# Patient Record
Sex: Female | Born: 1965 | Race: White | Hispanic: No | Marital: Single | Smoking: Current every day smoker
Health system: Southern US, Community
[De-identification: ages and names within clinical notes are randomized; demographics above are authoritative.]

## PROBLEM LIST (undated history)

## (undated) DIAGNOSIS — Q249 Congenital malformation of heart, unspecified: Secondary | ICD-10-CM

## (undated) DIAGNOSIS — I219 Acute myocardial infarction, unspecified: Secondary | ICD-10-CM

## (undated) DIAGNOSIS — N200 Calculus of kidney: Secondary | ICD-10-CM

## (undated) DIAGNOSIS — R011 Cardiac murmur, unspecified: Secondary | ICD-10-CM

## (undated) DIAGNOSIS — Q211 Atrial septal defect: Secondary | ICD-10-CM

## (undated) DIAGNOSIS — Q2119 Other specified atrial septal defect: Secondary | ICD-10-CM

## (undated) HISTORY — DX: Congenital malformation of heart, unspecified: Q24.9

## (undated) HISTORY — DX: Calculus of kidney: N20.0

## (undated) HISTORY — DX: Cardiac murmur, unspecified: R01.1

## (undated) HISTORY — PX: CORONARY ANGIOPLASTY: SHX604

## (undated) HISTORY — DX: Other specified atrial septal defect: Q21.19

## (undated) HISTORY — DX: Acute myocardial infarction, unspecified: I21.9

## (undated) HISTORY — PX: WISDOM TOOTH EXTRACTION: SHX21

## (undated) HISTORY — DX: Atrial septal defect: Q21.1

---

## 1997-12-28 HISTORY — PX: OTHER SURGICAL HISTORY: SHX169

## 2004-12-22 ENCOUNTER — Emergency Department: Payer: Self-pay | Admitting: Emergency Medicine

## 2004-12-22 ENCOUNTER — Other Ambulatory Visit: Payer: Self-pay

## 2005-12-28 LAB — CONVERTED CEMR LAB: Pap Smear: NORMAL

## 2006-03-04 ENCOUNTER — Ambulatory Visit: Payer: Self-pay

## 2006-03-18 ENCOUNTER — Emergency Department: Payer: Self-pay | Admitting: Internal Medicine

## 2006-03-18 ENCOUNTER — Other Ambulatory Visit: Payer: Self-pay

## 2009-05-20 ENCOUNTER — Emergency Department: Payer: Self-pay | Admitting: Emergency Medicine

## 2009-07-09 ENCOUNTER — Other Ambulatory Visit: Admission: RE | Admit: 2009-07-09 | Discharge: 2009-07-09 | Payer: Self-pay | Admitting: Family Medicine

## 2009-07-09 ENCOUNTER — Encounter: Payer: Self-pay | Admitting: Family Medicine

## 2009-07-09 ENCOUNTER — Ambulatory Visit: Payer: Self-pay | Admitting: Family Medicine

## 2009-07-09 DIAGNOSIS — Z87442 Personal history of urinary calculi: Secondary | ICD-10-CM

## 2009-07-09 DIAGNOSIS — N949 Unspecified condition associated with female genital organs and menstrual cycle: Secondary | ICD-10-CM | POA: Insufficient documentation

## 2009-07-09 DIAGNOSIS — R5381 Other malaise: Secondary | ICD-10-CM

## 2009-07-09 DIAGNOSIS — R5383 Other fatigue: Secondary | ICD-10-CM

## 2009-07-09 DIAGNOSIS — K219 Gastro-esophageal reflux disease without esophagitis: Secondary | ICD-10-CM

## 2009-07-09 DIAGNOSIS — F418 Other specified anxiety disorders: Secondary | ICD-10-CM | POA: Insufficient documentation

## 2009-07-10 LAB — CONVERTED CEMR LAB
Basophils Relative: 0.3 % (ref 0.0–3.0)
Chloride: 106 meq/L (ref 96–112)
Creatinine, Ser: 0.7 mg/dL (ref 0.4–1.2)
Direct LDL: 130.4 mg/dL
Eosinophils Relative: 0.7 % (ref 0.0–5.0)
Hemoglobin: 15.1 g/dL — ABNORMAL HIGH (ref 12.0–15.0)
Lymphocytes Relative: 18.4 % (ref 12.0–46.0)
MCV: 95 fL (ref 78.0–100.0)
Neutro Abs: 8.4 10*3/uL — ABNORMAL HIGH (ref 1.4–7.7)
Neutrophils Relative %: 74 % (ref 43.0–77.0)
RBC: 4.54 M/uL (ref 3.87–5.11)
Sodium: 139 meq/L (ref 135–145)
WBC: 11.4 10*3/uL — ABNORMAL HIGH (ref 4.5–10.5)

## 2009-07-11 LAB — CONVERTED CEMR LAB: Herpes Simplex Vrs I&II-IgM Ab (EIA): 0.44

## 2009-07-15 ENCOUNTER — Encounter (INDEPENDENT_AMBULATORY_CARE_PROVIDER_SITE_OTHER): Payer: Self-pay | Admitting: *Deleted

## 2009-10-16 ENCOUNTER — Telehealth: Payer: Self-pay | Admitting: Family Medicine

## 2010-05-22 ENCOUNTER — Telehealth: Payer: Self-pay | Admitting: Family Medicine

## 2010-08-11 ENCOUNTER — Telehealth: Payer: Self-pay | Admitting: Family Medicine

## 2011-01-27 NOTE — Progress Notes (Signed)
Summary: ? IUD removal  Phone Note Call from Patient Call back at Home Phone [phone]   Caller: Patient Call For: Angela Beat MD Summary of Call: Patient has an appt on 06/24/2010 for a CPX with Dr. Patsy Lager and wants to know if he can remove her IUD.  She says it is a 10 year IUD and she cannot feel the string at all.  Please advise.  She does not have a GYN. Initial call taken by: Linde Gillis CMA Duncan Dull),  May 22, 2010 4:32 PM  Follow-up for Phone Call        If she cannot feel the string at all, then I would rather have one of the GYN's remove it.   It is not hard to do, but without an easily seen string becomes harder.   Multiple GYN's that are nice. If [place], [place] [facility] is good. [facility] is nice. I think she lives in [place] -- [place] has GYN clinics, too. Follow-up by: Angela Beat MD,  May 22, 2010 7:27 PM  Additional Follow-up for Phone Call Additional follow up Details #1::        Spoke with patient and she says that she will let you do the pap first and see if you can see the string because everytime she had had it cheked they have been able to see it fine and if you cant do it then she would like the referral to gyn Additional Follow-up by: Benny Lennert CMA [facility] Dull),  May 23, 2010 7:43 AM    Additional Follow-up for Phone Call Additional follow up Details #2::    OK, I am fine with that. Please schedule. Follow-up by: Angela Beat MD,  May 26, 2010 12:20 PM  Additional Follow-up for Phone Call Additional follow up Details #3:: Details for Additional Follow-up Action Taken: already has appt Additional Follow-up by: Benny Lennert CMA [facility] Dull),  May 27, 2010 7:48 AM

## 2011-01-27 NOTE — Progress Notes (Signed)
Summary: wants IUD removed  Phone Note Call from Patient Call back at Home Phone [phone]   Caller: Patient Call For: Angela Beat MD Summary of Call: Pt wants referral to gyn for IUD removal, she wants this out.  Prefers to go to [place] Initial call taken by: Lowella Petties CMA,  August 11, 2010 3:57 PM  Follow-up for Phone Call        ok, referring to [facility] Follow-up by: Angela Beat MD,  August 11, 2010 4:01 PM  New Problems: INTRAUTERINE CONTRACEPTIVE DEVICE REMOVAL (ICD-V25.42)   New Problems: INTRAUTERINE CONTRACEPTIVE DEVICE REMOVAL (ICD-V25.42)

## 2015-10-03 ENCOUNTER — Ambulatory Visit (INDEPENDENT_AMBULATORY_CARE_PROVIDER_SITE_OTHER): Payer: BC Managed Care – PPO | Admitting: Family Medicine

## 2015-10-03 ENCOUNTER — Encounter: Payer: Self-pay | Admitting: Family Medicine

## 2015-10-03 VITALS — BP 113/75 | HR 82 | Temp 97.8°F | Resp 16 | Ht 62.0 in | Wt 180.2 lb

## 2015-10-03 DIAGNOSIS — Z720 Tobacco use: Secondary | ICD-10-CM

## 2015-10-03 DIAGNOSIS — J189 Pneumonia, unspecified organism: Secondary | ICD-10-CM

## 2015-10-03 DIAGNOSIS — J181 Lobar pneumonia, unspecified organism: Principal | ICD-10-CM

## 2015-10-03 DIAGNOSIS — F17200 Nicotine dependence, unspecified, uncomplicated: Secondary | ICD-10-CM | POA: Insufficient documentation

## 2015-10-03 MED ORDER — DOXYCYCLINE HYCLATE 100 MG PO CAPS: 100.0000 mg | ORAL_CAPSULE | Freq: Two times a day (BID) | ORAL | Status: DC

## 2015-10-03 MED ORDER — GUAIFENESIN-CODEINE 100-10 MG/5ML PO SOLN: 5.0000 mL | Freq: Four times a day (QID) | ORAL | Status: DC | PRN

## 2015-10-03 NOTE — Progress Notes (Signed)
Date:  10/03/2015   Name:  Angela Mccullough   DOB:  [birth year]   MRN:  [id number]  PCP:  Leata Mouse, NP    Chief Complaint: Cough   History of Present Illness:  This is a 49 y.o. female with 2 weeks of NP cough and wheezing, Ventolin MDI helps but using frequently, cough keeping up at night. Allergic Biaxin.  Review of Systems:  Review of Systems  Constitutional: Negative for fever.  HENT: Negative for rhinorrhea and sore throat.     Patient Active Problem List   Diagnosis Date Noted  . ANXIETY 07/09/2009  . DEPRESSION 07/09/2009  . GERD 07/09/2009  . VAGINITIS 07/09/2009  . DYSFUNCTIONAL UTERINE BLEEDING 07/09/2009  . FATIGUE 07/09/2009  . NEPHROLITHIASIS, HX OF 07/09/2009    Prior to Admission medications   Medication Sig Start Date End Date Taking? Authorizing Provider  albuterol (PROVENTIL) (2.5 MG/3ML) 0.083% nebulizer solution Take 2.5 mg by nebulization every 6 (six) hours as needed for wheezing or shortness of breath.   Yes Historical Provider, MD  cetirizine (ZYRTEC) 10 MG tablet Take 10 mg by mouth daily.   Yes Historical Provider, MD  diphenhydrAMINE (SOMINEX) 25 MG tablet Take 25 mg by mouth at bedtime as needed for sleep.   Yes Historical Provider, MD  doxycycline (VIBRAMYCIN) 100 MG capsule Take 1 capsule (100 mg total) by mouth 2 (two) times daily. 10/03/15   Adline Potter, MD  guaiFENesin-codeine 100-10 MG/5ML syrup Take 5 mLs by mouth every 6 (six) hours as needed for cough. 10/03/15   Adline Potter, MD    Allergies  Allergen Reactions  . Vioxx [Rofecoxib] Nausea Only    No past surgical history on file.  Social History  Substance Use Topics  . Smoking status: Current Every Day Smoker    Types: Cigarettes  . Smokeless tobacco: Not on file  . Alcohol Use: No    No family history on file.  Medication list has been reviewed and updated.  Physical Examination: BP 113/75 mmHg  Pulse 82  Temp(Src) 97.8 F (36.6 C) (Oral)  Resp 16  Ht 5\' 2"   (1.575 m)  Wt 180 lb 3.2 oz (81.738 kg)  BMI 32.95 kg/m2  SpO2 95%  Physical Exam  HENT:  Mouth/Throat: Oropharynx is clear and moist.  Pulmonary/Chest: Effort normal.  Rales RLL with R sided expiratory rhonchi    Assessment and Plan:  1. Right lower lobe pneumonia With bronchospasm - doxycycline (VIBRAMYCIN) 100 MG capsule; Take 1 capsule (100 mg total) by mouth 2 (two) times daily.  Dispense: 14 capsule; Refill: 0 - guaiFENesin-codeine 100-10 MG/5ML syrup; Take 5 mLs by mouth every 6 (six) hours as needed for cough.  Dispense: 120 mL; Refill: 0  2. Smoker Recommend cessation  Return if symptoms worsen or fail to improve.  Satira Anis. Chriss Mannan, [facility]  10/03/2015

## 2015-10-10 ENCOUNTER — Telehealth: Payer: Self-pay | Admitting: Family Medicine

## 2015-10-10 NOTE — Telephone Encounter (Signed)
Pt advised.

## 2015-10-10 NOTE — Telephone Encounter (Signed)
Pt was here last week with cough and congestion.  She will finish all medications today and still has a productive cough.  Please call [phone] and leave a message.  She doesn't get off work until 3:00.

## 2015-10-10 NOTE — Telephone Encounter (Signed)
If feeling better needs to give more time as cough is last symptom to resolve. If not feeling better needs office visit and CXR.

## 2015-10-10 NOTE — Telephone Encounter (Signed)
Please advise 

## 2015-10-14 ENCOUNTER — Encounter: Payer: Self-pay | Admitting: Family Medicine

## 2015-10-14 ENCOUNTER — Ambulatory Visit
Admission: RE | Admit: 2015-10-14 | Discharge: 2015-10-14 | Disposition: A | Discharge status: Home / Self Care | Payer: BC Managed Care – PPO | Source: Ambulatory Visit | Attending: Family Medicine | Admitting: Family Medicine

## 2015-10-14 ENCOUNTER — Ambulatory Visit (INDEPENDENT_AMBULATORY_CARE_PROVIDER_SITE_OTHER): Payer: BC Managed Care – PPO | Admitting: Family Medicine

## 2015-10-14 VITALS — BP 107/72 | HR 78 | Temp 98.4°F | Resp 16 | Ht 62.0 in | Wt 182.0 lb

## 2015-10-14 DIAGNOSIS — J189 Pneumonia, unspecified organism: Secondary | ICD-10-CM | POA: Insufficient documentation

## 2015-10-14 DIAGNOSIS — Z720 Tobacco use: Secondary | ICD-10-CM | POA: Diagnosis not present

## 2015-10-14 DIAGNOSIS — J181 Lobar pneumonia, unspecified organism: Principal | ICD-10-CM

## 2015-10-14 DIAGNOSIS — F17200 Nicotine dependence, unspecified, uncomplicated: Secondary | ICD-10-CM

## 2015-10-14 MED ORDER — PREDNISONE 20 MG PO TABS: 20.0000 mg | ORAL_TABLET | Freq: Every day | ORAL | Status: DC

## 2015-10-14 NOTE — Progress Notes (Signed)
Date:  10/14/2015   Name:  Angela Mccullough   DOB:  11-22-1966   MRN:  [id number]  PCP:  Leata Mouse, NP    Chief Complaint: Pneumonia   History of Present Illness:  This is a 49 y.o. female with clinical dx of RLL pneumonia 11 days ago rx'd with doxy x 7d. Overall better but still has NP cough, wheezing (using albuterol MDI regularly), and fatigue. Has cut back on smoking but not quit.  Review of Systems:  Review of Systems  Constitutional: Negative for fever and chills.  HENT: Negative for rhinorrhea and sore throat.   Cardiovascular: Negative for chest pain and leg swelling.    Patient Active Problem List   Diagnosis Date Noted  . Smoker 10/03/2015  . ANXIETY 07/09/2009  . DEPRESSION 07/09/2009  . GERD 07/09/2009  . VAGINITIS 07/09/2009  . DYSFUNCTIONAL UTERINE BLEEDING 07/09/2009  . FATIGUE 07/09/2009  . NEPHROLITHIASIS, HX OF 07/09/2009    Prior to Admission medications   Medication Sig Start Date End Date Taking? Authorizing Provider  albuterol (PROVENTIL) (2.5 MG/3ML) 0.083% nebulizer solution Take 2.5 mg by nebulization every 6 (six) hours as needed for wheezing or shortness of breath.   Yes Historical Provider, MD  cetirizine (ZYRTEC) 10 MG tablet Take 10 mg by mouth daily.   Yes Historical Provider, MD  doxycycline (VIBRAMYCIN) 100 MG capsule Take 1 capsule (100 mg total) by mouth 2 (two) times daily. 10/03/15  Yes Adline Potter, MD  guaiFENesin-codeine 100-10 MG/5ML syrup Take 5 mLs by mouth every 6 (six) hours as needed for cough. 10/03/15  Yes Adline Potter, MD  predniSONE (DELTASONE) 20 MG tablet Take 1 tablet (20 mg total) by mouth daily with breakfast. 10/14/15   Adline Potter, MD    Allergies  Allergen Reactions  . Biaxin [Clarithromycin] Nausea And Vomiting    No past surgical history on file.  Social History  Substance Use Topics  . Smoking status: Current Every Day Smoker    Types: Cigarettes  . Smokeless tobacco: Not on file  . Alcohol Use: No     No family history on file.  Medication list has been reviewed and updated.  Physical Examination: BP 107/72 mmHg  Pulse 78  Temp(Src) 98.4 F (36.9 C) (Oral)  Resp 16  Ht 5\' 2"  (1.575 m)  Wt 182 lb (82.555 kg)  BMI 33.28 kg/m2  SpO2 97%  Physical Exam  Constitutional: She appears well-developed and well-nourished.  HENT:  Mouth/Throat: Oropharynx is clear and moist.  Cardiovascular: Normal rate, regular rhythm and normal heart sounds.   Pulmonary/Chest: Effort normal and breath sounds normal.  Musculoskeletal: She exhibits no edema.  Lymphadenopathy:    She has no cervical adenopathy.  Neurological: She is alert.  Skin: Skin is warm and dry.  Psychiatric: She has a normal mood and affect. Her behavior is normal.  Nursing note and vitals reviewed.   Assessment and Plan:  1. Pneumonia of right lower lobe due to infectious organism CXR shows no active disease, persistent sxs likely due to residual inflammation, trial short course prednisone (tolerated in past) - DG Chest 2 View  2. Smoker Strongly advised cessation  Return if symptoms worsen or fail to improve.  Satira Anis. [facility]  10/14/2015

## 2015-11-08 ENCOUNTER — Encounter: Payer: BC Managed Care – PPO | Admitting: Family Medicine

## 2015-11-08 ENCOUNTER — Ambulatory Visit: Payer: BC Managed Care – PPO | Admitting: Family Medicine

## 2015-11-20 ENCOUNTER — Ambulatory Visit (INDEPENDENT_AMBULATORY_CARE_PROVIDER_SITE_OTHER): Payer: BC Managed Care – PPO | Admitting: Family Medicine

## 2015-11-20 ENCOUNTER — Encounter: Payer: Self-pay | Admitting: Family Medicine

## 2015-11-20 VITALS — BP 121/81 | HR 91 | Temp 98.1°F | Resp 16 | Ht 62.0 in | Wt 177.0 lb

## 2015-11-20 DIAGNOSIS — R05 Cough: Secondary | ICD-10-CM

## 2015-11-20 DIAGNOSIS — Z0000 Encounter for general adult medical examination without abnormal findings: Secondary | ICD-10-CM | POA: Diagnosis not present

## 2015-11-20 DIAGNOSIS — F4310 Post-traumatic stress disorder, unspecified: Secondary | ICD-10-CM | POA: Diagnosis not present

## 2015-11-20 DIAGNOSIS — Z23 Encounter for immunization: Secondary | ICD-10-CM | POA: Diagnosis not present

## 2015-11-20 DIAGNOSIS — R053 Chronic cough: Secondary | ICD-10-CM | POA: Insufficient documentation

## 2015-11-20 MED ORDER — FLUTICASONE PROPIONATE HFA 110 MCG/ACT IN AERO: 1.0000 | INHALATION_SPRAY | Freq: Two times a day (BID) | RESPIRATORY_TRACT | Status: DC

## 2015-11-20 MED ORDER — ALBUTEROL SULFATE HFA 108 (90 BASE) MCG/ACT IN AERS: 2.0000 | INHALATION_SPRAY | Freq: Four times a day (QID) | RESPIRATORY_TRACT | Status: DC | PRN

## 2015-11-20 NOTE — Progress Notes (Signed)
Name: Angela Mccullough   MRN: [id number]    DOB: July 13, 1966   Date:11/20/2015       Progress Note  Subjective  Chief Complaint  Chief Complaint  Patient presents with  . Annual Exam    HPI Here for annual physical exam.  Recently recovering from pneumonia.  Continues to smoke 3/4 ppd.Sees Dr. Randel Books at [facility] for PTSD.  Continues to follow.  She has seen GYN at [facility].  Haesn't been in  Several yrs.   No mammogram in  Several years.  No problem-specific assessment & plan notes found for this encounter.   History reviewed. No pertinent past medical history.  History reviewed. No pertinent past surgical history.  Family History  Problem Relation Age of Onset  . Diabetes Mother   . COPD Mother     Social History   Social History  . Marital Status: Single    Spouse Name: N/A  . Number of Children: N/A  . Years of Education: N/A   Occupational History  . Not on file.   Social History Main Topics  . Smoking status: Current Every Day Smoker    Types: Cigarettes  . Smokeless tobacco: Never Used  . Alcohol Use: No  . Drug Use: No  . Sexual Activity: Not on file   Other Topics Concern  . Not on file   Social History Narrative     Current outpatient prescriptions:  .  cetirizine (ZYRTEC) 10 MG tablet, Take 10 mg by mouth daily., Disp: , Rfl:  .  citalopram (CELEXA) 20 MG tablet, Take 20 mg by mouth daily., Disp: , Rfl:  .  diphenhydrAMINE (BENADRYL) 25 MG tablet, Take 25 mg by mouth every 4 (four) hours as needed., Disp: , Rfl:  .  albuterol (PROVENTIL HFA;VENTOLIN HFA) 108 (90 BASE) MCG/ACT inhaler, Inhale 2 puffs into the lungs every 6 (six) hours as needed for wheezing or shortness of breath., Disp: 1 Inhaler, Rfl: 12 .  fluticasone (FLOVENT HFA) 110 MCG/ACT inhaler, Inhale 1 puff into the lungs 2 (two) times daily., Disp: 1 Inhaler, Rfl: 12  Allergies  Allergen Reactions  . Biaxin [Clarithromycin] Nausea And Vomiting     Review of Systems   Constitutional: Positive for malaise/fatigue (tired "all the time"). Negative for fever, chills and weight loss.  HENT: Negative for hearing loss.   Eyes: Negative for blurred vision and double vision.  Respiratory: Positive for cough, shortness of breath and wheezing. Sputum production: +/-, clear.   Cardiovascular: Negative for chest pain, palpitations and leg swelling.  Gastrointestinal: Negative for heartburn, abdominal pain and blood in stool.  Genitourinary: Negative for dysuria, urgency and frequency.  Skin: Negative for rash.  Neurological: Negative for dizziness, tremors, weakness and headaches.  Psychiatric/Behavioral: Positive for depression (PTSD).      Objective  Filed Vitals:   11/20/15 1459  BP: 121/81  Pulse: 91  Temp: 98.1 F (36.7 C)  TempSrc: Oral  Resp: 16  Height: 5\' 2"  (1.575 m)  Weight: 177 lb (80.287 kg)  SpO2: 97%    Physical Exam  Constitutional: She is oriented to person, place, and time and well-developed, well-nourished, and in no distress. No distress.  HENT:  Head: Normocephalic and atraumatic.  Right Ear: External ear normal.  Left Ear: External ear normal.  Nose: Nose normal.  Eyes: Conjunctivae and EOM are normal. Pupils are equal, round, and reactive to light. No scleral icterus.  Neck: Normal range of motion. Neck supple. Carotid bruit is not present. No  thyromegaly present.  Cardiovascular: Normal rate, regular rhythm, normal heart sounds and intact distal pulses.  Exam reveals no gallop and no friction rub.   No murmur heard. Pulmonary/Chest: Effort normal and breath sounds normal. No respiratory distress. She has no wheezes. She has no rales. Right breast exhibits no inverted nipple, no mass, no nipple discharge, no skin change and no tenderness. Left breast exhibits no inverted nipple, no mass, no nipple discharge, no skin change and no tenderness. Breasts are symmetrical.  Abdominal: Soft. She exhibits no distension, no abdominal  bruit and no mass. There is no tenderness.  Musculoskeletal: Normal range of motion. She exhibits no edema.  Lymphadenopathy:    She has no cervical adenopathy.  Neurological: She is alert and oriented to person, place, and time.  Psychiatric: Mood, memory, affect and judgment normal.  Vitals reviewed.      No results found for this or any previous visit (from the past 2160 hour(s)).   Assessment & Plan  Problem List Items Addressed This Visit      Other   PTSD (post-traumatic stress disorder)   Relevant Orders   Comprehensive Metabolic Panel (CMET)   Routine health maintenance - Primary   Relevant Orders   MM Digital Screening   Lipid Profile   CBC with Differential   TSH   Comprehensive Metabolic Panel (CMET)   Chronic cough   Relevant Medications   fluticasone (FLOVENT HFA) 110 MCG/ACT inhaler   albuterol (PROVENTIL HFA;VENTOLIN HFA) 108 (90 BASE) MCG/ACT inhaler    Other Visit Diagnoses    Need for influenza vaccination        Relevant Orders    Flu Vaccine QUAD 36+ mos PF IM (Fluarix & Fluzone Quad PF)       Meds ordered this encounter  Medications  . citalopram (CELEXA) 20 MG tablet    Sig: Take 20 mg by mouth daily.  . diphenhydrAMINE (BENADRYL) 25 MG tablet    Sig: Take 25 mg by mouth every 4 (four) hours as needed.  Marland Kitchen DISCONTD: albuterol (PROVENTIL) (2.5 MG/3ML) 0.083% nebulizer solution    Sig: Take 2.5 mg by nebulization every 6 (six) hours as needed for wheezing or shortness of breath.  . fluticasone (FLOVENT HFA) 110 MCG/ACT inhaler    Sig: Inhale 1 puff into the lungs 2 (two) times daily.    Dispense:  1 Inhaler    Refill:  12  . albuterol (PROVENTIL HFA;VENTOLIN HFA) 108 (90 BASE) MCG/ACT inhaler    Sig: Inhale 2 puffs into the lungs every 6 (six) hours as needed for wheezing or shortness of breath.    Dispense:  1 Inhaler    Refill:  12   \1. Routine health maintenance  - MM Digital Screening; Future - Lipid Profile - CBC with  Differential - TSH - Comprehensive Metabolic Panel (CMET)  2. Chronic cough  - fluticasone (FLOVENT HFA) 110 MCG/ACT inhaler; Inhale 1 puff into the lungs 2 (two) times daily.  Dispense: 1 Inhaler; Refill: 12 - albuterol (PROVENTIL HFA;VENTOLIN HFA) 108 (90 BASE) MCG/ACT inhaler; Inhale 2 puffs into the lungs every 6 (six) hours as needed for wheezing or shortness of breath.  Dispense: 1 Inhaler; Refill: 12  3. PTSD (post-traumatic stress disorder)  - Comprehensive Metabolic Panel (CMET)

## 2015-11-20 NOTE — Patient Instructions (Signed)
Discussed stopping smoking in some detail for >5 min.

## 2015-11-26 LAB — CBC WITH DIFFERENTIAL/PLATELET
BASOS ABS: 0 10*3/uL (ref 0.0–0.2)
BASOS: 1 %
EOS (ABSOLUTE): 0.3 10*3/uL (ref 0.0–0.4)
Eos: 4 %
Hematocrit: 38.8 % (ref 34.0–46.6)
Hemoglobin: 13.4 g/dL (ref 11.1–15.9)
IMMATURE GRANS (ABS): 0 10*3/uL (ref 0.0–0.1)
IMMATURE GRANULOCYTES: 0 %
LYMPHS: 35 %
Lymphocytes Absolute: 3 10*3/uL (ref 0.7–3.1)
MCH: 31.8 pg (ref 26.6–33.0)
MCHC: 34.5 g/dL (ref 31.5–35.7)
MCV: 92 fL (ref 79–97)
Monocytes Absolute: 0.7 10*3/uL (ref 0.1–0.9)
Monocytes: 9 %
NEUTROS PCT: 51 %
Neutrophils Absolute: 4.3 10*3/uL (ref 1.4–7.0)
PLATELETS: 487 10*3/uL — AB (ref 150–379)
RBC: 4.21 x10E6/uL (ref 3.77–5.28)
RDW: 12.7 % (ref 12.3–15.4)
WBC: 8.3 10*3/uL (ref 3.4–10.8)

## 2015-11-27 LAB — COMPREHENSIVE METABOLIC PANEL
ALBUMIN: 4.3 g/dL (ref 3.5–5.5)
ALK PHOS: 96 IU/L (ref 39–117)
ALT: 17 IU/L (ref 0–32)
AST: 15 IU/L (ref 0–40)
Albumin/Globulin Ratio: 1.9 (ref 1.1–2.5)
BUN / CREAT RATIO: 20 (ref 9–23)
BUN: 13 mg/dL (ref 6–24)
Bilirubin Total: 0.3 mg/dL (ref 0.0–1.2)
CALCIUM: 10 mg/dL (ref 8.7–10.2)
CO2: 25 mmol/L (ref 18–29)
CREATININE: 0.65 mg/dL (ref 0.57–1.00)
Chloride: 103 mmol/L (ref 97–106)
GFR, EST AFRICAN AMERICAN: 121 mL/min/{1.73_m2} (ref >59)
GFR, EST NON AFRICAN AMERICAN: 105 mL/min/{1.73_m2} (ref >59)
GLOBULIN, TOTAL: 2.3 g/dL (ref 1.5–4.5)
GLUCOSE: 87 mg/dL (ref 65–99)
Potassium: 4.8 mmol/L (ref 3.5–5.2)
SODIUM: 143 mmol/L (ref 136–144)
TOTAL PROTEIN: 6.6 g/dL (ref 6.0–8.5)

## 2015-11-27 LAB — TSH: TSH: 3.11 u[IU]/mL (ref 0.450–4.500)

## 2015-11-27 LAB — LIPID PANEL
CHOLESTEROL TOTAL: 214 mg/dL — AB (ref 100–199)
Chol/HDL Ratio: 3.4 ratio units (ref 0.0–4.4)
HDL: 63 mg/dL (ref >39)
LDL Calculated: 128 mg/dL — ABNORMAL HIGH (ref 0–99)
TRIGLYCERIDES: 115 mg/dL (ref 0–149)
VLDL Cholesterol Cal: 23 mg/dL (ref 5–40)

## 2015-11-28 ENCOUNTER — Telehealth: Payer: Self-pay | Admitting: Family Medicine

## 2015-11-28 NOTE — Telephone Encounter (Signed)
Pt return call pt call back # is  [phone]

## 2015-11-29 NOTE — Telephone Encounter (Signed)
Patient aware of results.[place] 

## 2015-12-12 ENCOUNTER — Ambulatory Visit (INDEPENDENT_AMBULATORY_CARE_PROVIDER_SITE_OTHER): Payer: BC Managed Care – PPO | Admitting: Family Medicine

## 2015-12-12 VITALS — BP 109/73 | HR 82 | Temp 97.7°F | Resp 16 | Ht 67.0 in | Wt 179.0 lb

## 2015-12-12 DIAGNOSIS — H109 Unspecified conjunctivitis: Secondary | ICD-10-CM

## 2015-12-12 DIAGNOSIS — J069 Acute upper respiratory infection, unspecified: Secondary | ICD-10-CM | POA: Diagnosis not present

## 2015-12-12 MED ORDER — OFLOXACIN 0.3 % OP SOLN: 2.0000 [drp] | Freq: Four times a day (QID) | OPHTHALMIC | Status: DC

## 2015-12-12 NOTE — Progress Notes (Signed)
Subjective:    Patient ID: Angela Mccullough, female    DOB: [birth year], 49 y.o.   MRN: [id number]  HPI: Angela Mccullough is a 49 y.o. female presenting on 12/12/2015 for Conjunctivitis   Conjunctivitis  The current episode started yesterday. The problem occurs continuously. The problem has been gradually worsening. Associated symptoms include eye itching, congestion, headaches, rhinorrhea, URI, eye discharge and eye redness. Pertinent negatives include no double vision, no photophobia, no diarrhea, no vomiting, no ear discharge, no ear pain, no sore throat, no swollen glands, no cough and no eye pain.    Pt presents for possible Pink Eye. 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 Symptoms began yesterday with L eye itching and crusting. She has taken out her contacts. No visual changes or blurred vision.   Pt also has URI symptoms for about 1 week. Symptoms appear to be improving. Cough and sinus drainage at night. No chest tightness or shortness of breath. No facial pain, some sinus pressure. No fevers. She is treating congestion with dayquil and nyquil with moderate relief.    No past medical history on file.  Current Outpatient Prescriptions on File Prior to Visit  Medication Sig  . albuterol (PROVENTIL HFA;VENTOLIN HFA) 108 (90 BASE) MCG/ACT inhaler Inhale 2 puffs into the lungs every 6 (six) hours as needed for wheezing or shortness of breath.  . cetirizine (ZYRTEC) 10 MG tablet Take 10 mg by mouth daily.  . citalopram (CELEXA) 20 MG tablet Take 20 mg by mouth daily.  . diphenhydrAMINE (BENADRYL) 25 MG tablet Take 25 mg by mouth every 4 (four) hours as needed.  . fluticasone (FLOVENT HFA) 110 MCG/ACT inhaler Inhale 1 puff into the lungs 2 (two) times daily.   No current facility-administered medications on file prior to visit.    Review of Systems  HENT: Positive for congestion, rhinorrhea and sinus pressure. Negative for ear discharge, ear pain,  sore throat, trouble swallowing and voice change.   Eyes: Positive for discharge, redness and itching. Negative for double vision, photophobia, pain and visual disturbance.  Respiratory: Negative for cough, chest tightness and shortness of breath.   Cardiovascular: Negative for chest pain, palpitations and leg swelling.  Gastrointestinal: Negative for vomiting and diarrhea.  Neurological: Positive for headaches.   Per HPI unless specifically indicated above     Objective:    BP 109/73 mmHg  Pulse 82  Temp(Src) 97.7 F (36.5 C) (Oral)  Resp 16  Ht 5\' 7"  (1.702 m)  Wt 179 lb (81.194 kg)  BMI 28.03 kg/m2  Wt Readings from Last 3 Encounters:  12/12/15 179 lb (81.194 kg)  11/20/15 177 lb (80.287 kg)  10/14/15 182 lb (82.555 kg)    Physical Exam  HENT:  Head: Normocephalic and atraumatic.  Right Ear: Hearing normal. Tympanic membrane is not erythematous and not bulging.  Left Ear: Hearing and tympanic membrane normal. Tympanic membrane is not erythematous and not bulging.  Nose: Mucosal edema and rhinorrhea present. Right sinus exhibits no maxillary sinus tenderness and no frontal sinus tenderness. Left sinus exhibits no maxillary sinus tenderness and no frontal sinus tenderness.  Mouth/Throat: Uvula is midline, oropharynx is clear and moist and mucous membranes are normal.  Eyes: EOM are normal. Pupils are equal, round, and reactive to light. Right eye exhibits no exudate. Left eye exhibits exudate. Left conjunctiva is injected. Left conjunctiva has no hemorrhage. Right eye exhibits normal extraocular motion and no nystagmus. Left eye exhibits normal  extraocular motion and no nystagmus.     Results for orders placed or performed in visit on 11/20/15  Lipid Profile  Result Value Ref Range   Cholesterol, Total 214 (H) 100 - 199 mg/dL   Triglycerides 115 0 - 149 mg/dL   HDL 63 >39 mg/dL   VLDL Cholesterol Cal 23 5 - 40 mg/dL   LDL Calculated 128 (H) 0 - 99 mg/dL   Chol/HDL Ratio  3.4 0.0 - 4.4 ratio units  CBC with Differential  Result Value Ref Range   WBC 8.3 3.4 - 10.8 x10E3/uL   RBC 4.21 3.77 - 5.28 x10E6/uL   Hemoglobin 13.4 11.1 - 15.9 g/dL   Hematocrit 38.8 34.0 - 46.6 %   MCV 92 79 - 97 fL   MCH 31.8 26.6 - 33.0 pg   MCHC 34.5 31.5 - 35.7 g/dL   RDW 12.7 12.3 - 15.4 %   Platelets 487 (H) 150 - 379 x10E3/uL   Neutrophils 51 %   Lymphs 35 %   Monocytes 9 %   Eos 4 %   Basos 1 %   Neutrophils Absolute 4.3 1.4 - 7.0 x10E3/uL   Lymphocytes Absolute 3.0 0.7 - 3.1 x10E3/uL   Monocytes Absolute 0.7 0.1 - 0.9 x10E3/uL   EOS (ABSOLUTE) 0.3 0.0 - 0.4 x10E3/uL   Basophils Absolute 0.0 0.0 - 0.2 x10E3/uL   Immature Granulocytes 0 %   Immature Grans (Abs) 0.0 0.0 - 0.1 x10E3/uL   Hematology Comments: Note:   TSH  Result Value Ref Range   TSH 3.110 0.450 - 4.500 uIU/mL  Comprehensive Metabolic Panel (CMET)  Result Value Ref Range   Glucose 87 65 - 99 mg/dL   BUN 13 6 - 24 mg/dL   Creatinine, Ser 0.65 0.57 - 1.00 mg/dL   GFR calc non Af Amer 105 >59 mL/min/1.73   GFR calc Af Amer 121 >59 mL/min/1.73   BUN/Creatinine Ratio 20 9 - 23   Sodium 143 136 - 144 mmol/L   Potassium 4.8 3.5 - 5.2 mmol/L   Chloride 103 97 - 106 mmol/L   CO2 25 18 - 29 mmol/L   Calcium 10.0 8.7 - 10.2 mg/dL   Total Protein 6.6 6.0 - 8.5 g/dL   Albumin 4.3 3.5 - 5.5 g/dL   Globulin, Total 2.3 1.5 - 4.5 g/dL   Albumin/Globulin Ratio 1.9 1.1 - 2.5   Bilirubin Total 0.3 0.0 - 1.2 mg/dL   Alkaline Phosphatase 96 39 - 117 IU/L   AST 15 0 - 40 IU/L   ALT 17 0 - 32 IU/L      Assessment & Plan:   Problem List Items Addressed This Visit    None    Visit Diagnoses    Conjunctivitis, left eye    -  Primary    Treat with antibiotics. Alarm symptoms reviewed. No contacts x 5 days. Alarm symptoms reviewed.     Relevant Medications    ofloxacin (OCUFLOX) 0.3 % ophthalmic solution    Upper respiratory infection        Symptoms viral in etiology- improving. Supportive care However  with 2 weeks of persistent symptoms or second worsening we can treat for sinus infection.        Meds ordered this encounter  Medications  . ofloxacin (OCUFLOX) 0.3 % ophthalmic solution    Sig: Place 2 drops into the left eye 4 (four) times daily.    Dispense:  5 mL    Refill:  0    Order  Specific Question:  Supervising Provider    Answer:  Arlis Porta [id number]      Follow up plan: Return if symptoms worsen or fail to improve.

## 2015-12-12 NOTE — Patient Instructions (Signed)

## 2016-01-20 ENCOUNTER — Ambulatory Visit (INDEPENDENT_AMBULATORY_CARE_PROVIDER_SITE_OTHER): Payer: BC Managed Care – PPO | Admitting: Family Medicine

## 2016-01-20 ENCOUNTER — Encounter: Payer: Self-pay | Admitting: Family Medicine

## 2016-01-20 VITALS — BP 106/71 | HR 73 | Temp 98.1°F | Resp 16 | Ht 62.0 in | Wt 182.6 lb

## 2016-01-20 DIAGNOSIS — R5382 Chronic fatigue, unspecified: Secondary | ICD-10-CM

## 2016-01-20 DIAGNOSIS — M2560 Stiffness of unspecified joint, not elsewhere classified: Secondary | ICD-10-CM

## 2016-01-20 DIAGNOSIS — K5900 Constipation, unspecified: Secondary | ICD-10-CM | POA: Insufficient documentation

## 2016-01-20 DIAGNOSIS — K5901 Slow transit constipation: Secondary | ICD-10-CM | POA: Diagnosis not present

## 2016-01-20 DIAGNOSIS — E063 Autoimmune thyroiditis: Secondary | ICD-10-CM

## 2016-01-20 DIAGNOSIS — R6889 Other general symptoms and signs: Secondary | ICD-10-CM

## 2016-01-20 MED ORDER — PSYLLIUM 28.3 % PO POWD: 500.0000 mg | Freq: Every day | ORAL | Status: DC

## 2016-01-20 MED ORDER — CHOLECALCIFEROL 50 MCG (2000 UT) PO CAPS: 1.0000 | ORAL_CAPSULE | Freq: Every day | ORAL | Status: DC

## 2016-01-20 NOTE — Progress Notes (Signed)
Subjective:    Patient ID: Angela Mccullough, female    DOB: 06-07-1966, 50 y.o.   MRN: [id number]  HPI: Angela Mccullough is a 50 y.o. female presenting on 01/20/2016 for Thyroid Problem   HPI  Pt is concerned about thyroid problems. Strong family history of Hashimotos. Fatigue, weight gain, cold intolerance. Joints hurt- bilateral both sides. Morning stiffness- hands and elbows. Feet swelling.   Constipation- 1 Bm per week. Tried increasing fluids and fiber. Mild improvement. Does not take stool softener.    No past medical history on file.  Current Outpatient Prescriptions on File Prior to Visit  Medication Sig  . albuterol (PROVENTIL HFA;VENTOLIN HFA) 108 (90 BASE) MCG/ACT inhaler Inhale 2 puffs into the lungs every 6 (six) hours as needed for wheezing or shortness of breath.  . cetirizine (ZYRTEC) 10 MG tablet Take 10 mg by mouth daily.  . citalopram (CELEXA) 20 MG tablet Take 20 mg by mouth daily.  . diphenhydrAMINE (BENADRYL) 25 MG tablet Take 25 mg by mouth every 4 (four) hours as needed.  . fluticasone (FLOVENT HFA) 110 MCG/ACT inhaler Inhale 1 puff into the lungs 2 (two) times daily.   No current facility-administered medications on file prior to visit.    Review of Systems  Constitutional: Positive for fatigue and unexpected weight change. Negative for fever and chills.  HENT: Negative.   Respiratory: Negative for cough, chest tightness and wheezing.   Cardiovascular: Negative for chest pain and leg swelling.  Gastrointestinal: Negative for nausea, vomiting, abdominal pain, diarrhea and constipation.  Endocrine: Positive for cold intolerance. Negative for heat intolerance, polydipsia, polyphagia and polyuria.  Genitourinary: Negative for dysuria and difficulty urinating.  Musculoskeletal: Positive for joint swelling and arthralgias.  Neurological: Negative for dizziness, light-headedness and numbness.  Psychiatric/Behavioral: Negative.    Per HPI unless  specifically indicated above     Objective:    BP 106/71 mmHg  Pulse 73  Temp(Src) 98.1 F (36.7 C) (Oral)  Resp 16  Ht '5\' 2"'  (1.575 m)  Wt 182 lb 9.6 oz (82.827 kg)  BMI 33.39 kg/m2  Wt Readings from Last 3 Encounters:  01/20/16 182 lb 9.6 oz (82.827 kg)  12/12/15 179 lb (81.194 kg)  11/20/15 177 lb (80.287 kg)    Physical Exam  Constitutional: She is oriented to person, place, and time. She appears well-developed and well-nourished.  HENT:  Head: Normocephalic and atraumatic.  Neck: Neck supple.  Cardiovascular: Normal rate, regular rhythm and normal heart sounds.  Exam reveals no gallop and no friction rub.   No murmur heard. Pulmonary/Chest: Effort normal and breath sounds normal. She has no wheezes. She exhibits no tenderness.  Abdominal: Soft. Normal appearance and bowel sounds are normal. She exhibits no distension and no mass. There is no tenderness. There is no rebound and no guarding.  Musculoskeletal: Normal range of motion. She exhibits no edema or tenderness.  Lymphadenopathy:    She has no cervical adenopathy.  Neurological: She is alert and oriented to person, place, and time.  Skin: Skin is warm and dry.  Psychiatric: She has a normal mood and affect. Her behavior is normal. Judgment and thought content normal.   Results for orders placed or performed in visit on 11/20/15  Lipid Profile  Result Value Ref Range   Cholesterol, Total 214 (H) 100 - 199 mg/dL   Triglycerides 115 0 - 149 mg/dL   HDL 63 >39 mg/dL   VLDL Cholesterol Cal 23 5 - 40 mg/dL   LDL  Calculated 128 (H) 0 - 99 mg/dL   Chol/HDL Ratio 3.4 0.0 - 4.4 ratio units  CBC with Differential  Result Value Ref Range   WBC 8.3 3.4 - 10.8 x10E3/uL   RBC 4.21 3.77 - 5.28 x10E6/uL   Hemoglobin 13.4 11.1 - 15.9 g/dL   Hematocrit 38.8 34.0 - 46.6 %   MCV 92 79 - 97 fL   MCH 31.8 26.6 - 33.0 pg   MCHC 34.5 31.5 - 35.7 g/dL   RDW 12.7 12.3 - 15.4 %   Platelets 487 (H) 150 - 379 x10E3/uL   Neutrophils  51 %   Lymphs 35 %   Monocytes 9 %   Eos 4 %   Basos 1 %   Neutrophils Absolute 4.3 1.4 - 7.0 x10E3/uL   Lymphocytes Absolute 3.0 0.7 - 3.1 x10E3/uL   Monocytes Absolute 0.7 0.1 - 0.9 x10E3/uL   EOS (ABSOLUTE) 0.3 0.0 - 0.4 x10E3/uL   Basophils Absolute 0.0 0.0 - 0.2 x10E3/uL   Immature Granulocytes 0 %   Immature Grans (Abs) 0.0 0.0 - 0.1 x10E3/uL   Hematology Comments: Note:   TSH  Result Value Ref Range   TSH 3.110 0.450 - 4.500 uIU/mL  Comprehensive Metabolic Panel (CMET)  Result Value Ref Range   Glucose 87 65 - 99 mg/dL   BUN 13 6 - 24 mg/dL   Creatinine, Ser 0.65 0.57 - 1.00 mg/dL   GFR calc non Af Amer 105 >59 mL/min/1.73   GFR calc Af Amer 121 >59 mL/min/1.73   BUN/Creatinine Ratio 20 9 - 23   Sodium 143 136 - 144 mmol/L   Potassium 4.8 3.5 - 5.2 mmol/L   Chloride 103 97 - 106 mmol/L   CO2 25 18 - 29 mmol/L   Calcium 10.0 8.7 - 10.2 mg/dL   Total Protein 6.6 6.0 - 8.5 g/dL   Albumin 4.3 3.5 - 5.5 g/dL   Globulin, Total 2.3 1.5 - 4.5 g/dL   Albumin/Globulin Ratio 1.9 1.1 - 2.5   Bilirubin Total 0.3 0.0 - 1.2 mg/dL   Alkaline Phosphatase 96 39 - 117 IU/L   AST 15 0 - 40 IU/L   ALT 17 0 - 32 IU/L      Assessment & Plan:   Problem List Items Addressed This Visit      Other   Chronic fatigue    Check TSH and Vitamin D. Recommend taking vitamin D daily to help increase energy.  If labs are normal: consider B12      Relevant Medications   Cholecalciferol 2000 units CAPS   Other Relevant Orders   VITAMIN D 25 Hydroxy (Vit-D Deficiency, Fractures)    Other Visit Diagnoses    Cold intolerance    -  Primary    Relevant Orders    CBC with Differential    Joint stiffness of multiple sites        Given family history of autoimmune- rule out immune mediated arthritis.  ANA, RA, ESR, CBC.     Relevant Orders    Sed Rate (ESR)    Rheumatoid Factor    Antinuclear Antib (ANA)    Hashimoto's thyroiditis        Recheck thyroid function. Check thyroid antibodies  due to family history.     Relevant Orders    Thyroid antibodies    TSH + free T4    T3, free       Meds ordered this encounter  Medications  . Cholecalciferol 2000 units CAPS  Sig: Take 1 capsule (2,000 Units total) by mouth daily.    Dispense:  30 each    Order Specific Question:  Supervising Provider    Answer:  Arlis Porta [phone]  . Psyllium 28.3 % POWD    Sig: Take 500 mg by mouth daily.    Dispense:  283 g    Order Specific Question:  Supervising Provider    Answer:  Arlis Porta [[id number]]      Follow up plan: Return in about 4 weeks (around 02/17/2016) for labwork.Marland Kitchen

## 2016-01-20 NOTE — Assessment & Plan Note (Signed)
Check TSH and Vitamin D. Recommend taking vitamin D daily to help increase energy.  If labs are normal: consider B12

## 2016-01-20 NOTE — Assessment & Plan Note (Signed)
Benefiber nightly to help with constipation. Consider Amitiza if not improving. Increase diet fluids and fiber.

## 2016-01-20 NOTE — Patient Instructions (Addendum)
We will check some labs to see if we can find a route cause of your symptoms.  I will get in touch with you regarding the lab work and we will go from there.   Constipation, Adult Constipation is when a person has fewer than three bowel movements a week, has difficulty having a bowel movement, or has stools that are dry, hard, or larger than normal. As people grow older, constipation is more common. A low-fiber diet, not taking in enough fluids, and taking certain medicines may make constipation worse.  CAUSES   Certain medicines, such as antidepressants, pain medicine, iron supplements, antacids, and water pills.   Certain diseases, such as diabetes, irritable bowel syndrome (IBS), thyroid disease, or depression.   Not drinking enough water.   Not eating enough fiber-rich foods.   Stress or travel.   Lack of physical activity or exercise.   Ignoring the urge to have a bowel movement.   Using laxatives too much.  SIGNS AND SYMPTOMS   Having fewer than three bowel movements a week.   Straining to have a bowel movement.   Having stools that are hard, dry, or larger than normal.   Feeling full or bloated.   Pain in the lower abdomen.   Not feeling relief after having a bowel movement.  DIAGNOSIS  Your health care provider will take a medical history and perform a physical exam. Further testing may be done for severe constipation. Some tests may include:  A barium enema X-ray to examine your rectum, colon, and, sometimes, your small intestine.   A sigmoidoscopy to examine your lower colon.   A colonoscopy to examine your entire colon. TREATMENT  Treatment will depend on the severity of your constipation and what is causing it. Some dietary treatments include drinking more fluids and eating more fiber-rich foods. Lifestyle treatments may include regular exercise. If these diet and lifestyle recommendations do not help, your health care provider may recommend  taking over-the-counter laxative medicines to help you have bowel movements. Prescription medicines may be prescribed if over-the-counter medicines do not work.  HOME CARE INSTRUCTIONS   Eat foods that have a lot of fiber, such as fruits, vegetables, whole grains, and beans.  Limit foods high in fat and processed sugars, such as french fries, hamburgers, cookies, candies, and soda.   A fiber supplement may be added to your diet if you cannot get enough fiber from foods.   Drink enough fluids to keep your urine clear or pale yellow.   Exercise regularly or as directed by your health care provider.   Go to the restroom when you have the urge to go. Do not hold it.   Only take over-the-counter or prescription medicines as directed by your health care provider. Do not take other medicines for constipation without talking to your health care provider first.  [place] IF:   You have bright red blood in your stool.   Your constipation lasts for more than 4 days or gets worse.   You have abdominal or rectal pain.   You have thin, pencil-like stools.   You have unexplained weight loss. MAKE SURE YOU:   Understand these instructions.  Will watch your condition.  Will get help right away if you are not doing well or get worse.   This information is not intended to replace advice given to you by your health care provider. Make sure you discuss any questions you have with your health care provider.  Document Released: 09/11/2004 Document Revised: 01/04/2015 Document Reviewed: 09/25/2013 Elsevier Interactive Patient Education [employer].

## 2016-01-22 ENCOUNTER — Other Ambulatory Visit: Payer: Self-pay | Admitting: Family Medicine

## 2016-01-22 DIAGNOSIS — E559 Vitamin D deficiency, unspecified: Secondary | ICD-10-CM

## 2016-01-22 LAB — ANA: Anti Nuclear Antibody(ANA): NEGATIVE

## 2016-01-22 LAB — CBC WITH DIFFERENTIAL/PLATELET
BASOS ABS: 0 10*3/uL (ref 0.0–0.2)
Basos: 0 %
EOS (ABSOLUTE): 0.2 10*3/uL (ref 0.0–0.4)
Eos: 3 %
HEMOGLOBIN: 12.2 g/dL (ref 11.1–15.9)
Hematocrit: 36.8 % (ref 34.0–46.6)
Immature Grans (Abs): 0 10*3/uL (ref 0.0–0.1)
Immature Granulocytes: 0 %
LYMPHS ABS: 3.6 10*3/uL — AB (ref 0.7–3.1)
Lymphs: 42 %
MCH: 30.7 pg (ref 26.6–33.0)
MCHC: 33.2 g/dL (ref 31.5–35.7)
MCV: 93 fL (ref 79–97)
MONOS ABS: 0.6 10*3/uL (ref 0.1–0.9)
Monocytes: 7 %
NEUTROS ABS: 4.2 10*3/uL (ref 1.4–7.0)
Neutrophils: 48 %
PLATELETS: 520 10*3/uL — AB (ref 150–379)
RBC: 3.98 x10E6/uL (ref 3.77–5.28)
RDW: 13 % (ref 12.3–15.4)
WBC: 8.7 10*3/uL (ref 3.4–10.8)

## 2016-01-22 LAB — VITAMIN D 25 HYDROXY (VIT D DEFICIENCY, FRACTURES): VIT D 25 HYDROXY: 11.3 ng/mL — AB (ref 30.0–100.0)

## 2016-01-22 LAB — THYROID ANTIBODIES: THYROID PEROXIDASE ANTIBODY: 8 [IU]/mL (ref 0–34)

## 2016-01-22 LAB — TSH+FREE T4
FREE T4: 1.25 ng/dL (ref 0.82–1.77)
TSH: 3.48 u[IU]/mL (ref 0.450–4.500)

## 2016-01-22 LAB — RHEUMATOID FACTOR: Rhuematoid fact SerPl-aCnc: <10 IU/mL (ref 0.0–13.9)

## 2016-01-22 LAB — SEDIMENTATION RATE: SED RATE: 19 mm/h (ref 0–32)

## 2016-01-22 LAB — T3, FREE: T3, Free: 2.8 pg/mL (ref 2.0–4.4)

## 2016-01-22 MED ORDER — VITAMIN D (ERGOCALCIFEROL) 1.25 MG (50000 UNIT) PO CAPS: 50000.0000 [IU] | ORAL_CAPSULE | ORAL | Status: DC

## 2016-01-23 LAB — IRON AND TIBC
IRON SATURATION: 27 % (ref 15–55)
IRON: 76 ug/dL (ref 27–159)
Total Iron Binding Capacity: 278 ug/dL (ref 250–450)
UIBC: 202 ug/dL (ref 131–425)

## 2016-01-23 LAB — FERRITIN: FERRITIN: 94 ng/mL (ref 15–150)

## 2016-01-23 LAB — SPECIMEN STATUS REPORT

## 2016-03-04 ENCOUNTER — Ambulatory Visit (INDEPENDENT_AMBULATORY_CARE_PROVIDER_SITE_OTHER): Payer: BC Managed Care – PPO | Admitting: Family Medicine

## 2016-03-04 ENCOUNTER — Encounter: Payer: Self-pay | Admitting: Family Medicine

## 2016-03-04 VITALS — BP 107/62 | HR 92 | Temp 98.0°F | Resp 16 | Ht 62.0 in | Wt 178.0 lb

## 2016-03-04 DIAGNOSIS — N390 Urinary tract infection, site not specified: Secondary | ICD-10-CM

## 2016-03-04 DIAGNOSIS — R319 Hematuria, unspecified: Secondary | ICD-10-CM

## 2016-03-04 LAB — POCT URINALYSIS DIPSTICK
Bilirubin, UA: NEGATIVE
Glucose, UA: NEGATIVE
KETONES UA: NEGATIVE
SPEC GRAV UA: 1.015
Urobilinogen, UA: NEGATIVE
pH, UA: 5

## 2016-03-04 MED ORDER — PHENAZOPYRIDINE HCL 100 MG PO TABS: 100.0000 mg | ORAL_TABLET | Freq: Three times a day (TID) | ORAL | Status: DC | PRN

## 2016-03-04 MED ORDER — CIPROFLOXACIN HCL 500 MG PO TABS: 500.0000 mg | ORAL_TABLET | Freq: Two times a day (BID) | ORAL | Status: AC

## 2016-03-04 NOTE — Patient Instructions (Signed)
You are being treated for a urinary tract infection today. Please take your antibiotic as directed. If you develop severe flank pain, blood in the urine, fever, nausea or vomiting, please seek immediate medical attention in the ER.   Drink plenty of fluids and get plenty of rest.  We will get a CT scan tomorrow to determine if there is a kidney stone.

## 2016-03-04 NOTE — Progress Notes (Signed)
Subjective:    Patient ID: Angela Mccullough, female    DOB: May 15, 1966, 51 y.o.   MRN: [id number]  HPI: Angela Mccullough is a 50 y.o. female presenting on 03/04/2016 for Urinary Tract Infection   HPI  Pt presents for UTI and possible kidney stone. Pt is reporting dysuria starting yesterday. Some low back pain and flank pain. Low pelvic pressure. Nauseated all day. No fever. Chills this AM. Pt is also concerned she has passed a kidney stone- 430am. Very bloody urine 430am. Saw specks of debris in her urine this morning. No gross hematuria know. Has history of kidney stones. Last stone was 10 years ago.   No past medical history on file.  Current Outpatient Prescriptions on File Prior to Visit  Medication Sig  . albuterol (PROVENTIL HFA;VENTOLIN HFA) 108 (90 BASE) MCG/ACT inhaler Inhale 2 puffs into the lungs every 6 (six) hours as needed for wheezing or shortness of breath.  . cetirizine (ZYRTEC) 10 MG tablet Take 10 mg by mouth daily.  . Cholecalciferol 2000 units CAPS Take 1 capsule (2,000 Units total) by mouth daily.  . citalopram (CELEXA) 20 MG tablet Take 20 mg by mouth daily.  . diphenhydrAMINE (BENADRYL) 25 MG tablet Take 25 mg by mouth every 4 (four) hours as needed.  . fluticasone (FLOVENT HFA) 110 MCG/ACT inhaler Inhale 1 puff into the lungs 2 (two) times daily.  . Psyllium 28.3 % POWD Take 500 mg by mouth daily.  . Vitamin D, Ergocalciferol, (DRISDOL) 50000 units CAPS capsule Take 1 capsule (50,000 Units total) by mouth every 7 (seven) days.   No current facility-administered medications on file prior to visit.    Review of Systems  Constitutional: Positive for chills. Negative for fever.  HENT: Negative.   Respiratory: Negative for cough, chest tightness and wheezing.   Cardiovascular: Negative for chest pain and leg swelling.  Gastrointestinal: Positive for nausea. Negative for vomiting, abdominal pain, diarrhea and constipation.  Endocrine: Negative.  Negative for  cold intolerance, heat intolerance, polydipsia, polyphagia and polyuria.  Genitourinary: Positive for dysuria, hematuria and flank pain. Negative for difficulty urinating.  Musculoskeletal: Negative.   Neurological: Negative for dizziness, light-headedness and numbness.  Psychiatric/Behavioral: Negative.    Per HPI unless specifically indicated above     Objective:    BP 107/62 mmHg  Pulse 92  Temp(Src) 98 F (36.7 C) (Oral)  Resp 16  Ht 5\' 2"  (1.575 m)  Wt 178 lb (80.74 kg)  BMI 32.55 kg/m2  Wt Readings from Last 3 Encounters:  03/04/16 178 lb (80.74 kg)  01/20/16 182 lb 9.6 oz (82.827 kg)  12/12/15 179 lb (81.194 kg)    Physical Exam  Constitutional: She is oriented to person, place, and time. She appears well-developed and well-nourished. No distress.  HENT:  Head: Normocephalic and atraumatic.  Cardiovascular: Normal rate and regular rhythm.  Exam reveals no gallop and no friction rub.   No murmur heard. Pulmonary/Chest: Effort normal and breath sounds normal. No respiratory distress.  Abdominal: Soft. Normal appearance and bowel sounds are normal. There is tenderness in the suprapubic area. There is no rigidity, no rebound, no guarding, no CVA tenderness, no tenderness at McBurney's point and negative Murphy's sign.  Neurological: She is alert and oriented to person, place, and time. No cranial nerve deficit. Coordination normal.  Skin: She is not diaphoretic.  Psychiatric: Her behavior is normal.   Results for orders placed or performed in visit on 03/04/16  POCT urinalysis dipstick  Result Value  Ref Range   Color, UA yellow    Clarity, UA cloudy    Glucose, UA negative    Bilirubin, UA negative    Ketones, UA negative    Spec Grav, UA 1.015    Blood, UA large    pH, UA 5.0    Protein, UA trace    Urobilinogen, UA negative    Nitrite, UA trace    Leukocytes, UA large (3+) (A) Negative      Assessment & Plan:   Problem List Items Addressed This Visit     None    Visit Diagnoses    Complicated UTI (urinary tract infection)    -  Primary    Treat for complicated UTI given nausea, back pain, and gross hematuria. Rest, fluids, Alarm symptoms reviewed.     Relevant Medications    ciprofloxacin (CIPRO) 500 MG tablet    phenazopyridine (PYRIDIUM) 100 MG tablet    Other Relevant Orders    POCT urinalysis dipstick (Completed)    Urine Culture    Hematuria        R/o kidney stone with stone study. 2/2 renal stone or UTI. Recheck hematuria in 3 weeks. Refer to urology if still present.     Relevant Orders    CT RENAL STONE STUDY       Meds ordered this encounter  Medications  . ciprofloxacin (CIPRO) 500 MG tablet    Sig: Take 1 tablet (500 mg total) by mouth 2 (two) times daily.    Dispense:  14 tablet    Refill:  0    Order Specific Question:  Supervising Provider    Answer:  Arlis Porta [phone]  . phenazopyridine (PYRIDIUM) 100 MG tablet    Sig: Take 1 tablet (100 mg total) by mouth 3 (three) times daily as needed for pain.    Dispense:  10 tablet    Refill:  0    Order Specific Question:  Supervising Provider    Answer:  Arlis Porta [id number]      Follow up plan: Return in about 3 weeks (around 03/25/2016) for UTI.

## 2016-03-06 LAB — URINE CULTURE

## 2016-03-10 ENCOUNTER — Ambulatory Visit
Admission: RE | Admit: 2016-03-10 | Discharge: 2016-03-10 | Disposition: A | Discharge status: Home / Self Care | Payer: BC Managed Care – PPO | Source: Ambulatory Visit | Attending: Family Medicine | Admitting: Family Medicine

## 2016-03-10 DIAGNOSIS — N200 Calculus of kidney: Secondary | ICD-10-CM | POA: Diagnosis not present

## 2016-03-10 DIAGNOSIS — R319 Hematuria, unspecified: Secondary | ICD-10-CM | POA: Diagnosis not present

## 2016-03-11 ENCOUNTER — Other Ambulatory Visit: Payer: Self-pay | Admitting: Family Medicine

## 2016-03-11 DIAGNOSIS — N200 Calculus of kidney: Secondary | ICD-10-CM

## 2016-03-17 ENCOUNTER — Encounter: Payer: Self-pay | Admitting: Urology

## 2016-03-17 ENCOUNTER — Ambulatory Visit (INDEPENDENT_AMBULATORY_CARE_PROVIDER_SITE_OTHER): Payer: BC Managed Care – PPO | Admitting: Urology

## 2016-03-17 VITALS — BP 115/76 | HR 109 | Ht 62.0 in | Wt 181.3 lb

## 2016-03-17 DIAGNOSIS — N200 Calculus of kidney: Secondary | ICD-10-CM

## 2016-03-17 DIAGNOSIS — N1330 Unspecified hydronephrosis: Secondary | ICD-10-CM | POA: Diagnosis not present

## 2016-03-17 DIAGNOSIS — R310 Gross hematuria: Secondary | ICD-10-CM

## 2016-03-17 LAB — URINALYSIS, COMPLETE
BILIRUBIN UA: NEGATIVE
GLUCOSE, UA: NEGATIVE
KETONES UA: NEGATIVE
LEUKOCYTES UA: NEGATIVE
Nitrite, UA: NEGATIVE
PH UA: 7 (ref 5.0–7.5)
PROTEIN UA: NEGATIVE
RBC UA: NEGATIVE
SPEC GRAV UA: 1.015 (ref 1.005–1.030)
UUROB: 0.2 mg/dL (ref 0.2–1.0)

## 2016-03-17 LAB — MICROSCOPIC EXAMINATION
BACTERIA UA: NONE SEEN
EPITHELIAL CELLS (NON RENAL): NONE SEEN /HPF (ref 0–10)
RBC, UA: NONE SEEN /hpf (ref 0–?)
WBC UA: NONE SEEN /HPF (ref 0–?)

## 2016-03-17 NOTE — Progress Notes (Signed)
03/17/2016 4:11 PM   Angela Mccullough 1966-04-04 [id number]  Referring provider: Luciana Axe, NP Angela Mccullough, Angela Mccullough [id number]  Chief Complaint  Patient presents with  . Nephrolithiasis    referred by Angela Mccullough    HPI: Patient is a 50 year old Caucasian female who is referred to [place] by her primary care provider, Angela Overton Mam, NP, for a possible spontaneous passage of ureteral stone.  Patient states that 2 weeks ago she had the sudden onset of right-sided back pain associated with gross hematuria.  She was also found to have a positive urine culture at that time for Escherichia coli and was treated with the appropriate antibiotic.  A CT stone study completed on 03/10/2016 noted a 4 mm right lower pole renal stone. A mild fullness of the right renal pelvis and collecting system without ureteral dilation or ureteral stone.  I reviewed the films personally and with the patient.    Today, she states she is having some intermittent dysuria, but she denies gross hematuria, suprapubic pain or flank pain.  Her UA today is unremarkable.  She also does not endorse any recent fevers, chills, nausea or vomiting.  She does have a prior history of nephrolithiasis and was seen by Angela Mccullough approximately 15 years ago.  Her stone composition is unknown at this time.   PMH: Past Medical History  Diagnosis Date  . Anxiety and depression   . Heartburn   . Kidney stone   . ASD (atrial septal defect), common atrium (single atrium)   . Heart murmur     Surgical History: Past Surgical History  Procedure Laterality Date  . Atrium septal defect  1999    Home Medications:    Medication List       This list is accurate as of: 03/17/16  4:11 PM.  Always use your most recent med list.               albuterol 108 (90 Base) MCG/ACT inhaler  Commonly known as:  PROVENTIL HFA;VENTOLIN HFA  Inhale 2 puffs into the lungs every 6 (six) hours as needed for wheezing or  shortness of breath.     cetirizine 10 MG tablet  Commonly known as:  ZYRTEC  Take 10 mg by mouth daily.     Cholecalciferol 2000 units Caps  Take 1 capsule (2,000 Units total) by mouth daily.     citalopram 20 MG tablet  Commonly known as:  CELEXA  Take 20 mg by mouth daily.     diphenhydrAMINE 25 MG tablet  Commonly known as:  BENADRYL  Take 25 mg by mouth every 4 (four) hours as needed.     fluticasone 110 MCG/ACT inhaler  Commonly known as:  FLOVENT HFA  Inhale 1 puff into the lungs 2 (two) times daily.     phenazopyridine 100 MG tablet  Commonly known as:  PYRIDIUM  Take 1 tablet (100 mg total) by mouth 3 (three) times daily as needed for pain.     Psyllium 28.3 % Powd  Take 500 mg by mouth daily.     Vitamin D (Ergocalciferol) 50000 units Caps capsule  Commonly known as:  DRISDOL  Take 1 capsule (50,000 Units total) by mouth every 7 (seven) days.        Allergies:  Allergies  Allergen Reactions  . Biaxin [Clarithromycin] Nausea And Vomiting    Family History: Family History  Problem Relation Age of Onset  . Diabetes Mother   .  COPD Mother   . Kidney disease Neg Hx   . Kidney Stones Father   . Kidney Stones Mother   . Bladder Cancer Neg Hx     Social History:  reports that she has been smoking Cigarettes.  She has never used smokeless tobacco. She reports that she does not drink alcohol or use illicit drugs.  ROS: UROLOGY Frequent Urination?: No Hard to postpone urination?: No Burning/pain with urination?: Yes Get up at night to urinate?: No Leakage of urine?: No Urine stream starts and stops?: No Trouble starting stream?: No Do you have to strain to urinate?: No Blood in urine?: Yes Urinary tract infection?: Yes Sexually transmitted disease?: No Injury to kidneys or bladder?: No Painful intercourse?: No Weak stream?: No Currently pregnant?: No Vaginal bleeding?: No Last menstrual period?: n  Gastrointestinal Nausea?: No Vomiting?:  No Indigestion/heartburn?: Yes Diarrhea?: No Constipation?: Yes  Constitutional Fever: No Night sweats?: Yes Weight loss?: No Fatigue?: Yes  Skin Skin rash/lesions?: No Itching?: No  Eyes Blurred vision?: No Double vision?: No  Ears/Nose/Throat Sore throat?: No Sinus problems?: Yes  Hematologic/Lymphatic Swollen glands?: No Easy bruising?: No  Cardiovascular Leg swelling?: No Chest pain?: No  Respiratory Cough?: No Shortness of breath?: No  Endocrine Excessive thirst?: No  Musculoskeletal Back pain?: Yes Joint pain?: Yes  Neurological Headaches?: No Dizziness?: No  Psychologic Depression?: Yes Anxiety?: Yes  Physical Exam: BP 115/76 mmHg  Pulse 109  Ht 5\' 2"  (1.575 m)  Wt 181 lb 4.8 oz (82.237 kg)  BMI 33.15 kg/m2  Constitutional: Well nourished. Alert and oriented, No acute distress. HEENT: [place] AT, moist mucus membranes. Trachea midline, no masses. Cardiovascular: No clubbing, cyanosis, or edema. Respiratory: Normal respiratory effort, no increased work of breathing. GI: Abdomen is soft, non tender, non distended, no abdominal masses. Liver and spleen not palpable.  No hernias appreciated.  Stool sample for occult testing is not indicated.   GU: No CVA tenderness.  No bladder fullness or masses.   Skin: No rashes, bruises or suspicious lesions. Lymph: No cervical or inguinal adenopathy. Neurologic: Grossly intact, no focal deficits, moving all 4 extremities. Psychiatric: Normal mood and affect.  Laboratory Data: Lab Results  Component Value Date   WBC 8.7 01/21/2016   HGB 15.1* 07/09/2009   HCT 36.8 01/21/2016   MCV 93 01/21/2016   PLT 520* 01/21/2016    Lab Results  Component Value Date   CREATININE 0.65 11/26/2015    Lab Results  Component Value Date   TSH 3.480 01/21/2016       Component Value Date/Time   CHOL 214* 11/26/2015 0806   HDL 63 11/26/2015 0806   CHOLHDL 3.4 11/26/2015 0806   LDLCALC 128* 11/26/2015 0806     Lab Results  Component Value Date   AST 15 11/26/2015   Lab Results  Component Value Date   ALT 17 11/26/2015     Urinalysis Microscopic Examination  Result Value Ref Range   WBC, UA None seen 0 -  5 /hpf   RBC, UA None seen 0 -  2 /hpf   Epithelial Cells (non renal) None seen 0 - 10 /hpf   Bacteria, UA None seen None seen/Few  Urinalysis, Complete  Result Value Ref Range   Specific Gravity, UA 1.015 1.005 - 1.030   pH, UA 7.0 5.0 - 7.5   Color, UA Yellow Yellow   Appearance Ur Clear Clear   Leukocytes, UA Negative Negative   Protein, UA Negative Negative/Trace   Glucose, UA Negative  Negative   Ketones, UA Negative Negative   RBC, UA Negative Negative   Bilirubin, UA Negative Negative   Urobilinogen, Ur 0.2 0.2 - 1.0 mg/dL   Nitrite, UA Negative Negative   Microscopic Examination See below:    Pertinent Imaging: CLINICAL DATA: Gross hematuria beginning 1 week ago. Right flank pain.  EXAM: CT ABDOMEN AND PELVIS WITHOUT CONTRAST  TECHNIQUE: Multidetector CT imaging of the abdomen and pelvis was performed following the standard protocol without IV contrast.  COMPARISON: None.  FINDINGS: Lower chest: Lung bases are clear. No effusions. Heart is normal size.  Hepatobiliary: Unremarkable unenhanced appearance  Pancreas: Unremarkable unenhanced appearance  Spleen: Unremarkable unenhanced appearance  Adrenals/Urinary Tract: There is mild fullness of the right renal collecting system. The right ureter is decompressed. 4 mm stone in the lower pole of the right kidney. No stones on the left. No ureteral stones. Urinary bladder is unremarkable.  Stomach/Bowel: Appendix is normal. Stomach, large and small bowel grossly unremarkable.  Vascular/Lymphatic: No evidence of aneurysm or adenopathy.  Reproductive: Uterus and adnexa unremarkable. No mass.  Other: No free fluid or free air  Musculoskeletal: No acute bony  abnormality.  IMPRESSION: 4 mm right lower pole renal stone. There is mild fullness of the right renal pelvis and collecting system without ureteral dilatation or ureteral stone.   Electronically Signed  By: Rolm Baptise M.D.  On: 03/10/2016 16:20  Assessment & Plan:    1.  Possible passage of a right ureteral stone:   Patient believes she may have passed a stone prior to CT scan. The CT scan did note mild fullness of the right renal pelvis.  We will obtain a renal ultrasound in 3 weeks to ensure this resolves.   She is desiring definitive treatment for the remaining right lower renal stone. If the mild fullness persists, a retrograde pyelogram can  performed at the time of an ureteroscopic extraction of the stone.   2. Kidney stones:   Patient is desiring definitive treatment for her right lower pole renal stone.  She does not want to undergo ESWL.  We did discuss ureteroscopic extraction of the stone after she completes the renal ultrasound in 3 weeks.  If the fullness persists in the right renal pelvis, we will pursue a retrograde at the time of the procedure.  3. Gross hematuria:   Patient had an episode of gross hematuria associated with a possible passage of a ureteral stone and a urinary tract infection.  She does not endorse any recent gross hematuria and her UA today is negative for hematuria.  We will continue to monitor once she is stone free and without infection.    - Urinalysis, Complete - CULTURE, URINE COMPREHENSIVE   Return for RUS in 3 weeks; I will call with results.  These notes generated with voice recognition software. I apologize for typographical errors.  Zara Council, [facility] [address], [place] [employer], [place] [address] [phone]

## 2016-03-19 LAB — CULTURE, URINE COMPREHENSIVE

## 2016-03-22 DIAGNOSIS — R310 Gross hematuria: Secondary | ICD-10-CM | POA: Insufficient documentation

## 2016-03-22 DIAGNOSIS — N1330 Unspecified hydronephrosis: Secondary | ICD-10-CM | POA: Insufficient documentation

## 2016-03-23 ENCOUNTER — Ambulatory Visit (INDEPENDENT_AMBULATORY_CARE_PROVIDER_SITE_OTHER): Payer: BC Managed Care – PPO | Admitting: Family Medicine

## 2016-03-23 VITALS — BP 105/71 | HR 87 | Temp 97.9°F | Resp 16 | Ht 62.0 in | Wt 182.0 lb

## 2016-03-23 DIAGNOSIS — N390 Urinary tract infection, site not specified: Secondary | ICD-10-CM | POA: Diagnosis not present

## 2016-03-23 DIAGNOSIS — J301 Allergic rhinitis due to pollen: Secondary | ICD-10-CM

## 2016-03-23 DIAGNOSIS — J309 Allergic rhinitis, unspecified: Secondary | ICD-10-CM | POA: Insufficient documentation

## 2016-03-23 DIAGNOSIS — L309 Dermatitis, unspecified: Secondary | ICD-10-CM

## 2016-03-23 LAB — POCT URINALYSIS DIPSTICK
BILIRUBIN UA: NEGATIVE
GLUCOSE UA: NEGATIVE
KETONES UA: NEGATIVE
LEUKOCYTES UA: NEGATIVE
Nitrite, UA: NEGATIVE
Protein, UA: NEGATIVE
RBC UA: NEGATIVE
SPEC GRAV UA: 1.015
UROBILINOGEN UA: NEGATIVE
pH, UA: 5

## 2016-03-23 MED ORDER — MOMETASONE FUROATE 50 MCG/ACT NA SUSP: 2.0000 | Freq: Every day | NASAL | Status: DC

## 2016-03-23 MED ORDER — PSEUDOEPHEDRINE HCL 60 MG PO TABS: 60.0000 mg | ORAL_TABLET | Freq: Three times a day (TID) | ORAL | Status: DC | PRN

## 2016-03-23 MED ORDER — TRIAMCINOLONE ACETONIDE 0.1 % EX CREA: 1.0000 "application " | TOPICAL_CREAM | Freq: Two times a day (BID) | CUTANEOUS | Status: DC

## 2016-03-23 NOTE — Patient Instructions (Signed)
Allergies- Try nasonex to help with your allergy symptoms. Spray in the nose once daily. Try sudafed for your sinus HA. Alternate advil and tylenol to help with pain. Continue zyrtec.  Continue your inhalers as needed. Please seek immediate medical attention if you develop shortness of breath not relieve by inhaler, chest pain/tightness, fever > 103 F or other concerning symptoms.    Hand peeling- try OTC hydrocortisone cream to help with hand rash. If it gets better, please let me know and we will try potent steroid.  If it gets worse, it's likely a fungal rash.

## 2016-03-23 NOTE — Progress Notes (Signed)
Subjective:    Patient ID: Angela Mccullough, female    DOB: 11-12-1966, 50 y.o.   MRN: [id number]  HPI: Angela Mccullough is a 50 y.o. female presenting on 03/23/2016 for Urinary Tract Infection   HPI  Pt presents for recheck of UTI. Renal stone found- she is following with urology. It will need to be removed. No blood in urine. Some flank pain on flank pain. No nausea. All symptoms resolved.  Allergies/Asthma- no wheezing, no chest tightness. Is having sinus HA. Using daily flovent and albuterol PRN because pollen usually bothers her. It taking zyrtec OTC. Some runny noise. Sinus pressure behind the eyes. No fevers.  Rash on hand- dermatitis on hands present x 2 years. Started on foot and spread to hand. Irritated by lots of hand washing. Tired OTC antifungal thinking it was athletes foot- no improvement.     Past Medical History  Diagnosis Date  . Anxiety and depression   . Heartburn   . Kidney stone   . ASD (atrial septal defect), common atrium (single atrium)   . Heart murmur     Current Outpatient Prescriptions on File Prior to Visit  Medication Sig  . albuterol (PROVENTIL HFA;VENTOLIN HFA) 108 (90 BASE) MCG/ACT inhaler Inhale 2 puffs into the lungs every 6 (six) hours as needed for wheezing or shortness of breath.  . cetirizine (ZYRTEC) 10 MG tablet Take 10 mg by mouth daily.  . Cholecalciferol 2000 units CAPS Take 1 capsule (2,000 Units total) by mouth daily.  . citalopram (CELEXA) 20 MG tablet Take 20 mg by mouth daily.  . diphenhydrAMINE (BENADRYL) 25 MG tablet Take 25 mg by mouth every 4 (four) hours as needed.  . fluticasone (FLOVENT HFA) 110 MCG/ACT inhaler Inhale 1 puff into the lungs 2 (two) times daily.  . phenazopyridine (PYRIDIUM) 100 MG tablet Take 1 tablet (100 mg total) by mouth 3 (three) times daily as needed for pain.  Marland Kitchen Psyllium 28.3 % POWD Take 500 mg by mouth daily.  . Vitamin D, Ergocalciferol, (DRISDOL) 50000 units CAPS capsule Take 1 capsule (50,000  Units total) by mouth every 7 (seven) days.   No current facility-administered medications on file prior to visit.    Review of Systems  Constitutional: Negative for fever and chills.  HENT: Positive for postnasal drip, rhinorrhea and sinus pressure.   Respiratory: Negative for cough, chest tightness and wheezing.   Cardiovascular: Negative for chest pain and leg swelling.  Gastrointestinal: Negative for nausea, vomiting, abdominal pain, diarrhea and constipation.  Endocrine: Negative.  Negative for cold intolerance, heat intolerance, polydipsia, polyphagia and polyuria.  Genitourinary: Positive for flank pain (occasional. ). Negative for dysuria and difficulty urinating.  Musculoskeletal: Negative.   Allergic/Immunologic: Positive for environmental allergies.  Neurological: Negative for dizziness, light-headedness and numbness.  Psychiatric/Behavioral: Negative.    Per HPI unless specifically indicated above     Objective:    BP 105/71 mmHg  Pulse 87  Temp(Src) 97.9 F (36.6 C) (Oral)  Resp 16  Ht 5\' 2"  (1.575 m)  Wt 182 lb (82.555 kg)  BMI 33.28 kg/m2  Wt Readings from Last 3 Encounters:  03/23/16 182 lb (82.555 kg)  03/17/16 181 lb 4.8 oz (82.237 kg)  03/04/16 178 lb (80.74 kg)    Physical Exam  Constitutional: She is oriented to person, place, and time. She appears well-developed and well-nourished.  HENT:  Head: Normocephalic and atraumatic.  Right Ear: Hearing and tympanic membrane normal.  Left Ear: Hearing and tympanic membrane normal.  Nose: Mucosal edema and rhinorrhea present. Right sinus exhibits no maxillary sinus tenderness and no frontal sinus tenderness. Left sinus exhibits no maxillary sinus tenderness and no frontal sinus tenderness.  Mouth/Throat: Mucous membranes are normal. No posterior oropharyngeal erythema.  Neck: Neck supple.  Cardiovascular: Normal rate, regular rhythm and normal heart sounds.  Exam reveals no gallop and no friction rub.   No  murmur heard. Pulmonary/Chest: Effort normal and breath sounds normal. She has no wheezes. She exhibits no tenderness.  Abdominal: Soft. Normal appearance and bowel sounds are normal. She exhibits no distension and no mass. There is no tenderness. There is no rebound and no guarding.  Musculoskeletal: Normal range of motion. She exhibits no edema or tenderness.  Lymphadenopathy:    She has no cervical adenopathy.  Neurological: She is alert and oriented to person, place, and time.  Skin: Skin is warm and dry.      Results for orders placed or performed in visit on 03/23/16  POCT Urinalysis Dipstick  Result Value Ref Range   Color, UA yellow    Clarity, UA cloudy    Glucose, UA negative    Bilirubin, UA negative    Ketones, UA negative    Spec Grav, UA 1.015    Blood, UA negative    pH, UA 5.0    Protein, UA negative    Urobilinogen, UA negative    Nitrite, UA negative    Leukocytes, UA Negative Negative      Assessment & Plan:   Problem List Items Addressed This Visit      Respiratory   Allergic rhinitis    Trial of nasonex to help with symptoms. Sudafed to help with congestion PRN. Continue OTC zyrtec.  Continue inhalers PRN. Reviewed s/s of breathing trouble and encouraged pt to be seen as needed.        Relevant Medications   mometasone (NASONEX) 50 MCG/ACT nasal spray   pseudoephedrine (SUDAFED) 60 MG tablet    Other Visit Diagnoses    UTI (lower urinary tract infection)    -  Primary    Resolved. Renal stone followed by urology.     Relevant Orders    POCT Urinalysis Dipstick (Completed)    Eczema of left hand        Dermatits vs fungal. Trial of home hydrocortisone. If worsens treat with antifungal.        Meds ordered this encounter  Medications  . mometasone (NASONEX) 50 MCG/ACT nasal spray    Sig: Place 2 sprays into the nose daily.    Dispense:  17 g    Refill:  12    Order Specific Question:  Supervising Provider    Answer:  Arlis Porta  [phone]  . pseudoephedrine (SUDAFED) 60 MG tablet    Sig: Take 1 tablet (60 mg total) by mouth every 8 (eight) hours as needed for congestion.    Dispense:  30 tablet    Refill:  0    Order Specific Question:  Supervising Provider    Answer:  Arlis Porta [phone]  . DISCONTD: triamcinolone cream (KENALOG) 0.1 %    Sig: Apply 1 application topically 2 (two) times daily.    Dispense:  30 g    Refill:  0    Order Specific Question:  Supervising Provider    Answer:  Arlis Porta [id number]      Follow up plan: Return if symptoms worsen or fail to improve.

## 2016-03-23 NOTE — Assessment & Plan Note (Addendum)
Trial of nasonex to help with symptoms. Sudafed to help with congestion PRN. Continue OTC zyrtec.  Continue inhalers PRN. Reviewed s/s of breathing trouble and encouraged pt to be seen as needed.

## 2016-04-08 ENCOUNTER — Ambulatory Visit: Payer: BC Managed Care – PPO

## 2016-11-02 ENCOUNTER — Ambulatory Visit
Admission: RE | Admit: 2016-11-02 | Discharge: 2016-11-02 | Disposition: A | Discharge status: Home / Self Care | Payer: BC Managed Care – PPO | Source: Ambulatory Visit | Attending: Family Medicine | Admitting: Family Medicine

## 2016-11-02 ENCOUNTER — Encounter: Payer: Self-pay | Admitting: Family Medicine

## 2016-11-02 ENCOUNTER — Ambulatory Visit (INDEPENDENT_AMBULATORY_CARE_PROVIDER_SITE_OTHER): Payer: BC Managed Care – PPO | Admitting: Family Medicine

## 2016-11-02 DIAGNOSIS — M5441 Lumbago with sciatica, right side: Secondary | ICD-10-CM | POA: Diagnosis not present

## 2016-11-02 DIAGNOSIS — G8929 Other chronic pain: Secondary | ICD-10-CM

## 2016-11-02 DIAGNOSIS — I700 Atherosclerosis of aorta: Secondary | ICD-10-CM | POA: Diagnosis not present

## 2016-11-02 DIAGNOSIS — M5116 Intervertebral disc disorders with radiculopathy, lumbar region: Secondary | ICD-10-CM | POA: Insufficient documentation

## 2016-11-02 MED ORDER — BACLOFEN 10 MG PO TABS: 5.0000 mg | ORAL_TABLET | Freq: Three times a day (TID) | ORAL | 1 refills | Status: DC | PRN

## 2016-11-02 MED ORDER — PREDNISONE 20 MG PO TABS: ORAL_TABLET | ORAL | 0 refills | Status: DC

## 2016-11-02 NOTE — Progress Notes (Signed)
Subjective:    Patient ID: Angela Mccullough, female    DOB: 1966/08/19, 50 y.o.   MRN: [id number]  Angela Mccullough is a 50 y.o. female presenting on 11/02/2016 for Back Pain (onset yesterday lower back Right side rediate to hip to lower leg )  Patient presents for a same day appointment.   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 - Today mild improvement but persistent back pain, also with associated numbness in tips of Right toes without paresthesias radiating down R leg. Describes pain as aching, sharp pains, moderate to severe at worst with intermittent worsening from bending forward and difficulty returning to standing position, prolonged sitting or standing - Yesterday she started OTC Naproxen 250mg  x 2 tabs (500mg ) twice a day, and using heating pad with relief - Not taking Tylenol - No known history of lumbar OA/DJD, no known back imaging, no prior back surgery. She does endorse a chronic history of midline low back pain gradual worsening flares over past 2-3 years without acute injury or trauma, previously no sciatica symptoms and no distal numbness. Last flare-up 06/2016 - Admits some difficulty sleeping due to pain - Admits R toe numbness - Denies any fevers/chills, numbness, tingling, weakness, loss of control bladder/bowel incontinence or retention, unintentional wt loss, night sweats  Social History  Substance Use Topics  . Smoking status: Current Every Day Smoker    Types: Cigarettes  . Smokeless tobacco: Current User  . Alcohol use No    Review of Systems Per HPI unless specifically indicated above     Objective:    BP 127/67   Pulse 78   Temp 98 F (36.7 C) (Oral)   Resp 16   Ht 5\' 2"  (1.575 m)   Wt 191 lb (86.6 kg)    BMI 34.93 kg/m   Wt Readings from Last 3 Encounters:  11/02/16 191 lb (86.6 kg)  03/23/16 182 lb (82.6 kg)  03/17/16 181 lb 4.8 oz (82.2 kg)    Physical Exam  Constitutional: She appears well-developed and well-nourished. No distress.  Well-appearing, mild discomfort with low back, cooperative  HENT:  Head: Normocephalic and atraumatic.  Mouth/Throat: Oropharynx is clear and moist.  Cardiovascular: Normal rate, regular rhythm, normal heart sounds and intact distal pulses.   No murmur heard. Pulmonary/Chest: Effort normal and breath sounds normal. No respiratory distress. She has no wheezes. She has no rales.  Musculoskeletal: She exhibits no edema.  Low Back Inspection: Normal appearance, no spinal deformity, symmetrical. Palpation: No tenderness over spinous processes. Bilateral lumbar paraspinal muscles non-tender but with mild to moderate hypertonicity/spasm R>L, primarily lower lumbar and SI region. ROM: Limited full forward flex ROM due to back muscle tightness and pain, intact back extension and rotation. Special Testing: Seated SLR negative for radicular pain bilaterally, with R > L low back into buttock muscle tightness without pain. Standing facet load test positive R back pain and negative on L Strength: Bilateral hip flex/ext 5/5, knee flex/ext 5/5, ankle dorsiflex/plantarflex 5/5  Neurological: She is alert.  Distal sensation to light touch lower extremities very mild reduced only on tips of toes Right foot, not reduced dorsal or plantar aspect of R foot. Rest of lower extremities intact.  Lower Ext DTR bilateral patellar +2,  left +1 achilles, right trace to +1  Gait with questionable very mild Right foot drop, difficult assessment due to some acute low back pain today.  Skin: Skin is warm and dry. No rash noted. She is not diaphoretic.  Nursing note and vitals reviewed.     Assessment & Plan:   Problem List Items Addressed This Visit    Chronic low back pain with  right-sided sciatica    Acute on chronic midline / R LBP with associated R toe numbness and possible sciatica without clear radicular pain symptoms. Suspect likely due to muscle spasm/strain with chronic overuse without known acute injury or trauma. In setting of known chronic LBP 2-3 years without confirmed dx DJD - Concern with R toe numbness and possible foot drop, otherwise no other red flag symptoms. Negative SLR for radiculopathy - Appropriate conservative therapy, only limited duration  Plan: 1. Start prednisone burst with 7 day taper 60 x 2 days > 40 x 2 days > 20 x 3 days 2. Hold naproxen while on prednisone, then resume after 1 week, 500mg  BID x 2 weeks then PRN 3. Start muscle relaxant with Baclofen 10mg  tabs - take 5-10mg  up to TID PRN, titrate up as tolerated 4. May use Tylenol PRN for breakthrough 5. Encouraged use of heating pad 1-2x daily for now then PRN 6. Check Lumbar X-rays today in office given chronicity >2-3 years intermittent flares 7. Note for work out 1 week, activity modification 8. Follow-up 4 weeks, consider PT if gradual improvement. Return criteria given, if concern with R foot drop / numbness, discuss possible Lumbar MRI      Relevant Medications   baclofen (LIORESAL) 10 MG tablet   predniSONE (DELTASONE) 20 MG tablet   Other Relevant Orders   DG Lumbar Spine Complete      Meds ordered this encounter  Medications  . baclofen (LIORESAL) 10 MG tablet    Sig: Take 0.5-1 tablets (5-10 mg total) by mouth 3 (three) times daily as needed for muscle spasms.    Dispense:  30 each    Refill:  1  . predniSONE (DELTASONE) 20 MG tablet    Sig: Take 60mg  x 2 days, 40mg  x 2 days, 20mg  x 3 days    Dispense:  13 tablet    Refill:  0      Follow up plan: Return in about 4 weeks (around 11/30/2016) for low back pain.  Nobie Putnam, DO [facility] 11/02/2016, 9:21 PM

## 2016-11-02 NOTE — Patient Instructions (Signed)
Thank you for coming in to clinic today.  1. For your Back Pain - I think that this is due to Muscle Spasms or strain. Your Sciatic Nerve can be affected causing some of your radiation and numbness down your legs. - Start Prednisone taper as prescribed, do not take naproxen while on prednisone, finish after 7 days 2. After finish prednisone, Start with anti-inflammatory Naproxen 500mg  twice daily (12 hrs apart, with food, breakfast and dinner) every day for next 2 to 4 weeks if helping, then can use only as needed 3. Start Baclofen (Lioresal) 10mg  tablets - cut in half for 5mg  at night for muscle relaxant - may make you sedated or sleepy (be careful driving or working on this) if tolerated you can take every 8 hours, half or whole tab 4. May use Tylenol Extra Str 500mg  tabs - may take 1-2 tablets every 6 hours as needed 5. Recommend to start using heating pad on your lower back 1-2x daily for few weeks  This pain may take weeks to months to fully resolve, but hopefully it will respond to the medicine initially. All back injuries (small or serious) are slow to heal since we use our back muscles every day. Be careful with turning, twisting, lifting, sitting / standing for prolonged periods, and avoid re-injury.  If your symptoms significantly worsen with more pain, or new symptoms with weakness in one or both legs, new or different shooting leg pains, numbness in legs or groin, loss of control or retention of urine or bowel movements, please call back for advice and you may need to go directly to the Emergency Department.  Results of X-rays on mychart will call if needed  Please schedule a follow-up appointment with Dr. Parks Ranger in 1 week if worsening Low Back Pain otherwise 1 month for follow-up Back Pain if improved  If you have any other questions or concerns, please feel free to call the clinic or send a message through [place]. You may also schedule an earlier appointment if  necessary.  Nobie Putnam, DO [place]

## 2016-11-02 NOTE — Assessment & Plan Note (Addendum)
Acute on chronic midline / R LBP with associated R toe numbness and possible sciatica without clear radicular pain symptoms. Suspect likely due to muscle spasm/strain with chronic overuse without known acute injury or trauma. In setting of known chronic LBP 2-3 years without confirmed dx DJD - Concern with R toe numbness and possible foot drop, otherwise no other red flag symptoms. Negative SLR for radiculopathy - Appropriate conservative therapy, only limited duration  Plan: 1. Start prednisone burst with 7 day taper 60 x 2 days > 40 x 2 days > 20 x 3 days 2. Hold naproxen while on prednisone, then resume after 1 week, 500mg  BID x 2 weeks then PRN 3. Start muscle relaxant with Baclofen 10mg  tabs - take 5-10mg  up to TID PRN, titrate up as tolerated 4. May use Tylenol PRN for breakthrough 5. Encouraged use of heating pad 1-2x daily for now then PRN 6. Check Lumbar X-rays today in office given chronicity >2-3 years intermittent flares 7. Note for work out 1 week, activity modification 8. Follow-up 4 weeks, consider PT if gradual improvement. Return criteria given, if concern with R foot drop / numbness, discuss possible Lumbar MRI

## 2017-09-21 IMAGING — CR DG CHEST 2V
1 series · 2 of 2 positions shown · non-contrast
Comparison: 03/18/2006

CLINICAL DATA: RIGHT lower lobe pneumonia diagnosed 10/03/2015,
still coughing with nonproductive cough, rattling in chest, slightly
improved shortness of breath, fatigue, mid chest heaviness, smoker,
prior ASD repair

EXAM:
CHEST  2 VIEW

[Series 1: pa · 0.17mm/px · 2 of 2 slices shown]
[im 1/2]
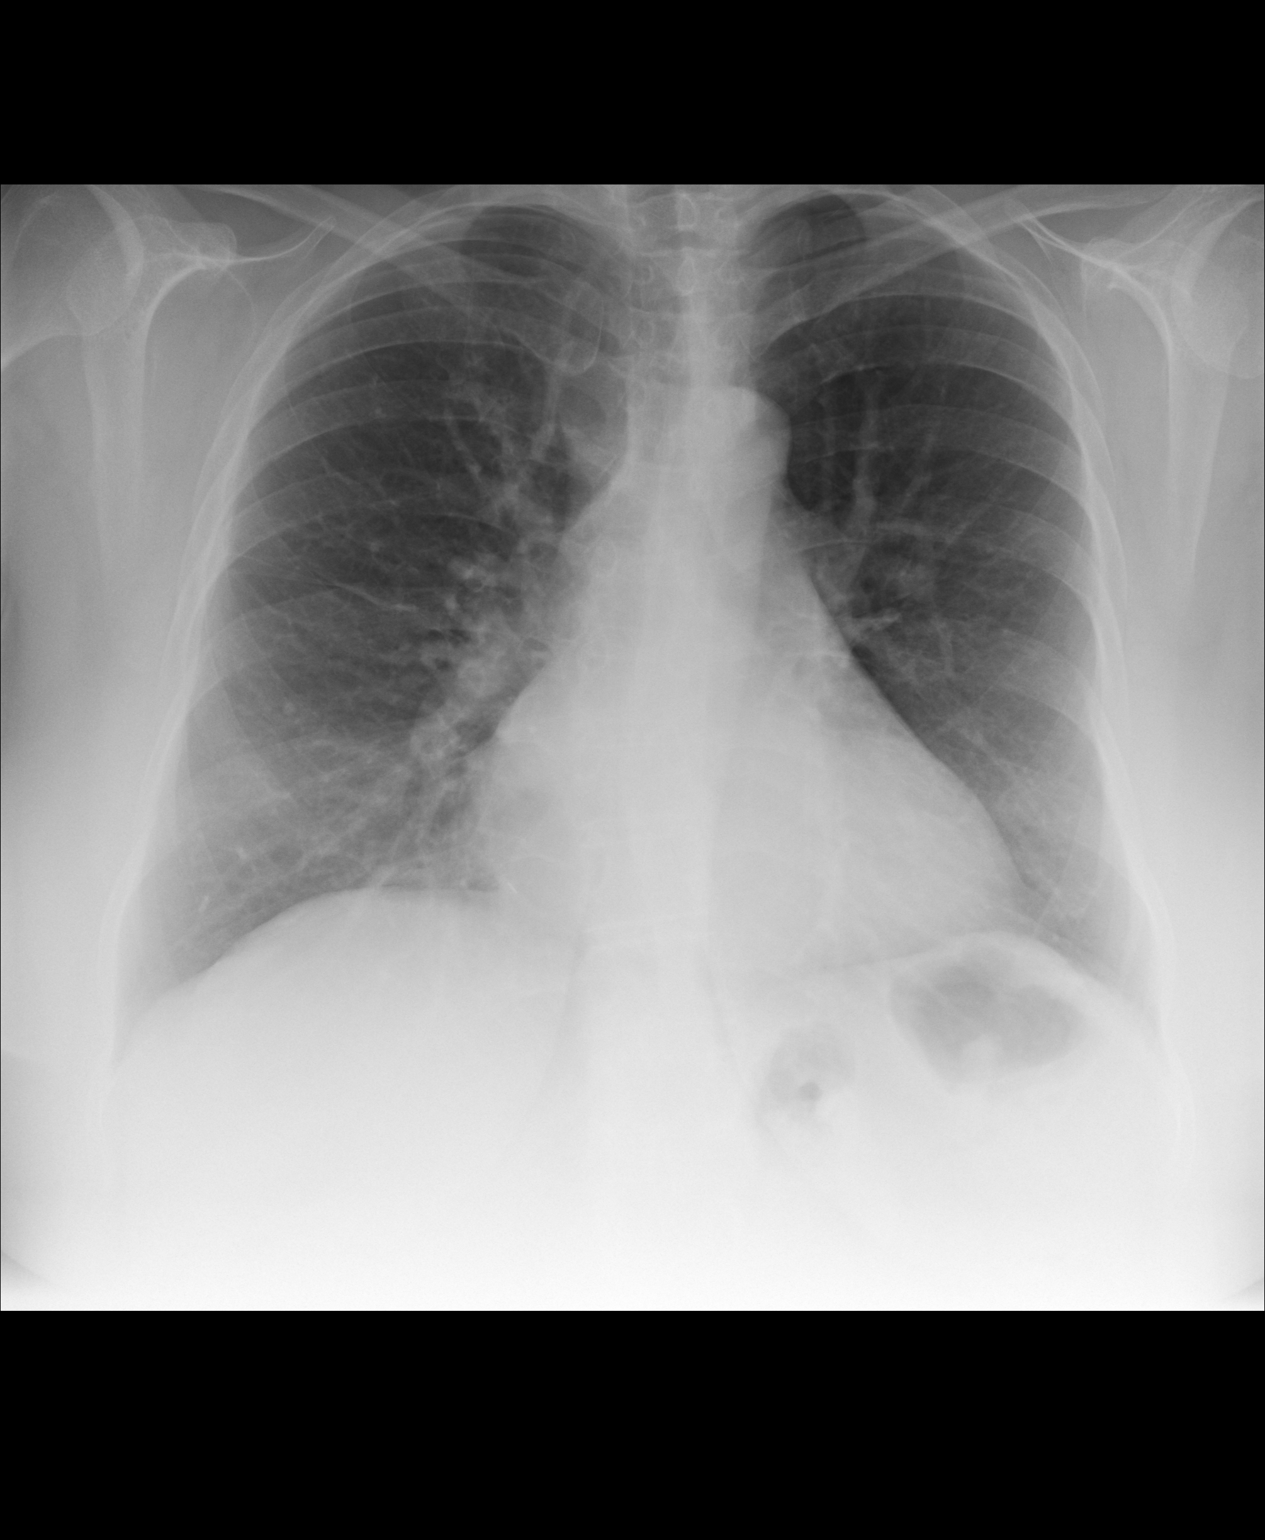
[im 2/2]
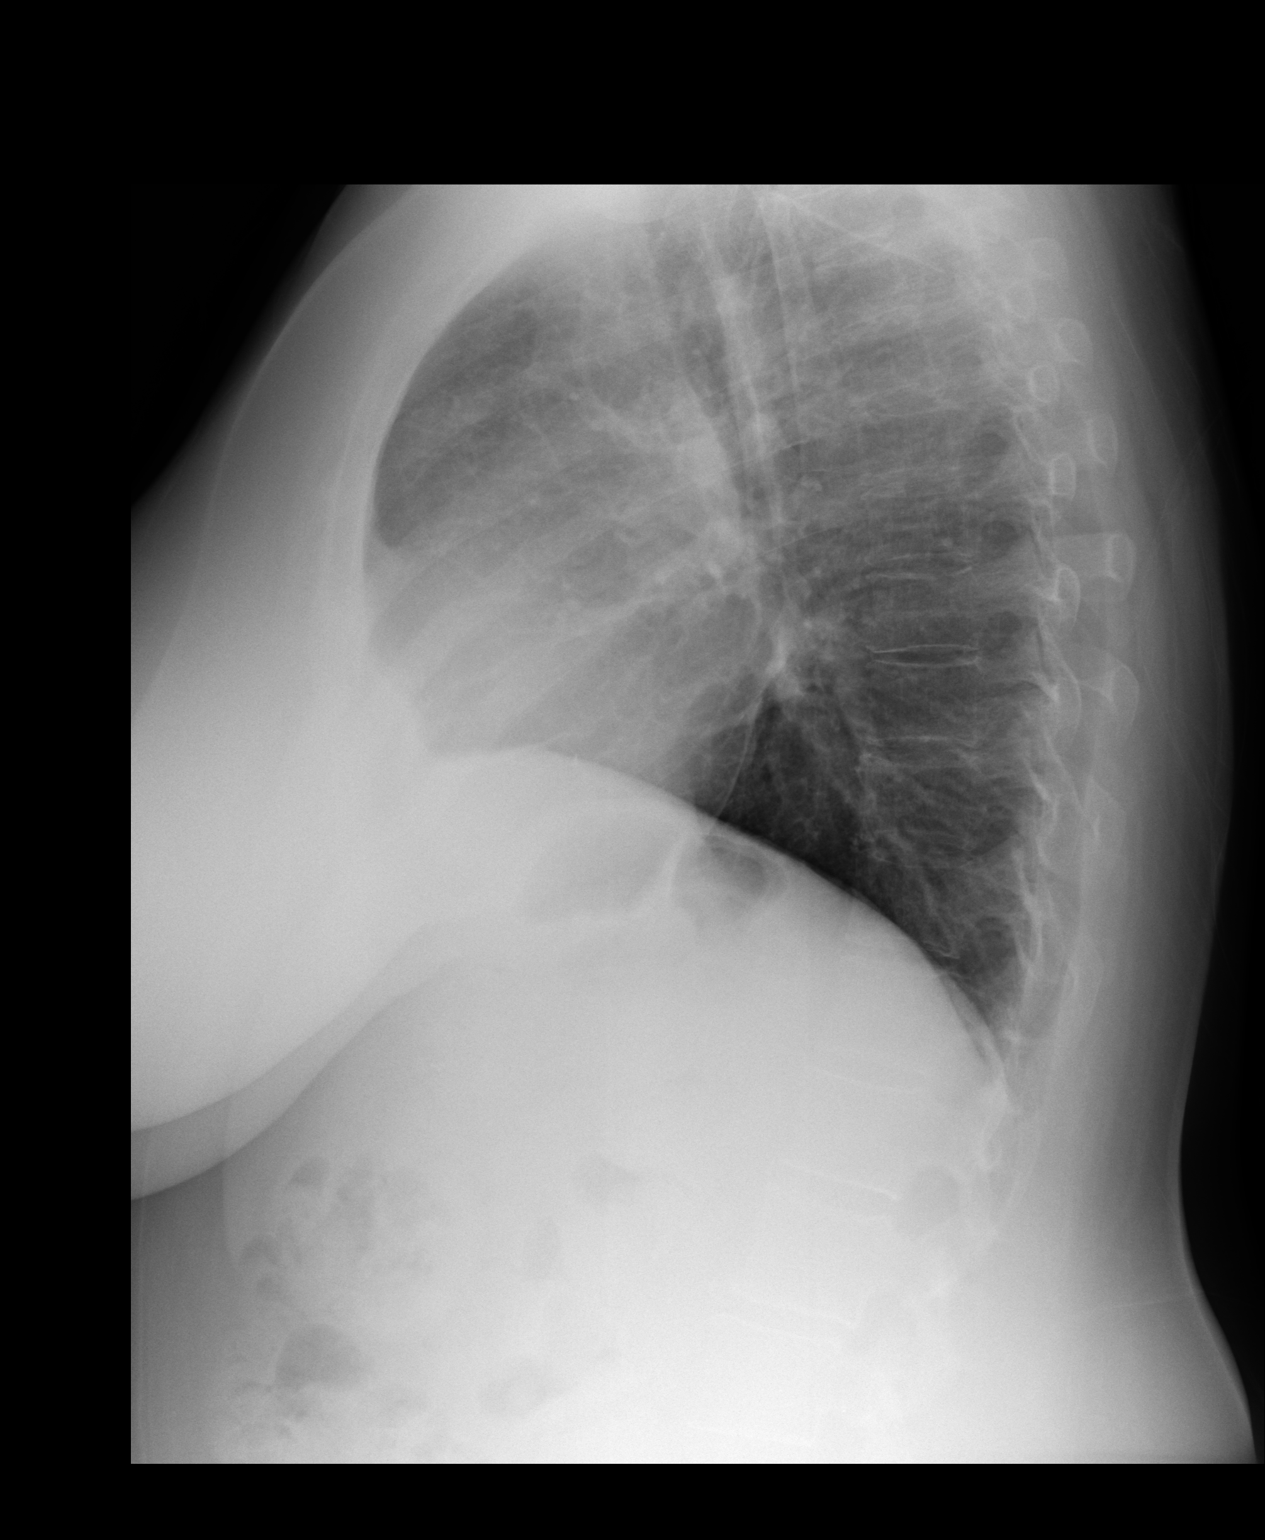

[2 of 2 positions shown; findings below may reference images not displayed]

FINDINGS: Mild enlargement of cardiac silhouette.

Mediastinal contours and pulmonary vascularity normal.

Lungs clear.

No pleural effusion or pneumothorax.

Tiny calcified granuloma lower RIGHT chest.

Bones unremarkable.
IMPRESSION: Mild enlargement of cardiac silhouette.

No acute abnormalities.

## 2017-10-26 ENCOUNTER — Encounter: Payer: Self-pay | Admitting: Emergency Medicine

## 2017-10-26 ENCOUNTER — Encounter
Admission: EM | Disposition: A | Discharge status: Home / Self Care | Payer: Self-pay | Source: Home / Self Care | Attending: Internal Medicine

## 2017-10-26 ENCOUNTER — Inpatient Hospital Stay
Admission: EM | Admit: 2017-10-26 | Discharge: 2017-10-28 | DRG: 251 | Disposition: A | Discharge status: Home / Self Care | Payer: BC Managed Care – PPO | Attending: Internal Medicine | Admitting: Internal Medicine

## 2017-10-26 DIAGNOSIS — I255 Ischemic cardiomyopathy: Secondary | ICD-10-CM | POA: Diagnosis present

## 2017-10-26 DIAGNOSIS — I2119 ST elevation (STEMI) myocardial infarction involving other coronary artery of inferior wall: Secondary | ICD-10-CM | POA: Diagnosis present

## 2017-10-26 DIAGNOSIS — Z87442 Personal history of urinary calculi: Secondary | ICD-10-CM | POA: Diagnosis not present

## 2017-10-26 DIAGNOSIS — F4310 Post-traumatic stress disorder, unspecified: Secondary | ICD-10-CM | POA: Diagnosis present

## 2017-10-26 DIAGNOSIS — R079 Chest pain, unspecified: Secondary | ICD-10-CM

## 2017-10-26 DIAGNOSIS — F329 Major depressive disorder, single episode, unspecified: Secondary | ICD-10-CM

## 2017-10-26 DIAGNOSIS — F418 Other specified anxiety disorders: Secondary | ICD-10-CM | POA: Diagnosis present

## 2017-10-26 DIAGNOSIS — F419 Anxiety disorder, unspecified: Secondary | ICD-10-CM

## 2017-10-26 DIAGNOSIS — F17210 Nicotine dependence, cigarettes, uncomplicated: Secondary | ICD-10-CM | POA: Diagnosis present

## 2017-10-26 DIAGNOSIS — I213 ST elevation (STEMI) myocardial infarction of unspecified site: Secondary | ICD-10-CM

## 2017-10-26 DIAGNOSIS — K219 Gastro-esophageal reflux disease without esophagitis: Secondary | ICD-10-CM | POA: Diagnosis present

## 2017-10-26 DIAGNOSIS — Z881 Allergy status to other antibiotic agents status: Secondary | ICD-10-CM

## 2017-10-26 DIAGNOSIS — I252 Old myocardial infarction: Secondary | ICD-10-CM | POA: Diagnosis present

## 2017-10-26 DIAGNOSIS — Z8774 Personal history of (corrected) congenital malformations of heart and circulatory system: Secondary | ICD-10-CM

## 2017-10-26 DIAGNOSIS — Z833 Family history of diabetes mellitus: Secondary | ICD-10-CM

## 2017-10-26 DIAGNOSIS — E785 Hyperlipidemia, unspecified: Secondary | ICD-10-CM

## 2017-10-26 DIAGNOSIS — I340 Nonrheumatic mitral (valve) insufficiency: Secondary | ICD-10-CM | POA: Diagnosis not present

## 2017-10-26 DIAGNOSIS — I2121 ST elevation (STEMI) myocardial infarction involving left circumflex coronary artery: Secondary | ICD-10-CM | POA: Diagnosis not present

## 2017-10-26 DIAGNOSIS — I219 Acute myocardial infarction, unspecified: Secondary | ICD-10-CM

## 2017-10-26 DIAGNOSIS — I10 Essential (primary) hypertension: Secondary | ICD-10-CM | POA: Diagnosis present

## 2017-10-26 DIAGNOSIS — Z825 Family history of asthma and other chronic lower respiratory diseases: Secondary | ICD-10-CM

## 2017-10-26 DIAGNOSIS — Z8249 Family history of ischemic heart disease and other diseases of the circulatory system: Secondary | ICD-10-CM | POA: Diagnosis not present

## 2017-10-26 HISTORY — PX: LEFT HEART CATH AND CORONARY ANGIOGRAPHY: CATH118249

## 2017-10-26 HISTORY — PX: CORONARY/GRAFT ACUTE MI REVASCULARIZATION: CATH118305

## 2017-10-26 HISTORY — DX: Acute myocardial infarction, unspecified: I21.9

## 2017-10-26 LAB — CBC WITH DIFFERENTIAL/PLATELET
BASOS PCT: 0 %
Basophils Absolute: 0.1 10*3/uL (ref 0–0.1)
Eosinophils Absolute: 0 10*3/uL (ref 0–0.7)
Eosinophils Relative: 0 %
HEMATOCRIT: 38.9 % (ref 35.0–47.0)
HEMOGLOBIN: 13.3 g/dL (ref 12.0–16.0)
LYMPHS ABS: 2.3 10*3/uL (ref 1.0–3.6)
LYMPHS PCT: 14 %
MCH: 31.4 pg (ref 26.0–34.0)
MCHC: 34.2 g/dL (ref 32.0–36.0)
MCV: 91.5 fL (ref 80.0–100.0)
MONO ABS: 0.9 10*3/uL (ref 0.2–0.9)
MONOS PCT: 6 %
NEUTROS ABS: 13 10*3/uL — AB (ref 1.4–6.5)
NEUTROS PCT: 80 %
Platelets: 532 10*3/uL — ABNORMAL HIGH (ref 150–440)
RBC: 4.25 MIL/uL (ref 3.80–5.20)
RDW: 12.8 % (ref 11.5–14.5)
WBC: 16.2 10*3/uL — ABNORMAL HIGH (ref 3.6–11.0)

## 2017-10-26 LAB — LIPID PANEL
CHOL/HDL RATIO: 4.3 ratio
Cholesterol: 213 mg/dL — ABNORMAL HIGH (ref 0–200)
HDL: 49 mg/dL (ref >40)
LDL CALC: 130 mg/dL — AB (ref 0–99)
Triglycerides: 172 mg/dL — ABNORMAL HIGH (ref <150)
VLDL: 34 mg/dL (ref 0–40)

## 2017-10-26 LAB — COMPREHENSIVE METABOLIC PANEL
ALBUMIN: 4 g/dL (ref 3.5–5.0)
ALT: 20 U/L (ref 14–54)
AST: 24 U/L (ref 15–41)
Alkaline Phosphatase: 85 U/L (ref 38–126)
Anion gap: 9 (ref 5–15)
BILIRUBIN TOTAL: 0.4 mg/dL (ref 0.3–1.2)
BUN: 14 mg/dL (ref 6–20)
CHLORIDE: 102 mmol/L (ref 101–111)
CO2: 25 mmol/L (ref 22–32)
CREATININE: 0.61 mg/dL (ref 0.44–1.00)
Calcium: 9.7 mg/dL (ref 8.9–10.3)
GFR calc Af Amer: >60 mL/min (ref >60)
GFR calc non Af Amer: >60 mL/min (ref >60)
GLUCOSE: 139 mg/dL — AB (ref 65–99)
POTASSIUM: 3.6 mmol/L (ref 3.5–5.1)
Sodium: 136 mmol/L (ref 135–145)
Total Protein: 7.6 g/dL (ref 6.5–8.1)

## 2017-10-26 LAB — TROPONIN I
Troponin I: <0.03 ng/mL (ref <0.03)
Troponin I: 1.84 ng/mL (ref <0.03)

## 2017-10-26 LAB — POCT ACTIVATED CLOTTING TIME
ACTIVATED CLOTTING TIME: 235 s
ACTIVATED CLOTTING TIME: 268 s

## 2017-10-26 LAB — PROTIME-INR
INR: 0.98
Prothrombin Time: 12.9 seconds (ref 11.4–15.2)

## 2017-10-26 LAB — APTT: APTT: 26 s (ref 24–36)

## 2017-10-26 SURGERY — CORONARY/GRAFT ACUTE MI REVASCULARIZATION

## 2017-10-26 MED ORDER — NOREPINEPHRINE BITARTRATE 1 MG/ML IV SOLN
INTRAVENOUS | Status: AC
  Filled 2017-10-26: qty 4

## 2017-10-26 MED ORDER — HEPARIN (PORCINE) IN NACL 100-0.45 UNIT/ML-% IJ SOLN
800.0000 [IU]/h | INTRAMUSCULAR | Status: DC
  Filled 2017-10-26: qty 250

## 2017-10-26 MED ORDER — TIROFIBAN HCL IN NACL 5-0.9 MG/100ML-% IV SOLN
INTRAVENOUS | Status: AC | PRN
  Administered 2017-10-26: 0.075 ug/kg/min via INTRAVENOUS

## 2017-10-26 MED ORDER — TIROFIBAN HCL IN NACL 5-0.9 MG/100ML-% IV SOLN
INTRAVENOUS | Status: AC
  Filled 2017-10-26: qty 100

## 2017-10-26 MED ORDER — CARVEDILOL 3.125 MG PO TABS
3.1250 mg | ORAL_TABLET | Freq: Two times a day (BID) | ORAL | Status: DC
  Administered 2017-10-26 – 2017-10-28 (×4): 3.125 mg via ORAL
  Filled 2017-10-26 (×4): qty 1

## 2017-10-26 MED ORDER — TIROFIBAN (AGGRASTAT) BOLUS VIA INFUSION
INTRAVENOUS | Status: DC | PRN
Start: 2017-10-26 — End: 2017-10-26
  Administered 2017-10-26: 2110 ug via INTRAVENOUS

## 2017-10-26 MED ORDER — SODIUM CHLORIDE 0.9% FLUSH
3.0000 mL | Freq: Two times a day (BID) | INTRAVENOUS | Status: DC
  Administered 2017-10-26 – 2017-10-28 (×4): 3 mL via INTRAVENOUS

## 2017-10-26 MED ORDER — BIVALIRUDIN TRIFLUOROACETATE 250 MG IV SOLR
INTRAVENOUS | Status: AC
  Filled 2017-10-26: qty 250

## 2017-10-26 MED ORDER — HYDRALAZINE HCL 20 MG/ML IJ SOLN: 5.0000 mg | INTRAMUSCULAR | Status: DC | PRN

## 2017-10-26 MED ORDER — ALBUTEROL SULFATE (2.5 MG/3ML) 0.083% IN NEBU: 3.0000 mL | INHALATION_SOLUTION | Freq: Four times a day (QID) | RESPIRATORY_TRACT | Status: DC | PRN

## 2017-10-26 MED ORDER — ONDANSETRON HCL 4 MG/2ML IJ SOLN: 4.0000 mg | Freq: Four times a day (QID) | INTRAMUSCULAR | Status: DC | PRN

## 2017-10-26 MED ORDER — NITROGLYCERIN 1 MG/10 ML FOR IR/CATH LAB
INTRA_ARTERIAL | Status: DC | PRN
  Administered 2017-10-26 (×2): 200 ug via INTRACORONARY

## 2017-10-26 MED ORDER — IOPAMIDOL (ISOVUE-300) INJECTION 61%
INTRAVENOUS | Status: DC | PRN
  Administered 2017-10-26: 165 mL via INTRA_ARTERIAL

## 2017-10-26 MED ORDER — VERAPAMIL HCL 2.5 MG/ML IV SOLN
INTRAVENOUS | Status: AC
  Filled 2017-10-26: qty 2

## 2017-10-26 MED ORDER — BUDESONIDE 0.25 MG/2ML IN SUSP
0.2500 mg | Freq: Two times a day (BID) | RESPIRATORY_TRACT | Status: DC
  Filled 2017-10-26: qty 2

## 2017-10-26 MED ORDER — NITROGLYCERIN IN D5W 200-5 MCG/ML-% IV SOLN
INTRAVENOUS | Status: AC
  Filled 2017-10-26: qty 250

## 2017-10-26 MED ORDER — SODIUM CHLORIDE 0.9 % IV SOLN: INTRAVENOUS | Status: DC

## 2017-10-26 MED ORDER — SODIUM CHLORIDE 0.9% FLUSH: 3.0000 mL | INTRAVENOUS | Status: DC | PRN

## 2017-10-26 MED ORDER — ENOXAPARIN SODIUM 40 MG/0.4ML ~~LOC~~ SOLN
40.0000 mg | SUBCUTANEOUS | Status: DC
  Administered 2017-10-27 – 2017-10-28 (×2): 40 mg via SUBCUTANEOUS
  Filled 2017-10-26 (×2): qty 0.4

## 2017-10-26 MED ORDER — POTASSIUM CHLORIDE 20 MEQ PO PACK
40.0000 meq | PACK | Freq: Once | ORAL | Status: AC
  Administered 2017-10-26: 40 meq via ORAL
  Filled 2017-10-26: qty 2

## 2017-10-26 MED ORDER — LORATADINE 10 MG PO TABS
10.0000 mg | ORAL_TABLET | Freq: Every day | ORAL | Status: DC
  Administered 2017-10-27 – 2017-10-28 (×2): 10 mg via ORAL
  Filled 2017-10-26 (×2): qty 1

## 2017-10-26 MED ORDER — POTASSIUM CHLORIDE CRYS ER 20 MEQ PO TBCR
40.0000 meq | EXTENDED_RELEASE_TABLET | Freq: Once | ORAL | Status: DC
  Filled 2017-10-26: qty 2

## 2017-10-26 MED ORDER — NITROGLYCERIN 5 MG/ML IV SOLN
INTRAVENOUS | Status: AC
  Filled 2017-10-26: qty 10

## 2017-10-26 MED ORDER — FUROSEMIDE 10 MG/ML IJ SOLN
20.0000 mg | Freq: Once | INTRAMUSCULAR | Status: AC
  Administered 2017-10-26: 20 mg via INTRAVENOUS
  Filled 2017-10-26: qty 2

## 2017-10-26 MED ORDER — ACETAMINOPHEN 325 MG PO TABS
650.0000 mg | ORAL_TABLET | ORAL | Status: DC | PRN
  Administered 2017-10-26 – 2017-10-27 (×3): 650 mg via ORAL
  Filled 2017-10-26 (×3): qty 2

## 2017-10-26 MED ORDER — TIROFIBAN HCL IV 12.5 MG/250 ML
0.0750 ug/kg/min | INTRAVENOUS | Status: AC
  Administered 2017-10-26: 0.075 ug/kg/min via INTRAVENOUS
  Filled 2017-10-26: qty 250

## 2017-10-26 MED ORDER — MIDAZOLAM HCL 2 MG/2ML IJ SOLN
INTRAMUSCULAR | Status: AC
  Filled 2017-10-26: qty 2

## 2017-10-26 MED ORDER — CLOPIDOGREL BISULFATE 75 MG PO TABS
75.0000 mg | ORAL_TABLET | Freq: Every day | ORAL | Status: DC
  Administered 2017-10-27 – 2017-10-28 (×2): 75 mg via ORAL
  Filled 2017-10-26 (×2): qty 1

## 2017-10-26 MED ORDER — ATORVASTATIN CALCIUM 20 MG PO TABS
80.0000 mg | ORAL_TABLET | Freq: Every day | ORAL | Status: DC
  Administered 2017-10-27: 80 mg via ORAL
  Filled 2017-10-26: qty 4

## 2017-10-26 MED ORDER — SODIUM CHLORIDE 0.9 % IV SOLN
250.0000 mL | INTRAVENOUS | Status: DC | PRN
  Administered 2017-10-26: 250 mL via INTRAVENOUS

## 2017-10-26 MED ORDER — FENTANYL CITRATE (PF) 100 MCG/2ML IJ SOLN
INTRAMUSCULAR | Status: DC | PRN
  Administered 2017-10-26 (×2): 25 ug via INTRAVENOUS
  Administered 2017-10-26: 50 ug via INTRAVENOUS

## 2017-10-26 MED ORDER — MIDAZOLAM HCL 2 MG/2ML IJ SOLN
INTRAMUSCULAR | Status: DC | PRN
  Administered 2017-10-26: 0.5 mg via INTRAVENOUS
  Administered 2017-10-26: 1 mg via INTRAVENOUS
  Administered 2017-10-26: 0.5 mg via INTRAVENOUS

## 2017-10-26 MED ORDER — FENTANYL CITRATE (PF) 100 MCG/2ML IJ SOLN
INTRAMUSCULAR | Status: AC
  Filled 2017-10-26: qty 2

## 2017-10-26 MED ORDER — NITROGLYCERIN IN D5W 200-5 MCG/ML-% IV SOLN
INTRAVENOUS | Status: AC | PRN
  Administered 2017-10-26: 10 ug/min via INTRAVENOUS

## 2017-10-26 MED ORDER — BACLOFEN 5 MG HALF TABLET
5.0000 mg | ORAL_TABLET | Freq: Three times a day (TID) | ORAL | Status: DC | PRN
  Filled 2017-10-26: qty 2

## 2017-10-26 MED ORDER — HEPARIN SODIUM (PORCINE) 1000 UNIT/ML IJ SOLN
INTRAMUSCULAR | Status: AC
  Filled 2017-10-26: qty 1

## 2017-10-26 MED ORDER — ASPIRIN 81 MG PO CHEW
81.0000 mg | CHEWABLE_TABLET | Freq: Every day | ORAL | Status: DC
  Administered 2017-10-27 – 2017-10-28 (×2): 81 mg via ORAL
  Filled 2017-10-26 (×2): qty 1

## 2017-10-26 MED ORDER — NITROGLYCERIN IN D5W 200-5 MCG/ML-% IV SOLN: 0.0000 ug/min | INTRAVENOUS | Status: DC

## 2017-10-26 MED ORDER — CLOPIDOGREL BISULFATE 300 MG PO TABS
600.0000 mg | ORAL_TABLET | Freq: Once | ORAL | Status: AC
  Administered 2017-10-26: 600 mg via ORAL
  Filled 2017-10-26: qty 2

## 2017-10-26 MED ORDER — HEPARIN SODIUM (PORCINE) 1000 UNIT/ML IJ SOLN
INTRAMUSCULAR | Status: DC | PRN
  Administered 2017-10-26: 7000 [IU] via INTRAVENOUS
  Administered 2017-10-26: 3000 [IU] via INTRAVENOUS

## 2017-10-26 MED ORDER — HEPARIN BOLUS VIA INFUSION
4000.0000 [IU] | Freq: Once | INTRAVENOUS | Status: DC
  Filled 2017-10-26: qty 4000

## 2017-10-26 MED ORDER — CITALOPRAM HYDROBROMIDE 20 MG PO TABS
20.0000 mg | ORAL_TABLET | Freq: Every day | ORAL | Status: DC
  Administered 2017-10-26 – 2017-10-28 (×3): 20 mg via ORAL
  Filled 2017-10-26 (×3): qty 1

## 2017-10-26 SURGICAL SUPPLY — 13 items
BALLN MINITREK RX 2.0X12 (BALLOONS) ×3
BALLOON MINITREK RX 2.0X12 (BALLOONS) ×1 IMPLANT
CATH INFINITI 5 FR JL3.5 (CATHETERS) ×3 IMPLANT
CATH INFINITI 5FR ANG PIGTAIL (CATHETERS) ×3 IMPLANT
CATH LAUNCHER 6FR EBU3.5 (CATHETERS) ×3 IMPLANT
CATH VISTA GUIDE 6FR JR4 (CATHETERS) ×3 IMPLANT
DEVICE INFLAT 30 PLUS (MISCELLANEOUS) ×3 IMPLANT
DEVICE RAD TR BAND REGULAR (VASCULAR PRODUCTS) ×3 IMPLANT
GLIDESHEATH SLEND SS 6F .021 (SHEATH) ×3 IMPLANT
KIT MANI 3VAL PERCEP (MISCELLANEOUS) ×3 IMPLANT
PACK CARDIAC CATH (CUSTOM PROCEDURE TRAY) ×3 IMPLANT
WIRE ROSEN-J .035X260CM (WIRE) ×3 IMPLANT
WIRE RUNTHROUGH .014X180CM (WIRE) ×3 IMPLANT

## 2017-10-26 NOTE — Progress Notes (Signed)
eLink Physician-Brief Progress Note Patient Name: Angela Mccullough DOB: 11-17-1966 MRN: [id number]   Date of Service  10/26/2017  HPI/Events of Note  73 F with h/o of depression and s/p ASD repair in past presenting with inferolateral STEMI.  Balloon angioplasty of OM3.  Now in ICU.  Alert.  HD stable with BP of 123/78 (91), HR of 98, RR 14 and O2 sats of 98%.    eICU Interventions  Plan of care per admitting cardiologist  Continue to monitor via [facility]     Intervention Category Evaluation Type: New Patient Evaluation  DETERDING,ELIZABETH 10/26/2017, 10:36 PM

## 2017-10-26 NOTE — ED Provider Notes (Signed)
[facility] Emergency Department Provider Note    None    (approximate)  I have reviewed the triage vital signs and the nursing notes.   HISTORY  Chief Complaint Code STEMI    HPI Angela Mccullough is a 51 y.o. female with a history of anxiety depression family history of heart disease as well as being status post ASD repair presents with symptoms that she thought were a panic attack earlier in the day.  States that she took a Xanax with some improvement in symptoms but came back and at that point she called EMS.  Describes the pain is pressure and has been having symptoms consistent with heartburn throughout the day.  When EMS found her protecting her airway and EKG showed evidence of inferior ST elevation MI.  Patient was given aspirin as well as nitro.  Fortunately blood pressure remained stable.  She arrives to the ER still having some persistent discomfort but it has significantly improved.  Describes it as mild to moderate at this point.  No pain radiating through to her back.  Denies any abdominal pain at this time.  No numbness or tingling.  Past Medical History:  Diagnosis Date  . Anxiety and depression   . ASD (atrial septal defect), common atrium (single atrium)   . Heart murmur   . Heartburn   . Kidney stone    Family History  Problem Relation Age of Onset  . Diabetes Mother   . COPD Mother   . Kidney Stones Mother   . Kidney Stones Father   . Kidney disease Neg Hx   . Bladder Cancer Neg Hx    Past Surgical History:  Procedure Laterality Date  . atrium septal defect  1999   Patient Active Problem List   Diagnosis Date Noted  . 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 Allergic rhinitis 03/23/2016  . Kidney stones 03/22/2016  . Hydronephrosis 03/22/2016  . Gross hematuria 03/22/2016  . Chronic fatigue 01/20/2016  . Constipation 01/20/2016  . PTSD (post-traumatic stress disorder) 11/20/2015  . Routine health  maintenance 11/20/2015  . Chronic cough 11/20/2015  . Smoker 10/03/2015  . ANXIETY 07/09/2009  . DEPRESSION 07/09/2009  . GERD 07/09/2009  . VAGINITIS 07/09/2009  . DYSFUNCTIONAL UTERINE BLEEDING 07/09/2009  . NEPHROLITHIASIS, HX OF 07/09/2009      Prior to Admission medications   Medication Sig Start Date End Date Taking? Authorizing Provider  albuterol (PROVENTIL HFA;VENTOLIN HFA) 108 (90 BASE) MCG/ACT inhaler Inhale 2 puffs into the lungs every 6 (six) hours as needed for wheezing or shortness of breath. 11/20/15   Arlis Porta., MD  baclofen (LIORESAL) 10 MG tablet Take 0.5-1 tablets (5-10 mg total) by mouth 3 (three) times daily as needed for muscle spasms. 11/02/16   Karamalegos, Devonne Doughty, DO  cetirizine (ZYRTEC) 10 MG tablet Take 10 mg by mouth daily.    [provider]  Cholecalciferol 2000 units CAPS Take 1 capsule (2,000 Units total) by mouth daily. 01/20/16   Krebs, Genevie Cheshire, NP  citalopram (CELEXA) 20 MG tablet Take 20 mg by mouth daily.    [provider]  diphenhydrAMINE (BENADRYL) 25 MG tablet Take 25 mg by mouth every 4 (four) hours as needed.    [provider]  fluticasone (FLOVENT HFA) 110 MCG/ACT inhaler Inhale 1 puff into the lungs 2 (two) times daily. 11/20/15   Arlis Porta., MD  mometasone (NASONEX) 50 MCG/ACT nasal spray Place 2  sprays into the nose daily. 03/23/16   Luciana Axe, NP  predniSONE (DELTASONE) 20 MG tablet Take 60mg  x 2 days, 40mg  x 2 days, 20mg  x 3 days 11/02/16   Olin Hauser, DO  Psyllium 28.3 % POWD Take 500 mg by mouth daily. 01/20/16   Luciana Axe, NP    Allergies Biaxin [clarithromycin]    Social History Social History  Substance Use Topics  . Smoking status: Current Every Day Smoker    Types: Cigarettes  . Smokeless tobacco: Current User  . Alcohol use 0.0 oz/week     Comment: today    Review of Systems Patient denies headaches, rhinorrhea, blurry vision, numbness,  shortness of breath, chest pain, edema, cough, abdominal pain, nausea, vomiting, diarrhea, dysuria, fevers, rashes or hallucinations unless otherwise stated above in HPI. ____________________________________________   PHYSICAL EXAM:  VITAL SIGNS: Vitals:   10/26/17 2000  BP: 137/89  Pulse: 100  Resp: 14  Temp: 97.7 F (36.5 C)  SpO2: 98%    Constitutional: Alert and oriented.  Eyes: Conjunctivae are normal.  Head: Atraumatic. Nose: No congestion/rhinnorhea. Mouth/Throat: Mucous membranes are moist.   Neck: No stridor. Painless ROM.  Cardiovascular: Normal rate, regular rhythm. Grossly normal heart sounds.  Good peripheral circulation. Respiratory: Normal respiratory effort.  No retractions. Lungs CTAB. Gastrointestinal: Soft and nontender. No distention. No abdominal bruits. No CVA tenderness.  Musculoskeletal: No lower extremity tenderness nor edema.  No joint effusions. Neurologic:  Normal speech and language. No gross focal neurologic deficits are appreciated. No facial droop Skin:  Skin is warm, dry and intact. No rash noted. Psychiatric: Mood and affect are normal. Speech and behavior are normal.  ____________________________________________   LABS (all labs ordered are listed, but only abnormal results are displayed)  Results for orders placed or performed during the hospital encounter of 10/26/17 (from the past 24 hour(s))  CBC with Differential/Platelet     Status: Abnormal   Collection Time: 10/26/17  7:55 PM  Result Value Ref Range   WBC 16.2 (H) 3.6 - 11.0 K/uL   RBC 4.25 3.80 - 5.20 MIL/uL   Hemoglobin 13.3 12.0 - 16.0 g/dL   HCT 38.9 35.0 - 47.0 %   MCV 91.5 80.0 - 100.0 fL   MCH 31.4 26.0 - 34.0 pg   MCHC 34.2 32.0 - 36.0 g/dL   RDW 12.8 11.5 - 14.5 %   Platelets 532 (H) 150 - 440 K/uL   Neutrophils Relative % 80 %   Neutro Abs 13.0 (H) 1.4 - 6.5 K/uL   Lymphocytes Relative 14 %   Lymphs Abs 2.3 1.0 - 3.6 K/uL   Monocytes Relative 6 %   Monocytes  Absolute 0.9 0.2 - 0.9 K/uL   Eosinophils Relative 0 %   Eosinophils Absolute 0.0 0 - 0.7 K/uL   Basophils Relative 0 %   Basophils Absolute 0.1 0 - 0.1 K/uL   ____________________________________________  EKG My review and personal interpretation at Time: 19:58   Indication: chest pain  Rate: 100  Rhythm: sinus Axis: normal Other: inferolateral STEMI, normal intervals ____________________________________________  RADIOLOGY  I personally reviewed all radiographic images ordered to evaluate for the above acute complaints and reviewed radiology reports and findings.  These findings were personally discussed with the patient.  Please see medical record for radiology report.  ____________________________________________   PROCEDURES  Procedure(s) performed:  Procedures    Critical Care performed: yes CRITICAL CARE Performed by: Merlyn Lot   Total critical care time: 10 minutes  Critical care time was exclusive of separately billable procedures and treating other patients.  Critical care was necessary to treat or prevent imminent or life-threatening deterioration.  Critical care was time spent personally by me on the following activities: development of treatment plan with patient and/or surrogate as well as nursing, discussions with consultants, evaluation of patient's response to treatment, examination of patient, obtaining history from patient or surrogate, ordering and performing treatments and interventions, ordering and review of laboratory studies, ordering and review of radiographic studies, pulse oximetry and re-evaluation of patient's condition.  ____________________________________________   INITIAL IMPRESSION / ASSESSMENT AND PLAN / ED COURSE  Pertinent labs & imaging results that were available during my care of the patient were reviewed by me and considered in my medical decision making (see chart for details).  DDX: ACS, pericarditis, esophagitis,  boerhaaves, pe, dissection, pna, bronchitis, costochondritis   Vanesha E Suk is a 51 y.o. who presents to the ED with acute onset chest pain epigastric pain with evidence of acute inferior ST elevation MI.  Patient currently protecting her airway.  Patient received aspirin.  We will hold on any additional nitroglycerin as it is MI.  This is less consistent with dissection.  Will be given a bolus.  Patient evaluated immediately at bedside by Dr. end of cardiology who agrees to take patient to the Cath Lab for emergent PCI.  Have discussed with the patient and available family all diagnostics and treatments performed thus far and all questions were answered to the best of my ability. The patient demonstrates understanding and agreement with plan.        ____________________________________________   FINAL CLINICAL IMPRESSION(S) / ED DIAGNOSES  Final diagnoses:  ST elevation myocardial infarction (STEMI), unspecified artery (HCC)  Chest pain, unspecified type      NEW MEDICATIONS STARTED DURING THIS VISIT:  New Prescriptions   No medications on file     Note:  This document was prepared using Dragon voice recognition software and may include unintentional dictation errors.    Merlyn Lot, MD 10/26/17 2011

## 2017-10-26 NOTE — Progress Notes (Signed)
ANTICOAGULATION CONSULT NOTE - Initial Consult  Pharmacy Consult for heparin Indication: chest pain/ACS  Allergies  Allergen Reactions  . Biaxin [Clarithromycin] Nausea And Vomiting    Patient Measurements: Height: 5\' 2"  (157.5 cm) Weight: 186 lb (84.4 kg) IBW/kg (Calculated) : 50.1 Heparin Dosing Weight: 69.1 kg  Vital Signs: Temp: 97.7 F (36.5 C) (10/30 2000) Temp Source: Oral (10/30 2000) BP: 137/89 (10/30 2000) Pulse Rate: 100 (10/30 2000)  Labs:  Recent Labs  10/26/17 1955  HGB 13.3  HCT 38.9  PLT 532*    CrCl cannot be calculated (Patient's most recent lab result is older than the maximum 21 days allowed.).   Medical History: Past Medical History:  Diagnosis Date  . Anxiety and depression   . ASD (atrial septal defect), common atrium (single atrium)   . Heart murmur   . Heartburn   . Kidney stone     Medications:  Infusions:  . sodium chloride    . heparin      Assessment: 51 yof cc code STEMI. ED ECG showing ST elevation. Pharmacy consulted to dose heparin for ACS/STEMI. Most recent medication list did not show [facility] however this med rec has not been completed due to urgency of procedure. Will start UFH infusion assuming no OAC PTA and will follow up when able.   Goal of Therapy:  Heparin level 0.3-0.7 units/ml Monitor platelets by anticoagulation protocol: Yes   Plan:  Give 4000 units bolus x 1 Start heparin infusion at 800 units/hr Check anti-Xa level in 6 hours and daily while on heparin Continue to monitor H&H and platelets  Laural Benes, Pharm.D., BCPS Clinical Pharmacist 10/26/2017,8:14 PM

## 2017-10-26 NOTE — Progress Notes (Signed)
Aggrastat consult.  Aggrastat started at ~2100 10/30. Ordered to continue at current rate until 1500 10/31.  Sim Boast, PharmD, BCPS  10/26/17 10:53 PM

## 2017-10-26 NOTE — Progress Notes (Signed)
[facility] responded to code STEMI. Patient was brought in by ambulance to emergency room #2. Patient had no family at the time. [facility] will follow-up once family arrives. Patient being cared for by medical staff. Nurse will notify if services needed.   10/26/17 2000  Clinical Encounter Type  Visited With Patient;Health care provider  Visit Type Initial;Spiritual support;Code;ED  Referral From Nurse

## 2017-10-26 NOTE — ED Triage Notes (Addendum)
Patient coming from home via EMS with hx of aniexty and states she thought she was having a panic attack but took her meds but did not get better. EMS gave 324 ASA and 3 nitro sublingual and pain went from 10/10 to 6/10 pt has hx of atrial septal defect repair in 1999. Patient called Code stemi in field. Patient vitals signs 138/88 and HR 100 and 99% on RA. Patient was having some nausea with CP PTA.

## 2017-10-26 NOTE — Consult Note (Signed)
Name: Angela Mccullough MRN: [id number] DOB: 06-Oct-1966    ADMISSION DATE:  10/26/2017  CONSULTATION DATE:  10/26/17  REFERRING MD : Dr. END  CHIEF COMPLAINT:  Chest Pain  BRIEF PATIENT DESCRIPTION: 51 yo female with STEMI s/p left heart coronary angiography.  SIGNIFICANT EVENTS  10/26/17 Patient admitted to the SDU with  STEMI S/P Left heart Cath and coronary angiography  STUDIES:  10/26/17 Cardiac cath>> Pl. Refer to result review tab   HISTORY OF PRESENT ILLNESS:  Angela Mccullough is a 51 year old female with known history of anxiety, depression , s/p ASD repair.  Patient had some chest discomfort but she thought it was anxiety attack.  She took some xanax with some improvement however the symptoms came back.  She called the EMS and brought [facility].  EKG was obtained which was concerning  For Inferior wall MI.   Patient was emergently taken to the cath lab for left heart cath and coronary angiography.  Post procedure patient was placed in the SDU for observation. PAST MEDICAL HISTORY :   has a past medical history of Anxiety and depression; ASD (atrial septal defect), common atrium (single atrium); Heart murmur; Heartburn; and Kidney stone.  has a past surgical history that includes atrium septal defect (1999). Prior to Admission medications   Medication Sig Start Date End Date Taking? Authorizing Provider  albuterol (PROVENTIL HFA;VENTOLIN HFA) 108 (90 BASE) MCG/ACT inhaler Inhale 2 puffs into the lungs every 6 (six) hours as needed for wheezing or shortness of breath. 11/20/15   Arlis Porta., MD  baclofen (LIORESAL) 10 MG tablet Take 0.5-1 tablets (5-10 mg total) by mouth 3 (three) times daily as needed for muscle spasms. 11/02/16   Karamalegos, Devonne Doughty, DO  cetirizine (ZYRTEC) 10 MG tablet Take 10 mg by mouth daily.    [provider]  Cholecalciferol 2000 units CAPS Take 1 capsule (2,000 Units total) by mouth daily. 01/20/16   Krebs, Genevie Cheshire, NP    citalopram (CELEXA) 20 MG tablet Take 20 mg by mouth daily.    [provider]  diphenhydrAMINE (BENADRYL) 25 MG tablet Take 25 mg by mouth every 4 (four) hours as needed.    [provider]  fluticasone (FLOVENT HFA) 110 MCG/ACT inhaler Inhale 1 puff into the lungs 2 (two) times daily. 11/20/15   Arlis Porta., MD  mometasone (NASONEX) 50 MCG/ACT nasal spray Place 2 sprays into the nose daily. 03/23/16   Luciana Axe, NP  predniSONE (DELTASONE) 20 MG tablet Take 60mg  x 2 days, 40mg  x 2 days, 20mg  x 3 days 11/02/16   Olin Hauser, DO  Psyllium 28.3 % POWD Take 500 mg by mouth daily. 01/20/16   Luciana Axe, NP   Allergies  Allergen Reactions  . Biaxin [Clarithromycin] Nausea And Vomiting    FAMILY HISTORY:  family history includes COPD in her mother; Diabetes in her mother; Kidney Stones in her father and mother. SOCIAL HISTORY:  reports that she has been smoking Cigarettes.  She uses smokeless tobacco. She reports that she drinks alcohol. She reports that she does not use drugs.  REVIEW OF SYSTEMS:   Constitutional: Negative for fever, chills, weight loss, malaise/fatigue and diaphoresis.  HENT: Negative for hearing loss, ear pain, nosebleeds, congestion, sore throat, neck pain, tinnitus and ear discharge.   Eyes: Negative for blurred vision, double vision, photophobia, pain, discharge and redness.  Respiratory: Negative for cough, hemoptysis, sputum production, shortness of breath, wheezing  and stridor.   Cardiovascular: Negative for chest pain, palpitations, orthopnea, claudication, leg swelling and PND.  Gastrointestinal: Negative for heartburn, nausea, vomiting, abdominal pain, diarrhea, constipation, blood in stool and melena.  Genitourinary: Negative for dysuria, urgency, frequency, hematuria and flank pain.  Musculoskeletal: Negative for myalgias, back pain, joint pain and falls.  Skin: Negative for itching and rash.  Neurological:  Negative for dizziness, tingling, tremors, sensory change, speech change, focal weakness, seizures, loss of consciousness, weakness and headaches.  Endo/Heme/Allergies: Negative for environmental allergies and polydipsia. Does not bruise/bleed easily.  SUBJECTIVE: Patient states that "her chest pain is much better than before"  VITAL SIGNS: Temp:  [97.7 F (36.5 C)] 97.7 F (36.5 C) (10/30 2000) Pulse Rate:  [95-100] 100 (10/30 2004) Resp:  [14-20] 14 (10/30 2004) BP: (137)/(89) 137/89 (10/30 2000) SpO2:  [98 %-100 %] 100 % (10/30 2004) Weight:  [186 lb (84.4 kg)] 186 lb (84.4 kg) (10/30 2000)  PHYSICAL EXAMINATION: General: Middle aged, Caucasian female in no acute distress Neuro: Awake,Alert and oriented HEENT:  AT,[place],No jvd Cardiovascular:s1s2,regular, no m/r/g Lungs:diminished bibasilar, no wheezes,crackles, rhonchi Abdomen: soft,NT,ND Musculoskeletal: No edema,cyanosis Skin: Warm,dry and intact   Recent Labs Lab 10/26/17 1955  NA 136  K 3.6  CL 102  CO2 25  BUN 14  CREATININE 0.61  GLUCOSE 139*    Recent Labs Lab 10/26/17 1955  HGB 13.3  HCT 38.9  WBC 16.2*  PLT 532*   No results found.  ASSESSMENT / PLAN:  Inferior wall MI, S/P balloon angioplasty of OM3 Hx of Anxiety Ischemic Cardiomyopathy Hyperlipidemia    Plan Continue Aggrastat per cardiology recommendation Follow cardiology recommendation F/U ECHO F/U EKG Trend Troponins Started Coreg Lasix 40 mg X1 Continue aspirin /plavix Continue Atorvastatin Continue aspirin Continue celexa Smoking/Maijuana Cessation Councelling Check UDS   Bincy Varughese,AG-ACNP Pulmonary and [place]   10/26/2017, 9:47 PM  Agree with above. Transfer to telemetry  Merton Border, MD PCCM service Mobile [phone] Pager [phone] 10/27/2017 12:26 PM

## 2017-10-26 NOTE — Consult Note (Signed)
Cardiology Consultation:   Patient ID: Angela Mccullough; [id number]; 1966-10-10   Admit date: 10/26/2017 Date of Consult: 10/26/2017  Primary Care Provider: Olin Hauser, DO Primary Cardiologist: [name] (last saw > 10 years ago) Primary Electrophysiologist:  None   Patient Profile:   Angela Mccullough is a 51 y.o. female with a hx of ASD s/p patch repair in 1999, anxiety, and depression who is being seen today for the evaluation of chest pain and ST segment elevation  at the request of Dr. Quentin Cornwall.  History of Present Illness:   Angela Mccullough was in the process of moving her husband into inpatient hospice this afternoon. She had been feeling nauseated much of the afternoon. At approximately 6:30 PM, she developed severe substernal chest pain radiating to the neck and jaw. EMS was called and found her to have inferolateral ST segment elevation. She was given ASA 324 mg and SL NTG while being transported to [facility]. Upon arrival, she continued to have 6/10 chest pain. She was taken for emergent catheterization, which revealed thrombotic occlusion occlusion of OM3, as well as possible vasospasm or embolization into the distal LAD and ramus. Balloon angioplasty to OM3 was performed; the more proximal thrombus was successfully treated but he distal lesion could not be opened. Given resolution of chest pain and small vessel size, the decision was made not to attempt further intervention.  At this time, Angela Mccullough is asymptomatic. She is on tirofiban and NTG infusions.  Of note, Angela Mccullough smokes ~1 PPD and also smoked marijuana earlier in the day. She denies use of other drugs.  Past Medical History:  Diagnosis Date  . Anxiety and depression   . ASD (atrial septal defect), common atrium (single atrium)   . Heart murmur   . Heartburn   . Kidney stone     Past Surgical History:  Procedure Laterality Date  . atrium septal defect  1999     Home Medications:  Prior to  Admission medications   Medication Sig Start Date Angela Mccullough Date Taking? Authorizing Provider  albuterol (PROVENTIL HFA;VENTOLIN HFA) 108 (90 BASE) MCG/ACT inhaler Inhale 2 puffs into the lungs every 6 (six) hours as needed for wheezing or shortness of breath. 11/20/15   Arlis Porta., MD  baclofen (LIORESAL) 10 MG tablet Take 0.5-1 tablets (5-10 mg total) by mouth 3 (three) times daily as needed for muscle spasms. 11/02/16   Karamalegos, Devonne Doughty, DO  cetirizine (ZYRTEC) 10 MG tablet Take 10 mg by mouth daily.    [provider]  Cholecalciferol 2000 units CAPS Take 1 capsule (2,000 Units total) by mouth daily. 01/20/16   Krebs, Genevie Cheshire, NP  citalopram (CELEXA) 20 MG tablet Take 20 mg by mouth daily.    [provider]  diphenhydrAMINE (BENADRYL) 25 MG tablet Take 25 mg by mouth every 4 (four) hours as needed.    [provider]  fluticasone (FLOVENT HFA) 110 MCG/ACT inhaler Inhale 1 puff into the lungs 2 (two) times daily. 11/20/15   Arlis Porta., MD  mometasone (NASONEX) 50 MCG/ACT nasal spray Place 2 sprays into the nose daily. 03/23/16   Luciana Axe, NP  predniSONE (DELTASONE) 20 MG tablet Take 60mg  x 2 days, 40mg  x 2 days, 20mg  x 3 days 11/02/16   Angela Hauser, DO  Psyllium 28.3 % POWD Take 500 mg by mouth daily. 01/20/16   Luciana Axe, NP    Inpatient Medications: Scheduled Meds: . [START ON 10/27/2017]  aspirin  81 mg Oral Daily  . [START ON 10/27/2017] atorvastatin  80 mg Oral q1800  . [START ON 10/27/2017] budesonide  0.25 mg Nebulization BID  . carvedilol  3.125 mg Oral BID WC  . citalopram  20 mg Oral Daily  . clopidogrel  600 mg Oral Once  . [START ON 10/27/2017] clopidogrel  75 mg Oral Q breakfast  . [START ON 10/27/2017] enoxaparin (LOVENOX) injection  40 mg Subcutaneous Q24H  . [START ON 10/27/2017] loratadine  10 mg Oral Daily  . potassium chloride  40 mEq Oral Once  . sodium chloride flush  3 mL Intravenous Q12H     Continuous Infusions: . sodium chloride    . sodium chloride    . nitroGLYCERIN     PRN Meds:   Allergies:    Allergies  Allergen Reactions  . Biaxin [Clarithromycin] Nausea And Vomiting    Social History:   Social History   Social History  . Marital status: Single    Spouse name: N/A  . Number of children: N/A  . Years of education: N/A   Occupational History  . Not on file.   Social History Main Topics  . Smoking status: Current Every Day Smoker    Packs/day: 1.00    Types: Cigarettes  . Smokeless tobacco: Current User  . Alcohol use 0.0 oz/week     Comment: today  . Drug use: Yes    Types: Marijuana     Comment: Smoked once early today.  Marland Kitchen Sexual activity: Not on file   Other Topics Concern  . Not on file   Social History Narrative  . No narrative on file    Family History:   Family History  Problem Relation Age of Onset  . Diabetes Mother   . COPD Mother   . Kidney Stones Mother   . Coronary artery disease Mother   . Kidney Stones Father   . Coronary artery disease Father   . Colon cancer Father   . Kidney disease Neg Hx   . Bladder Cancer Neg Hx      ROS:  Review of Systems  Constitution: Negative.  HENT: Negative.   Eyes: Negative.   Cardiovascular: Positive for chest pain.  Respiratory: Negative.   Endocrine: Negative.   Hematologic/Lymphatic: Negative.   Skin: Negative.   Musculoskeletal: Negative.   Gastrointestinal: Positive for heartburn and nausea.  Genitourinary: Negative.   Neurological: Negative.        Shaking/rigors before and during catheterization today.  Psychiatric/Behavioral: The patient is nervous/anxious.   Allergic/Immunologic: Negative.      Physical Exam/Data:   Vitals:   10/26/17 2000 10/26/17 2004 10/26/17 2133 10/26/17 2226  BP:   120/89 123/78  Pulse: 95 100 97 (!) 105  Resp: 20 14 18    Temp:   97.7 F (36.5 C)   TempSrc:   Oral   SpO2: 100% 100%    Weight:   186 lb 15.2 oz (84.8 kg)   Height:    5\' 2"  (1.575 m)     Intake/Output Summary (Last 24 hours) at 10/26/17 2249 Last data filed at 10/26/17 2246  Gross per 24 hour  Intake                3 ml  Output              625 ml  Net             -622 ml   [employer]  10/26/17 2000 10/26/17 2133  Weight: 186 lb (84.4 kg) 186 lb 15.2 oz (84.8 kg)   Body mass index is 34.19 kg/m.  General:  Well nourished, well developed, in no acute distress. She appeared anxious and uncomfortable prior to catheterization HEENT: normal Lymph: no adenopathy Neck: no JVD Endocrine:  No thryomegaly Vascular: No carotid bruits; FA pulses 2+ bilaterally without bruits  Cardiac:  normal S1, S2; RRR; no murmur. Lungs:  clear to auscultation bilaterally, no wheezing, rhonchi or rales  Abd: soft, nontender, no hepatomegaly  Ext: no edema. Right radial TR band in place. [place]all puffiness proximal to TR band without hematoma. Coban in place. Musculoskeletal:  No deformities, BUE and BLE strength normal and equal Skin: warm and dry  Neuro:  CNs 2-12 intact, no focal abnormalities noted Psych:  Normal affect   EKG:  The EKG was personally reviewed and demonstrates:  Normal sinus rhythm with inferolateral ST segment elevation. Telemetry:  Telemetry was personally reviewed and demonstrates:  NSR  Relevant CV Studies: LHC/PCI (10/26/17): 1. Inferolateral STEMI with thrombotic occlusion of OM3. 2. Transient occlusion of distal LAD and ramus intermedius noted during procedure. Question vasospasm or embolization. 3. Mild eccentric calcified plaque involving the ostial LAD. 4. Moderately reduced LVEF with mid inferior hypokinesis. 5. Moderately elevated left ventricular filling pressure. 6. Balloon angioplasty of OM3 with resolution of proximal non-occlusive thrombus but inability to open distal vessel. Given small vessel size and resolution of chest pain, further intervention was not attempted.  Laboratory Data:  Chemistry  Recent Labs Lab  10/26/17 1955  NA 136  K 3.6  CL 102  CO2 25  GLUCOSE 139*  BUN 14  CREATININE 0.61  CALCIUM 9.7  GFRNONAA >60  GFRAA >60  ANIONGAP 9     Recent Labs Lab 10/26/17 1955  PROT 7.6  ALBUMIN 4.0  AST 24  ALT 20  ALKPHOS 85  BILITOT 0.4   Hematology  Recent Labs Lab 10/26/17 1955  WBC 16.2*  RBC 4.25  HGB 13.3  HCT 38.9  MCV 91.5  MCH 31.4  MCHC 34.2  RDW 12.8  PLT 532*   Cardiac Enzymes  Recent Labs Lab 10/26/17 1955  TROPONINI <0.03   No results for input(s): TROPIPOC in the last 168 hours.  BNPNo results for input(s): BNP, PROBNP in the last 168 hours.  DDimer No results for input(s): DDIMER in the last 168 hours.  Radiology/Studies:  No results found.  Assessment and Plan:   Inferolateral STEMI Patient's symptoms and EKG are consistent with acute MI, with thrombotic occlusion noted in OM3. This certainly could represent an acute plaque rupture event (type 1 MI). However, given recent stress with her father's illness, and occlusions of the mid LAD (transient) and distal ramus, the possibility of concurrent vasospasm or embolism is a consideration.  Continue tirofiban for 18 hours, if possible. Right arm will need to be monitored closely for bleeding/hematoma.  Load with clopidogrel 600 mg x 1, followed by 75 mg daily thereafter for up to 12 months (if tolerated). ASA 81 mg daily should be continued indefinitely.  Atorvastatin 80 mg daily.  Check hemoglobin A1c.  Smoking and marijuana cessation counseling.  Check urine drug screen.  NTG infusion overnight. Wean as chest pain and BP tolerate. Patient may benefit from addition of long-acting nitrate once off NTG infusion.  Trend troponin until it has peaked, then stop.  Ischemic cardiomyopathy LVEF moderately reduced with mid inferior hypokinesis. LVEDP also moderately elevated.  Start carvedilol 3.125 mg  BID.  Administer furosemide 20 mg IV x 1.  Obtain transthoracic echocardiogram in the  AM to confirm LVEF and evaluate for other structural abnormalities.  Hyperlipidemia LDL found to be 130; goal < 70.  Atorvastatin 80 mg daily.  Anxiety and depression  Continue citalopram.  Further management per hospitalist service.  Dispo:  Admit to ICU, hospitalist service.  Patient's care will be taken over by [facility], as Ms. Leeth has previously seen Dr. [name] and would like to continue to follow with him.   Signed, Nelva Bush, MD  10/26/2017 10:49 PM

## 2017-10-27 ENCOUNTER — Encounter: Payer: Self-pay | Admitting: Internal Medicine

## 2017-10-27 ENCOUNTER — Inpatient Hospital Stay: Admit: 2017-10-27 | Payer: BC Managed Care – PPO

## 2017-10-27 ENCOUNTER — Inpatient Hospital Stay (HOSPITAL_COMMUNITY)
Admit: 2017-10-27 | Discharge: 2017-10-27 | Disposition: A | Discharge status: Home / Self Care | Payer: BC Managed Care – PPO | Attending: Pulmonary Disease | Admitting: Pulmonary Disease

## 2017-10-27 DIAGNOSIS — I340 Nonrheumatic mitral (valve) insufficiency: Secondary | ICD-10-CM

## 2017-10-27 LAB — BASIC METABOLIC PANEL
ANION GAP: 4 — AB (ref 5–15)
BUN: 14 mg/dL (ref 6–20)
CO2: 27 mmol/L (ref 22–32)
Calcium: 8.9 mg/dL (ref 8.9–10.3)
Chloride: 106 mmol/L (ref 101–111)
Creatinine, Ser: 0.57 mg/dL (ref 0.44–1.00)
GFR calc Af Amer: >60 mL/min (ref >60)
GFR calc non Af Amer: >60 mL/min (ref >60)
GLUCOSE: 120 mg/dL — AB (ref 65–99)
POTASSIUM: 3.8 mmol/L (ref 3.5–5.1)
Sodium: 137 mmol/L (ref 135–145)

## 2017-10-27 LAB — HEMOGLOBIN A1C
Hgb A1c MFr Bld: 5.4 % (ref 4.8–5.6)
Mean Plasma Glucose: 108.28 mg/dL

## 2017-10-27 LAB — CBC
HEMATOCRIT: 36.3 % (ref 35.0–47.0)
HEMOGLOBIN: 12.4 g/dL (ref 12.0–16.0)
MCH: 31.2 pg (ref 26.0–34.0)
MCHC: 34.2 g/dL (ref 32.0–36.0)
MCV: 91.1 fL (ref 80.0–100.0)
Platelets: 512 10*3/uL — ABNORMAL HIGH (ref 150–440)
RBC: 3.99 MIL/uL (ref 3.80–5.20)
RDW: 12.9 % (ref 11.5–14.5)
WBC: 11.2 10*3/uL — ABNORMAL HIGH (ref 3.6–11.0)

## 2017-10-27 LAB — GLUCOSE, CAPILLARY: Glucose-Capillary: 118 mg/dL — ABNORMAL HIGH (ref 65–99)

## 2017-10-27 LAB — URINE DRUG SCREEN, QUALITATIVE (ARMC ONLY)
AMPHETAMINES, UR SCREEN: NOT DETECTED
BENZODIAZEPINE, UR SCRN: POSITIVE — AB
Barbiturates, Ur Screen: NOT DETECTED
COCAINE METABOLITE, UR ~~LOC~~: NOT DETECTED
Cannabinoid 50 Ng, Ur ~~LOC~~: NOT DETECTED
MDMA (ECSTASY) UR SCREEN: NOT DETECTED
Methadone Scn, Ur: NOT DETECTED
OPIATE, UR SCREEN: NOT DETECTED
PHENCYCLIDINE (PCP) UR S: NOT DETECTED
Tricyclic, Ur Screen: NOT DETECTED

## 2017-10-27 LAB — TROPONIN I
Troponin I: 22.76 ng/mL (ref <0.03)
Troponin I: 47.18 ng/mL (ref <0.03)

## 2017-10-27 LAB — MRSA PCR SCREENING: MRSA by PCR: POSITIVE — AB

## 2017-10-27 LAB — PREGNANCY, URINE: Preg Test, Ur: NEGATIVE

## 2017-10-27 LAB — MAGNESIUM: Magnesium: 2 mg/dL (ref 1.7–2.4)

## 2017-10-27 MED ORDER — HEART ATTACK BOUNCING BOOK
Freq: Once | Status: AC
  Administered 2017-10-28: 11:00:00

## 2017-10-27 MED ORDER — MUPIROCIN 2 % EX OINT
1.0000 "application " | TOPICAL_OINTMENT | Freq: Two times a day (BID) | CUTANEOUS | Status: DC
  Administered 2017-10-27 – 2017-10-28 (×3): 1 via NASAL
  Filled 2017-10-27 (×2): qty 22

## 2017-10-27 MED ORDER — CALCIUM CARBONATE ANTACID 500 MG PO CHEW
2.0000 | CHEWABLE_TABLET | Freq: Four times a day (QID) | ORAL | Status: DC | PRN
  Administered 2017-10-27: 400 mg via ORAL
  Filled 2017-10-27: qty 2

## 2017-10-27 MED ORDER — CHLORHEXIDINE GLUCONATE CLOTH 2 % EX PADS
6.0000 | MEDICATED_PAD | Freq: Every day | CUTANEOUS | Status: DC
  Administered 2017-10-27 – 2017-10-28 (×2): 6 via TOPICAL

## 2017-10-27 NOTE — Progress Notes (Signed)
Transferred out of ICU, no distress on ra.  Cardiac monitor placed on pt and verified.  Oriented to room and surroundings, POC reviewed with pt and family.  CB in reach, SR up x 2.

## 2017-10-27 NOTE — Progress Notes (Signed)
Report called to Tanya on 2A; pt on room air, VSS, no CP, will transfer via wheelchair, belongings and chart and meds will transfer with her.

## 2017-10-27 NOTE — H&P (Addendum)
[facility] [place] NAME: Angela Mccullough    MR#:  [id number]  DATE OF BIRTH:  1966-05-18  DATE OF ADMISSION:  10/26/2017  PRIMARY CARE PHYSICIAN: Olin Hauser, DO   REQUESTING/REFERRING PHYSICIAN: End, MD  CHIEF COMPLAINT:   Chief Complaint  Patient presents with  . Code STEMI    HISTORY OF PRESENT ILLNESS:  Angela Mccullough  is a 51 y.o. female who presents with chest pain and found to have STEMI in the ED.  She was taken to the Cath Lab where intervention was performed.  The patient was able to have one lesion open by PCI, but was not amenable to stenting.  2 other smaller lesions resolved during the catheterization procedure.  She was moved up to the ICU chest pain free, and hospitalists were called for admission.  PAST MEDICAL HISTORY:   Past Medical History:  Diagnosis Date  . Anxiety and depression   . ASD (atrial septal defect), common atrium (single atrium)   . Heart murmur   . Heartburn   . Kidney stone     PAST SURGICAL HISTORY:   Past Surgical History:  Procedure Laterality Date  . atrium septal defect  1999    SOCIAL HISTORY:   Social History  Substance Use Topics  . Smoking status: Current Every Day Smoker    Packs/day: 1.00    Types: Cigarettes  . Smokeless tobacco: Current User  . Alcohol use 0.0 oz/week     Comment: today    FAMILY HISTORY:   Family History  Problem Relation Age of Onset  . Diabetes Mother   . COPD Mother   . Kidney Stones Mother   . Coronary artery disease Mother   . Kidney Stones Father   . Coronary artery disease Father   . Colon cancer Father   . Kidney disease Neg Hx   . Bladder Cancer Neg Hx     DRUG ALLERGIES:   Allergies  Allergen Reactions  . Biaxin [Clarithromycin] Nausea And Vomiting    MEDICATIONS AT HOME:   Prior to Admission medications   Medication Sig Start Date End Date Taking? Authorizing Provider  albuterol (PROVENTIL  HFA;VENTOLIN HFA) 108 (90 BASE) MCG/ACT inhaler Inhale 2 puffs into the lungs every 6 (six) hours as needed for wheezing or shortness of breath. 11/20/15   Arlis Porta., MD  baclofen (LIORESAL) 10 MG tablet Take 0.5-1 tablets (5-10 mg total) by mouth 3 (three) times daily as needed for muscle spasms. 11/02/16   Karamalegos, Devonne Doughty, DO  cetirizine (ZYRTEC) 10 MG tablet Take 10 mg by mouth daily.    [provider]  Cholecalciferol 2000 units CAPS Take 1 capsule (2,000 Units total) by mouth daily. 01/20/16   Krebs, Genevie Cheshire, NP  citalopram (CELEXA) 20 MG tablet Take 20 mg by mouth daily.    [provider]  diphenhydrAMINE (BENADRYL) 25 MG tablet Take 25 mg by mouth every 4 (four) hours as needed.    [provider]  fluticasone (FLOVENT HFA) 110 MCG/ACT inhaler Inhale 1 puff into the lungs 2 (two) times daily. 11/20/15   Arlis Porta., MD  mometasone (NASONEX) 50 MCG/ACT nasal spray Place 2 sprays into the nose daily. 03/23/16   Luciana Axe, NP  predniSONE (DELTASONE) 20 MG tablet Take 60mg  x 2 days, 40mg  x 2 days, 20mg  x 3 days 11/02/16   Olin Hauser, DO  Psyllium 28.3 % POWD Take 500  mg by mouth daily. 01/20/16   Luciana Axe, NP    REVIEW OF SYSTEMS:  Review of Systems  Constitutional: Negative for chills, fever, malaise/fatigue and weight loss.  HENT: Negative for ear pain, hearing loss and tinnitus.   Eyes: Negative for blurred vision, double vision, pain and redness.  Respiratory: Negative for cough, hemoptysis and shortness of breath.   Cardiovascular: Positive for chest pain (Resolving post cath). Negative for palpitations, orthopnea and leg swelling.  Gastrointestinal: Negative for abdominal pain, constipation, diarrhea, nausea and vomiting.  Genitourinary: Negative for dysuria, frequency and hematuria.  Musculoskeletal: Negative for back pain, joint pain and neck pain.  Skin:       No acne, rash, or lesions   Neurological: Negative for dizziness, tremors, focal weakness and weakness.  Endo/Heme/Allergies: Negative for polydipsia. Does not bruise/bleed easily.  Psychiatric/Behavioral: Negative for depression. The patient is not nervous/anxious and does not have insomnia.      VITAL SIGNS:   Vitals:   10/26/17 2000 10/26/17 2004 10/26/17 2133 10/26/17 2226  BP:   120/89 123/78  Pulse: 95 100 97 (!) 105  Resp: 20 14 18    Temp:   97.7 F (36.5 C)   TempSrc:   Oral   SpO2: 100% 100%    Weight:   84.8 kg (186 lb 15.2 oz)   Height:   5\' 2"  (1.575 m)    Wt Readings from Last 3 Encounters:  10/26/17 84.8 kg (186 lb 15.2 oz)  11/02/16 86.6 kg (191 lb)  03/23/16 82.6 kg (182 lb)    PHYSICAL EXAMINATION:  Physical Exam  Vitals reviewed. Constitutional: She is oriented to person, place, and time. She appears well-developed and well-nourished. No distress.  HENT:  Head: Normocephalic and atraumatic.  Mouth/Throat: Oropharynx is clear and moist.  Eyes: Pupils are equal, round, and reactive to light. Conjunctivae and EOM are normal. No scleral icterus.  Neck: Normal range of motion. Neck supple. No JVD present. No thyromegaly present.  Cardiovascular: Normal rate, regular rhythm and intact distal pulses.  Exam reveals no gallop and no friction rub.   No murmur heard. Respiratory: Effort normal and breath sounds normal. No respiratory distress. She has no wheezes. She has no rales.  GI: Soft. Bowel sounds are normal. She exhibits no distension. There is no tenderness.  Musculoskeletal: Normal range of motion. She exhibits no edema.  No arthritis, no gout  Lymphadenopathy:    She has no cervical adenopathy.  Neurological: She is alert and oriented to person, place, and time. No cranial nerve deficit.  No dysarthria, no aphasia  Skin: Skin is warm and dry. No rash noted. No erythema.  Psychiatric: She has a normal mood and affect. Her behavior is normal. Judgment and thought content normal.     LABORATORY PANEL:   CBC  Recent Labs Lab 10/26/17 1955  WBC 16.2*  HGB 13.3  HCT 38.9  PLT 532*   ------------------------------------------------------------------------------------------------------------------  Chemistries   Recent Labs Lab 10/26/17 1955  NA 136  K 3.6  CL 102  CO2 25  GLUCOSE 139*  BUN 14  CREATININE 0.61  CALCIUM 9.7  AST 24  ALT 20  ALKPHOS 85  BILITOT 0.4   ------------------------------------------------------------------------------------------------------------------  Cardiac Enzymes  Recent Labs Lab 10/26/17 2200  TROPONINI 1.84*   ------------------------------------------------------------------------------------------------------------------  RADIOLOGY:  No results found.  EKG:   Orders placed or performed during the hospital encounter of 10/26/17  . ED EKG  . ED EKG  . EKG 12-Lead  . EKG  12-Lead  . EKG 12-Lead immediately post procedure  . EKG 12-Lead  . EKG 12-Lead immediately post procedure  . EKG 12-Lead    IMPRESSION AND PLAN:  Principal Problem:   STEMI (ST elevation myocardial infarction) ([place]) -status post PCI, on Aggrastat and Plavix, remain in ICU tonight, cardiology consult to cardiology clinic Active Problems:   Anxiety with depression -on meds for this, monitor and treat as needed   GERD -on meds for this at home, treat as needed  All the records are reviewed and case discussed with ED provider. Management plans discussed with the patient and/or family.  DVT PROPHYLAXIS: SubQ lovenox  GI PROPHYLAXIS: None  ADMISSION STATUS: Inpatient  CODE STATUS: Full    Code Status Orders        Start     Ordered   10/26/17 2148  Full code  Continuous     10/26/17 2148    Code Status History    Date Active Date Inactive Code Status Order ID Comments User Context   This patient has a current code status but no historical code status.      TOTAL TIME TAKING CARE OF THIS PATIENT: 45 minutes.    Simmie Garin [place] 10/27/2017, 12:09 AM  [facility]  Office  [phone]  CC: Primary care physician; Olin Hauser, DO  Note:  This document was prepared using Dragon voice recognition software and may include unintentional dictation errors.

## 2017-10-27 NOTE — Progress Notes (Signed)
Patient had her 3rd run of V-Tach since approximately 0200. NP made aware. Added magnesium level on to labs drawn this morning. Lab notified.

## 2017-10-27 NOTE — Progress Notes (Signed)
Patient currently resting in bed. Patient remains on IV Aggrenox and Nitro drip. States pain is currently 1/10. Patient tolerated a small meal upon arrival to the unit. Family was in to visit. TR band was successfully removed without bleeding. Gauze dressing placed. Hematoma on right forearm above TR site. Coban wrapped around area. Arm is currently elevated. CCMD called and patient had a 5 beat run of V-Tach at 0322. Remains with occasional PVCs or bigeminal PVCs. Adequate urine output. Safety maintained and will continue to monitor.

## 2017-10-27 NOTE — Plan of Care (Signed)
Problem: Safety: Goal: Ability to remain free from injury will improve Outcome: Progressing Fall precautions in place, non skid socks when oob  Problem: Pain Managment: Goal: General experience of comfort will improve Outcome: Not Progressing Prn medications  Problem: Tissue Perfusion: Goal: Risk factors for ineffective tissue perfusion will decrease Outcome: Progressing Remains  On Aggrastat gtt  Problem: Cardiovascular: Goal: Ability to achieve and maintain adequate cardiovascular perfusion will improve Outcome: Progressing Rt radial site looks good, small amount of bruising noted

## 2017-10-27 NOTE — Consult Note (Signed)
[facility] Consultation Note  Patient ID: Angela Mccullough, MRN: [id number], DOB/AGE: 1966/11/08 51 y.o. Admit date: 10/26/2017   Date of Consult: 10/27/2017 Primary Physician: Olin Hauser, DO Primary Cardiologist: [name]  Chief Complaint:  Chief Complaint  Patient presents with  . Code STEMI   Reason for Consult: myocardial infarction  HPI: 51 y.o. female with the essential hypertension mixed hyperlipidemia with the significant tobacco abuse of over the last many years. The patient has had significant stress and anxiety recently for which shows she has had new onset of ST elevation myocardial infarction. At that time she was taken to the cardiac catheterization lab showing multiple possible spasm in multiple arteries but significant thrombosis of the obtuse marginal 3. The patient underwent a PCI but no stent placement of obtuse marginal 3 with success and then was placed on tirofiban over the last many hours. The patient feels much better EKG changes are completely resolved with some elevation of troponin and LV systolic dysfunction by cardiac catheterization and LV gram. The patient feels much better today but is slightly sore and short of breath. We have discussed at length possible concerns of tobacco abuse and further medical management listed below  Past Medical History:  Diagnosis Date  . Anxiety and depression   . ASD (atrial septal defect), common atrium (single atrium)   . Heart murmur   . Heartburn   . Kidney stone       Surgical History:  Past Surgical History:  Procedure Laterality Date  . atrium septal defect  1999  . CORONARY/GRAFT ACUTE MI REVASCULARIZATION N/A 10/26/2017   Procedure: Coronary/Graft Acute MI Revascularization;  Surgeon: Nelva Bush, MD;  Location: [facility];  Service: Cardiovascular;  Laterality: N/A;  . LEFT HEART CATH AND CORONARY ANGIOGRAPHY N/A 10/26/2017   Procedure: LEFT HEART CATH AND CORONARY  ANGIOGRAPHY;  Surgeon: Nelva Bush, MD;  Location: [facility];  Service: Cardiovascular;  Laterality: N/A;     Home Meds: Prior to Admission medications   Medication Sig Start Date End Date Taking? Authorizing Provider  albuterol (PROVENTIL HFA;VENTOLIN HFA) 108 (90 BASE) MCG/ACT inhaler Inhale 2 puffs into the lungs every 6 (six) hours as needed for wheezing or shortness of breath. 11/20/15   Arlis Porta., MD  baclofen (LIORESAL) 10 MG tablet Take 0.5-1 tablets (5-10 mg total) by mouth 3 (three) times daily as needed for muscle spasms. 11/02/16   Karamalegos, Devonne Doughty, DO  cetirizine (ZYRTEC) 10 MG tablet Take 10 mg by mouth daily.    [provider]  Cholecalciferol 2000 units CAPS Take 1 capsule (2,000 Units total) by mouth daily. 01/20/16   Krebs, Genevie Cheshire, NP  citalopram (CELEXA) 20 MG tablet Take 20 mg by mouth daily.    [provider]  diphenhydrAMINE (BENADRYL) 25 MG tablet Take 25 mg by mouth every 4 (four) hours as needed.    [provider]  fluticasone (FLOVENT HFA) 110 MCG/ACT inhaler Inhale 1 puff into the lungs 2 (two) times daily. 11/20/15   Arlis Porta., MD  mometasone (NASONEX) 50 MCG/ACT nasal spray Place 2 sprays into the nose daily. 03/23/16   Luciana Axe, NP  predniSONE (DELTASONE) 20 MG tablet Take 60mg  x 2 days, 40mg  x 2 days, 20mg  x 3 days 11/02/16   Olin Hauser, DO  Psyllium 28.3 % POWD Take 500 mg by mouth daily. 01/20/16   Luciana Axe, NP    Inpatient Medications:  .  aspirin  81 mg Oral Daily  . atorvastatin  80 mg Oral q1800  . carvedilol  3.125 mg Oral BID WC  . citalopram  20 mg Oral Daily  . clopidogrel  75 mg Oral Q breakfast  . enoxaparin (LOVENOX) injection  40 mg Subcutaneous Q24H  . loratadine  10 mg Oral Daily  . sodium chloride flush  3 mL Intravenous Q12H   . sodium chloride    . sodium chloride 250 mL (10/27/17 0800)  . nitroGLYCERIN 5 mcg/min (10/27/17 0800)  .  tirofiban 0.075 mcg/kg/min (10/27/17 0800)    Allergies:  Allergies  Allergen Reactions  . Biaxin [Clarithromycin] Nausea And Vomiting    Social History   Social History  . Marital status: Single    Spouse name: N/A  . Number of children: N/A  . Years of education: N/A   Occupational History  . Not on file.   Social History Main Topics  . Smoking status: Current Every Day Smoker    Packs/day: 1.00    Types: Cigarettes  . Smokeless tobacco: Current User  . Alcohol use 0.0 oz/week     Comment: today  . Drug use: Yes    Types: Marijuana     Comment: Smoked once early today.  Marland Kitchen Sexual activity: Not on file   Other Topics Concern  . Not on file   Social History Narrative  . No narrative on file     Family History  Problem Relation Age of Onset  . Diabetes Mother   . COPD Mother   . Kidney Stones Mother   . Coronary artery disease Mother   . Kidney Stones Father   . Coronary artery disease Father   . Colon cancer Father   . Kidney disease Neg Hx   . Bladder Cancer Neg Hx      Review of Systems Positive for soreness shortness of breath Negative for: General:  chills, fever, night sweats or weight changes.  Cardiovascular: PND orthopnea syncope dizziness  Dermatological skin lesions rashes Respiratory: Cough congestion Urologic: Frequent urination urination at night and hematuria Abdominal: negative for nausea, vomiting, diarrhea, bright red blood per rectum, melena, or hematemesis Neurologic: negative for visual changes, and/or hearing changes  All other systems reviewed and are otherwise negative except as noted above.  Labs:  Recent Labs  10/26/17 1955 10/26/17 2200 10/27/17 0402  TROPONINI <0.03 1.84* 22.76*   Lab Results  Component Value Date   WBC 11.2 (H) 10/27/2017   HGB 12.4 10/27/2017   HCT 36.3 10/27/2017   MCV 91.1 10/27/2017   PLT 512 (H) 10/27/2017    Recent Labs Lab 10/26/17 1955 10/27/17 0402  NA 136 137  K 3.6 3.8  CL 102  106  CO2 25 27  BUN 14 14  CREATININE 0.61 0.57  CALCIUM 9.7 8.9  PROT 7.6  --   BILITOT 0.4  --   ALKPHOS 85  --   ALT 20  --   AST 24  --   GLUCOSE 139* 120*   Lab Results  Component Value Date   CHOL 213 (H) 10/26/2017   HDL 49 10/26/2017   LDLCALC 130 (H) 10/26/2017   TRIG 172 (H) 10/26/2017   No results found for: DDIMER  Radiology/Studies:  No results found.  EKG: Normal sinus rhythm  Weights: Filed Weights   10/26/17 2000 10/26/17 2133  Weight: 84.4 kg (186 lb) 84.8 kg (186 lb 15.2 oz)     Physical Exam: Blood pressure 104/76, pulse 90,  temperature (!) 97.5 F (36.4 C), temperature source Oral, resp. rate 18, height 5\' 2"  (1.575 m), weight 84.8 kg (186 lb 15.2 oz), SpO2 98 %. Body mass index is 34.19 kg/m. General: Well developed, well nourished, in no acute distress. Head eyes ears nose throat: Normocephalic, atraumatic, sclera non-icteric, no xanthomas, nares are without discharge. No apparent thyromegaly and/or mass  Lungs: Normal respiratory effort.  no wheezes, no rales, no rhonchi.  Heart: RRR with normal S1 S2. no murmur gallop, no rub, PMI is normal size and placement, carotid upstroke normal without bruit, jugular venous pressure is normal Abdomen: Soft, non-tender, non-distended with normoactive bowel sounds. No hepatomegaly. No rebound/guarding. No obvious abdominal masses. Abdominal aorta is normal size without bruit Extremities: No edema. no cyanosis, no clubbing, no ulcers  Peripheral : 2+ bilateral upper extremity pulses, 2+ bilateral femoral pulses, 2+ bilateral dorsal pedal pulse Neuro: Alert and oriented. No facial asymmetry. No focal deficit. Moves all extremities spontaneously. Musculoskeletal: Normal muscle tone without kyphosis Psych:  Responds to questions appropriately with a normal affect.    Assessment: 51 year old female with acute ST elevation myocardial infarction with hyperlipidemia hypertension and LV dysfunction and tobacco  abuse now improving  Plan: 1. Aspirin and Plavix for further risk reduction of further myocardial infarction and thrombosis 2. Okay for discontinuation of tirofiban this a.m. 3. Carvedilol low-dose due to hypotension for treatment of LV systolic dysfunction and ST elevation myocardial infarction 4. Vernard Gambles was counseled on the discontinuation and abstinence of tobacco abuse 5. Okay to transfer to telemetry 6. High intensity cholesterol therapy 7. Begin ambulation and physical therapy with possible discharged home tomorrow  Signed, Corey Skains M.D. [facility] 10/27/2017, 8:34 AM

## 2017-10-28 LAB — ECHOCARDIOGRAM COMPLETE
HEIGHTINCHES: 62 in
WEIGHTICAEL: 2991.2 [oz_av]

## 2017-10-28 MED ORDER — CLOPIDOGREL BISULFATE 75 MG PO TABS: 75.0000 mg | ORAL_TABLET | Freq: Every day | ORAL | 1 refills | Status: DC

## 2017-10-28 MED ORDER — CARVEDILOL 3.125 MG PO TABS: 3.1250 mg | ORAL_TABLET | Freq: Two times a day (BID) | ORAL | 1 refills | Status: DC

## 2017-10-28 MED ORDER — ASPIRIN 81 MG PO CHEW: 81.0000 mg | CHEWABLE_TABLET | Freq: Every day | ORAL | 1 refills | Status: AC

## 2017-10-28 MED ORDER — ATORVASTATIN CALCIUM 80 MG PO TABS: 80.0000 mg | ORAL_TABLET | Freq: Every day | ORAL | 1 refills | Status: DC

## 2017-10-28 NOTE — Progress Notes (Signed)
Discharge to home with her boyfriend.  Education regarding Stemi and Stemi prevention by me and Cardiac Navigator.

## 2017-10-28 NOTE — Progress Notes (Signed)
Nutrition Education Note  RD consulted for nutrition education regarding a Heart Healthy diet.   Lipid Panel     Component Value Date/Time   CHOL 213 (H) 10/26/2017 1955   CHOL 214 (H) 11/26/2015 0806   TRIG 172 (H) 10/26/2017 1955   HDL 49 10/26/2017 1955   HDL 63 11/26/2015 0806   CHOLHDL 4.3 10/26/2017 1955   VLDL 34 10/26/2017 1955   LDLCALC 130 (H) 10/26/2017 1955   LDLCALC 128 (H) 11/26/2015 0806    RD provided "Heart Healthy Nutrition Therapy" handout from the Academy of Nutrition and Dietetics. Reviewed patient's dietary recall. Provided examples on ways to decrease sodium and fat intake in diet. Discouraged intake of processed foods and use of salt shaker. Encouraged fresh fruits and vegetables as well as whole grain sources of carbohydrates to maximize fiber intake. Teach back method used.  Expect good compliance.  Body mass index is 34.19 kg/m. Pt meets criteria for obese based on current BMI.  Current diet order is heart healthy, patient is consuming approximately 75-100% of meals at this time. Labs and medications reviewed. No further nutrition interventions warranted at this time. RD contact information provided. If additional nutrition issues arise, please re-consult RD.  Koleen Distance MS, RD, LDN Pager #- [phone] After Hours Pager: [phone]

## 2017-10-28 NOTE — Progress Notes (Signed)
Patient referred to Cardiac Rehab with dx of STEMI.   At that time she was taken to the cardiac catheterization lab showing multiple possible spasm in multiple arteries but significant thrombosis of the obtuse marginal 3. The patient underwent a PCI but no stent placement of obtuse marginal 3 with success.    Hx of HTN, HLD, and tobacco abuse.  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  Patient has had significant stress and anxiety, as she has been on intermittent FMLA due to her father's illness and recent admission to Hospice.    This RN reviewed the definition of CAD with patient as well as the location of her CAD.   "Bouncing Back after Heart Attack" booklet given and reviewed with patient.  Discussed modifiable risk factors for heart disease. Discussed the importance of controlling BP and Cholesterol; smoking cessation; following heart healthy diet; maintaining healthy weight;  taking cardiac medications as prescribed; and exercise.  Patient reports smoking no more than one pack per day and when she is working she smokes much less as she cannot smoke in the classroom.  Dietitian Consult ordered for education on heart healthy diet.  Reviewed cardiac medications with patient - rationale for taking, mechanism of action, as well as side effects.  Discussed smoking cessation with patient.  Explained the effects of smoking on the body and the CV system.  Tips for quitting reviewed with patient.  Exercise discussed.  Patient does not currently exercise.  Patient stated she used to walk on a regular basis.  Explained to patient she has been referred to Cardiac Rehab program by her cardiologist.  An overview of the program was provided.  Patient is scheduled to attend Cardiac Rehab on 11/08/2017 at 10:30 a.m.  Orientation packet given and reviewed with patient.     Roanna Epley, RN, BSN, [facility] Pulmonary Nurse Navigator

## 2017-10-28 NOTE — Discharge Summary (Signed)
MI / NSTEMI Discharge Checklist  Aspirin prescribed at discharge:   yes  High Intensity Statin Prescribed? yes  Beta Blocker Prescribed: coreg ADP Receptor Inhibitor Prescribed? (i.e. Plavix etc.- Includes Medically Managed Patients):  plavix   Was EF assessed during THIS hospitalization?yes results pending (YES = Measured in current episode of care or document plan to evaluate after discharge.)  Was EF < 40%?   Pending results For EF < 40%, was ACE/ARB prescribed?  For EF < 40%, was Aldosterone Inhibitor prescribed?   (If contraindicated, provide explanation.)    Was Cardiac Rehab Phase II ordered prior to discharge? (Includes Medically Managed Patients): yes Angela Mccullough at [facility] [facility]    MR#:  [id number]  Anderson:  Apr 07, 1966  DATE OF ADMISSION:  10/26/2017 ADMITTING PHYSICIAN: Lance Coon, MD  DATE OF DISCHARGE: 10/28/2017 PRIMARY CARE PHYSICIAN: Olin Hauser, DO    ADMISSION DIAGNOSIS:  ST elevation myocardial infarction (STEMI), unspecified artery ([place]) [I21.3] Chest pain, unspecified type [R07.9] STEMI (ST elevation myocardial infarction) ([place]) [I21.3]  DISCHARGE DIAGNOSIS:  Acute STEMI s/p PCI but no stent placement of obtuse marginal 3 with success   SECONDARY DIAGNOSIS:   Past Medical History:  Diagnosis Date  . Anxiety and depression   . ASD (atrial septal defect), common atrium (single atrium)   . Heart murmur   . Heartburn   . Kidney stone     HOSPITAL COURSE:  Angela Mccullough  is a 51 y.o. female who presents with chest pain and found to have STEMI in the ED.  She was taken to the Cath Lab where intervention was performed.  The patient was able to have one lesion open by PCI, but was not amenable to stenting.  2 other smaller lesions resolved during the catheterization procedure.  1. Acute STEMI (ST elevation myocardial infarction) ([place]) -status post PCI but no stent  placement of obtuse marginal 3 with success   - Pt was on Aggrastat and Plavix, -now on asa, coreg, lipitor, plavix, cardiac rehab -BP very soft--cannot give ACE -ECHO done results pending  2.  Anxiety with depression -on meds for this, monitor and treat as needed  3.  GERD -on meds for this at home, treat as needed  Overall stable for d/c   CONSULTS OBTAINED:    DRUG ALLERGIES:   Allergies  Allergen Reactions  . Biaxin [Clarithromycin] Nausea And Vomiting    DISCHARGE MEDICATIONS:   Current Discharge Medication List    START taking these medications   Details  aspirin 81 MG chewable tablet Chew 1 tablet (81 mg total) by mouth daily. Qty: 30 tablet, Refills: 1    atorvastatin (LIPITOR) 80 MG tablet Take 1 tablet (80 mg total) by mouth daily at 6 PM. Qty: 30 tablet, Refills: 1    carvedilol (COREG) 3.125 MG tablet Take 1 tablet (3.125 mg total) by mouth 2 (two) times daily with a meal. Qty: 60 tablet, Refills: 1    clopidogrel (PLAVIX) 75 MG tablet Take 1 tablet (75 mg total) by mouth daily with breakfast. Qty: 30 tablet, Refills: 1      CONTINUE these medications which have NOT CHANGED   Details  citalopram (CELEXA) 20 MG tablet Take 20 mg by mouth daily.    albuterol (PROVENTIL HFA;VENTOLIN HFA) 108 (90 BASE) MCG/ACT inhaler Inhale 2 puffs into the lungs every 6 (six) hours as needed for wheezing or shortness of breath. Qty: 1 Inhaler, Refills: 12  Associated Diagnoses: Chronic cough    baclofen (LIORESAL) 10 MG tablet Take 0.5-1 tablets (5-10 mg total) by mouth 3 (three) times daily as needed for muscle spasms. Qty: 30 each, Refills: 1   Associated Diagnoses: Chronic right-sided low back pain with right-sided sciatica    cetirizine (ZYRTEC) 10 MG tablet Take 10 mg by mouth daily.    Cholecalciferol 2000 units CAPS Take 1 capsule (2,000 Units total) by mouth daily. Qty: 30 each   Associated Diagnoses: Chronic fatigue    diphenhydrAMINE (BENADRYL) 25 MG  tablet Take 25 mg by mouth every 4 (four) hours as needed.    fluticasone (FLOVENT HFA) 110 MCG/ACT inhaler Inhale 1 puff into the lungs 2 (two) times daily. Qty: 1 Inhaler, Refills: 12   Associated Diagnoses: Chronic cough    mometasone (NASONEX) 50 MCG/ACT nasal spray Place 2 sprays into the nose daily. Qty: 17 g, Refills: 12   Associated Diagnoses: Allergic rhinitis due to pollen    Psyllium 28.3 % POWD Take 500 mg by mouth daily. Qty: 283 g      STOP taking these medications     predniSONE (DELTASONE) 20 MG tablet         If you experience worsening of your admission symptoms, develop shortness of breath, life threatening emergency, suicidal or homicidal thoughts you must seek medical attention immediately by calling 911 or calling your MD immediately  if symptoms less severe.  You Must read complete instructions/literature along with all the possible adverse reactions/side effects for all the Medicines you take and that have been prescribed to you. Take any new Medicines after you have completely understood and accept all the possible adverse reactions/side effects.   Please note  You were cared for by a hospitalist during your hospital stay. If you have any questions about your discharge medications or the care you received while you were in the hospital after you are discharged, you can call the unit and asked to speak with the hospitalist on call if the hospitalist that took care of you is not available. Once you are discharged, your primary care physician will handle any further medical issues. Please note that NO REFILLS for any discharge medications will be authorized once you are discharged, as it is imperative that you return to your primary care physician (or establish a relationship with a primary care physician if you do not have one) for your aftercare needs so that they can reassess your need for medications and monitor your lab values. Today   SUBJECTIVE   Doing  well no complaints  VITAL SIGNS:  Blood pressure 99/60, pulse 75, temperature 97.9 F (36.6 C), temperature source Oral, resp. rate 20, height 5\' 2"  (1.575 m), weight 84.8 kg (186 lb 15.2 oz), SpO2 96 %.  I/O:   Intake/Output Summary (Last 24 hours) at 10/28/17 1143 Last data filed at 10/27/17 1700  Gross per 24 hour  Intake           286.86 ml  Output              700 ml  Net          -413.14 ml    PHYSICAL EXAMINATION:  GENERAL:  51 y.o.-year-old patient lying in the bed with no acute distress.  EYES: Pupils equal, round, reactive to light and accommodation. No scleral icterus. Extraocular muscles intact.  HEENT: Head atraumatic, normocephalic. Oropharynx and nasopharynx clear.  NECK:  Supple, no jugular venous distention. No thyroid enlargement, no tenderness.  LUNGS: Normal breath sounds bilaterally, no wheezing, rales,rhonchi or crepitation. No use of accessory muscles of respiration.  CARDIOVASCULAR: S1, S2 normal. No murmurs, rubs, or gallops.  ABDOMEN: Soft, non-tender, non-distended. Bowel sounds present. No organomegaly or mass.  EXTREMITIES: No pedal edema, cyanosis, or clubbing.  NEUROLOGIC: Cranial nerves II through XII are intact. Muscle strength 5/5 in all extremities. Sensation intact. Gait not checked.  PSYCHIATRIC: The patient is alert and oriented x 3.  SKIN: No obvious rash, lesion, or ulcer.   DATA REVIEW:   CBC   Recent Labs Lab 10/27/17 0402  WBC 11.2*  HGB 12.4  HCT 36.3  PLT 512*    Chemistries   Recent Labs Lab 10/26/17 1955 10/27/17 0402 10/27/17 0640  NA 136 137  --   K 3.6 3.8  --   CL 102 106  --   CO2 25 27  --   GLUCOSE 139* 120*  --   BUN 14 14  --   CREATININE 0.61 0.57  --   CALCIUM 9.7 8.9  --   MG  --   --  2.0  AST 24  --   --   ALT 20  --   --   ALKPHOS 85  --   --   BILITOT 0.4  --   --     Microbiology Results   Recent Results (from the past 240 hour(s))  MRSA PCR Screening     Status: Abnormal   Collection  Time: 10/26/17 10:22 PM  Result Value Ref Range Status   MRSA by PCR POSITIVE (A) NEGATIVE Final    Comment:        The GeneXpert MRSA Assay (FDA approved for NASAL specimens only), is one component of a comprehensive MRSA colonization surveillance program. It is not intended to diagnose MRSA infection nor to guide or monitor treatment for MRSA infections. RESULT CALLED TO, READ BACK BY AND VERIFIED WITH: ANGELA BREHUN @ [id number] ON 10/27/2017 BY [facility]     RADIOLOGY:  No results found.   Management plans discussed with the patient, family and they are in agreement.  CODE STATUS:     Code Status Orders        Start     Ordered   10/26/17 2148  Full code  Continuous     10/26/17 2148    Code Status History    Date Active Date Inactive Code Status Order ID Comments User Context   This patient has a current code status but no historical code status.      TOTAL TIME TAKING CARE OF THIS PATIENT: *40* minutes.    Sharlene Mccluskey M.D on 10/28/2017 at 11:43 AM  Between 7am to 6pm - Pager - [phone] After 6pm go to [email or web address] - password [email or web address] [facility]  Office  [phone]  CC: Primary care physician; Olin Hauser, DO

## 2017-10-28 NOTE — Plan of Care (Signed)
Problem: Health Behavior/Discharge Planning: Goal: Ability to manage health-related needs will improve Outcome: Completed/Met Date Met: 10/28/17 Patient able to care for herself at home.  Has received education regarding healing and prevention of MI.

## 2017-10-28 NOTE — Progress Notes (Signed)
[facility] Cardiology [facility] Encounter Note  Patient: Angela Mccullough / Admit Date: 10/26/2017 / Date of Encounter: 10/28/2017, 8:58 AM   Subjective: Patient feels much better today. No evidence of chest pain or shortness of breath. Patient tolerating medication management and discontinuation of tobacco abuse well.  Review of Systems: Positive for: None egative for: Vision change, hearing change, syncope, dizziness, nausea, vomiting,diarrhea, bloody stool, stomach pain, cough, congestion, diaphoresis, urinary frequency, urinary pain,skin lesions, skin rashes Others previously listed  Objective: Telemetry:  normal sinus rhythm Physical Exam: Blood pressure 99/60, pulse 75, temperature 97.9 F (36.6 C), temperature source Oral, resp. rate 20, height 5\' 2"  (1.575 m), weight 84.8 kg (186 lb 15.2 oz), SpO2 96 %. Body mass index is 34.19 kg/m. General: Well developed, well nourished, in no acute distress. Head: Normocephalic, atraumatic, sclera non-icteric, no xanthomas, nares are without discharge. Neck: No apparent masses Lungs: Normal respirations with no wheezes, no rhonchi, no rales , no crackles   Heart: Regular rate and rhythm, normal S1 S2, no murmur, no rub, no gallop, PMI is normal size and placement, carotid upstroke normal without bruit, jugular venous pressure normal Abdomen: Soft, non-tender, non-distended with normoactive bowel sounds. No hepatosplenomegaly. Abdominal aorta is normal size without bruit Extremities: No edema, no clubbing, no cyanosis, no ulcers,  Peripheral: 2+ radial, 2+ femoral, 2+ dorsal pedal pulses Neuro: Alert and oriented. Moves all extremities spontaneously. Psych:  Responds to questions appropriately with a normal affect.   Intake/Output Summary (Last 24 hours) at 10/28/17 0858 Last data filed at 10/27/17 1700  Gross per 24 hour  Intake           689.66 ml  Output              800 ml  Net          -110.34 ml    Inpatient Medications:   . aspirin  81 mg Oral Daily  . atorvastatin  80 mg Oral q1800  . carvedilol  3.125 mg Oral BID WC  . Chlorhexidine Gluconate Cloth  6 each Topical Q0600  . citalopram  20 mg Oral Daily  . clopidogrel  75 mg Oral Q breakfast  . enoxaparin (LOVENOX) injection  40 mg Subcutaneous Q24H  . heart attack bouncing book   Does not apply Once  . loratadine  10 mg Oral Daily  . mupirocin ointment  1 application Nasal BID  . sodium chloride flush  3 mL Intravenous Q12H   Infusions:  . sodium chloride    . sodium chloride 250 mL (10/27/17 1200)    Labs:  Recent Labs  10/26/17 1955 10/27/17 0402 10/27/17 0640  NA 136 137  --   K 3.6 3.8  --   CL 102 106  --   CO2 25 27  --   GLUCOSE 139* 120*  --   BUN 14 14  --   CREATININE 0.61 0.57  --   CALCIUM 9.7 8.9  --   MG  --   --  2.0    Recent Labs  10/26/17 1955  AST 24  ALT 20  ALKPHOS 85  BILITOT 0.4  PROT 7.6  ALBUMIN 4.0    Recent Labs  10/26/17 1955 10/27/17 0402  WBC 16.2* 11.2*  NEUTROABS 13.0*  --   HGB 13.3 12.4  HCT 38.9 36.3  MCV 91.5 91.1  PLT 532* 512*    Recent Labs  10/26/17 1955 10/26/17 2200 10/27/17 0402 10/27/17 1338  TROPONINI <0.03 1.84* 22.76*  47.18*   Invalid input(s): POCBNP  Recent Labs  10/26/17 2200  HGBA1C 5.4     Weights: Filed Weights   10/26/17 2000 10/26/17 2133  Weight: 84.4 kg (186 lb) 84.8 kg (186 lb 15.2 oz)     Radiology/Studies:  No results found.   Assessment and Recommendation  51 y.o. female  with the known essential hypertension mixed hyperlipidemia and tobacco abuse with the acute ST elevation myocardial infarction for which the patient received an intervention and appropriate medication management now significantly improved I and  1. Continue high intensity cholesterol therapy with atorvastatin X 2. Dual antiplatelet therapy for 1 year with Plavix and aspirin 3. Of carvedilol and ACE inhibitor if able and/or the need for hypertension control and  myocardial infarction 4. Rehabilitation today with ambulation and follow for improvements of symptoms 5. Okay for discharge to home with follow-up next week  Signed, Serafina Royals M.D. FACC

## 2017-10-28 NOTE — Care Management (Signed)
Is not being discharged home on cost prohibitive antiplatelet medication.  No discharge needs identified by members of the care team  

## 2017-10-28 NOTE — Progress Notes (Signed)
[facility] [place]    MR#:  [id number]  DATE OF BIRTH:  [birth year]  SUBJECTIVE:   Seen in ICU no cp, family in the room REVIEW OF SYSTEMS:   Review of Systems  Constitutional: Negative for chills, fever and weight loss.  HENT: Negative for ear discharge, ear pain and nosebleeds.   Eyes: Negative for blurred vision, pain and discharge.  Respiratory: Negative for sputum production, shortness of breath, wheezing and stridor.   Cardiovascular: Negative for chest pain, palpitations, orthopnea and PND.  Gastrointestinal: Negative for abdominal pain, diarrhea, nausea and vomiting.  Genitourinary: Negative for frequency and urgency.  Musculoskeletal: Negative for back pain and joint pain.  Neurological: Negative for sensory change, speech change, focal weakness and weakness.  Psychiatric/Behavioral: Negative for depression and hallucinations. The patient is not nervous/anxious.    Tolerating Diet:yes Tolerating PT: not needed  DRUG ALLERGIES:   Allergies  Allergen Reactions  . Biaxin [Clarithromycin] Nausea And Vomiting    VITALS:  Blood pressure 99/60, pulse 75, temperature 97.9 F (36.6 C), temperature source Oral, resp. rate 20, height 5\' 2"  (1.575 m), weight 84.8 kg (186 lb 15.2 oz), SpO2 96 %.  PHYSICAL EXAMINATION:   Physical Exam  GENERAL:  51 y.o.-year-old patient lying in the bed with no acute distress.  EYES: Pupils equal, round, reactive to light and accommodation. No scleral icterus. Extraocular muscles intact.  HEENT: Head atraumatic, normocephalic. Oropharynx and nasopharynx clear.  NECK:  Supple, no jugular venous distention. No thyroid enlargement, no tenderness.  LUNGS: Normal breath sounds bilaterally, no wheezing, rales, rhonchi. No use of accessory muscles of respiration.  CARDIOVASCULAR: S1, S2 normal. No murmurs, rubs, or gallops.  ABDOMEN: Soft, nontender, nondistended. Bowel  sounds present. No organomegaly or mass.  EXTREMITIES: No cyanosis, clubbing or edema b/l.    NEUROLOGIC: Cranial nerves II through XII are intact. No focal Motor or sensory deficits b/l.   PSYCHIATRIC:  patient is alert and oriented x 3.  SKIN: No obvious rash, lesion, or ulcer.   LABORATORY PANEL:  CBC  Recent Labs Lab 10/27/17 0402  WBC 11.2*  HGB 12.4  HCT 36.3  PLT 512*    Chemistries   Recent Labs Lab 10/26/17 1955 10/27/17 0402 10/27/17 0640  NA 136 137  --   K 3.6 3.8  --   CL 102 106  --   CO2 25 27  --   GLUCOSE 139* 120*  --   BUN 14 14  --   CREATININE 0.61 0.57  --   CALCIUM 9.7 8.9  --   MG  --   --  2.0  AST 24  --   --   ALT 20  --   --   ALKPHOS 85  --   --   BILITOT 0.4  --   --    Cardiac Enzymes  Recent Labs Lab 10/27/17 1338  TROPONINI 47.18*   RADIOLOGY:  No results found. ASSESSMENT AND PLAN:  Angela Mccullough  is a 51 y.o. female who presents with chest pain and found to have STEMI in the ED.  She was taken to the Cath Lab where intervention was performed.  The patient was able to have one lesion open by PCI, but was not amenable to stenting.  2 other smaller lesions resolved during the catheterization procedure.  1. Acute STEMI (ST elevation myocardial infarction) ([place]) -status post PCI but no stent placement of obtuse marginal 3  with success   - Pt was on Aggrastat and Plavix -now on asa, coreg, lipitor, plavix, cardiac rehab -BP very soft--cannot give ACE -ECHO done results pending  2.  Anxiety with depression -on meds for this, monitor and treat as needed  3.  GERD -on meds for this at home, treat as needed  Transfer to 2A   Case discussed with Care Management/Social Worker. Management plans discussed with the patient, family and they are in agreement.  CODE STATUS: full  DVT Prophylaxis: lovenox  TOTAL TIME TAKING CARE OF THIS PATIENT: *30* minutes.  >50% time spent on counselling and coordination of  care  POSSIBLE D/C IN *1* DAYS, DEPENDING ON CLINICAL CONDITION.  Note: This dictation was prepared with Dragon dictation along with smaller phrase technology. Any transcriptional errors that result from this process are unintentional.  Angela Mccullough M.D on 10/28/2017 at 11:51 AM  Between 7am to 6pm - Pager - [phone]  After 6pm go to [email or web address] - password [email or web address] [facility]  Office  [phone]  CC: Primary care physician; Olin Hauser, DO

## 2017-11-03 DIAGNOSIS — I2121 ST elevation (STEMI) myocardial infarction involving left circumflex coronary artery: Secondary | ICD-10-CM | POA: Insufficient documentation

## 2017-11-03 DIAGNOSIS — I25119 Atherosclerotic heart disease of native coronary artery with unspecified angina pectoris: Secondary | ICD-10-CM | POA: Insufficient documentation

## 2017-11-08 ENCOUNTER — Ambulatory Visit: Payer: BC Managed Care – PPO

## 2017-11-25 ENCOUNTER — Encounter: Payer: BC Managed Care – PPO | Attending: Internal Medicine | Admitting: *Deleted

## 2017-11-25 VITALS — Ht 62.0 in | Wt 182.9 lb

## 2017-11-25 DIAGNOSIS — Z7982 Long term (current) use of aspirin: Secondary | ICD-10-CM | POA: Diagnosis not present

## 2017-11-25 DIAGNOSIS — I213 ST elevation (STEMI) myocardial infarction of unspecified site: Secondary | ICD-10-CM | POA: Diagnosis present

## 2017-11-25 DIAGNOSIS — F17210 Nicotine dependence, cigarettes, uncomplicated: Secondary | ICD-10-CM | POA: Insufficient documentation

## 2017-11-25 DIAGNOSIS — F419 Anxiety disorder, unspecified: Secondary | ICD-10-CM | POA: Diagnosis not present

## 2017-11-25 DIAGNOSIS — Z79899 Other long term (current) drug therapy: Secondary | ICD-10-CM | POA: Insufficient documentation

## 2017-11-25 DIAGNOSIS — F329 Major depressive disorder, single episode, unspecified: Secondary | ICD-10-CM | POA: Insufficient documentation

## 2017-11-25 DIAGNOSIS — Z7902 Long term (current) use of antithrombotics/antiplatelets: Secondary | ICD-10-CM | POA: Diagnosis not present

## 2017-11-25 NOTE — Progress Notes (Signed)
Daily Session Note  Patient Details  Name: Angela Mccullough MRN: [id number] Date of Birth: [birth year] Referring Provider:     Cardiac Rehab from 11/25/2017 in [facility] Cardiac and Pulmonary Rehab  Referring Provider  [name]      Encounter Date: 11/25/2017  Check In: Session Check In - 11/25/17 1203      Check-In   Location  [facility]-Cardiac & Pulmonary Rehab    Staff Present  Renita Papa, RN Vickki Hearing, BA, ACSM CEP, Exercise Physiologist    Supervising physician immediately available to respond to emergencies  See telemetry face sheet for immediately available ER MD    Medication changes reported      No    Fall or balance concerns reported     No    Tobacco Cessation  Use Decreased trying to cut back, down to 5 a day    Warm-up and Cool-down  Performed as group-led instruction    Resistance Training Performed  Yes    VAD Patient?  No      Pain Assessment   Currently in Pain?  No/denies        Exercise Prescription Changes - 11/25/17 1300      Response to Exercise   Blood Pressure (Admit)  126/70    Blood Pressure (Exercise)  122/66    Blood Pressure (Exit)  106/58    Heart Rate (Admit)  81 bpm    Heart Rate (Exercise)  104 bpm    Heart Rate (Exit)  84 bpm    Oxygen Saturation (Admit)  97 %    Rating of Perceived Exertion (Exercise)  12       Social History   Tobacco Use  Smoking Status Current Every Day Smoker  . Packs/day: 0.50  . Types: Cigarettes  Smokeless Tobacco Current User  Tobacco Comment   Cutting down to five cigs a day 11/25/17    Goals Met:  Proper associated with RPD/PD & O2 Sat Exercise tolerated well No report of cardiac concerns or symptoms Strength training completed today  Goals Unmet:  Not Applicable  Comments: Med Review Completed    Dr. Emily Filbert is Medical Director for [facility].

## 2017-11-25 NOTE — Patient Instructions (Signed)
Patient Instructions  Patient Details  Name: Angela Mccullough MRN: [id number] Date of Birth: 1966/04/29 Referring Provider:  Corey Skains, MD  Below are the personal goals you chose as well as exercise and nutrition goals. Our goal is to help you keep on track towards obtaining and maintaining your goals. We will be discussing your progress on these goals with you throughout the program.  Initial Exercise Prescription: Initial Exercise Prescription - 11/25/17 1400      Date of Initial Exercise RX and Referring Provider   Date  11/25/17    Referring Provider  [name]      Treadmill   MPH  2.5    Grade  1.5    Minutes  15    METs  3.5      Recumbant Bike   Level  5    RPM  60    Watts  45    Minutes  15    METs  3.6      NuStep   Level  4    SPM  80    Minutes  15    METs  3.5      Elliptical   Level  1    Speed  2.5    Minutes  15    METs  3.5      Prescription Details   Frequency (times per week)  3    Duration  Progress to 45 minutes of aerobic exercise without signs/symptoms of physical distress      Intensity   THRR 40-80% of Max Heartrate  108-149    Ratings of Perceived Exertion  11-13    Perceived Dyspnea  0-4      Resistance Training   Training Prescription  Yes    Weight  3 lb    Reps  10-15       Exercise Goals: Frequency: Be able to perform aerobic exercise three times per week working toward 3-5 days per week.  Intensity: Work with a perceived exertion of 11 (fairly light) - 15 (hard) as tolerated. Follow your new exercise prescription and watch for changes in prescription as you progress with the program. Changes will be reviewed with you when they are made.  Duration: You should be able to do 30 minutes of continuous aerobic exercise in addition to a 5 minute warm-up and a 5 minute cool-down routine.  Nutrition Goals: Your personal nutrition goals will be established when you do your nutrition analysis with the dietician.  The  following are nutrition guidelines to follow: Cholesterol < 200mg /day Sodium < 1500mg /day Fiber: Women over 50 yrs - 21 grams per day  Personal Goals: Personal Goals and Risk Factors at Admission - 11/25/17 1326      Core Components/Risk Factors/Patient Goals on Admission    Weight Management  Obesity;Weight Loss;Yes    Intervention  Weight Management: Develop a combined nutrition and exercise program designed to reach desired caloric intake, while maintaining appropriate intake of nutrient and fiber, sodium and fats, and appropriate energy expenditure required for the weight goal.;Obesity: Provide education and appropriate resources to help participant work on and attain dietary goals.;Weight Management/Obesity: Establish reasonable short term and long term weight goals.;Weight Management: Provide education and appropriate resources to help participant work on and attain dietary goals.    Admit Weight  182 lb 14.4 oz (83 kg)    Goal Weight: Short Term  178 lb (80.7 kg)    Goal Weight: Long Term  135 lb (61.2 kg)  Expected Outcomes  Short Term: Continue to assess and modify interventions until short term weight is achieved;Long Term: Adherence to nutrition and physical activity/exercise program aimed toward attainment of established weight goal;Weight Loss: Understanding of general recommendations for a balanced deficit meal plan, which promotes 1-2 lb weight loss per week and includes a negative energy balance of [phone] kcal/d;Understanding recommendations for meals to include 15-35% energy as protein, 25-35% energy from fat, 35-60% energy from carbohydrates, less than 200mg  of dietary cholesterol, 20-35 gm of total fiber daily;Understanding of distribution of calorie intake throughout the day with the consumption of 4-5 meals/snacks    Lipids  Yes    Intervention  Provide education and support for participant on nutrition & aerobic/resistive exercise along with prescribed medications to achieve  LDL 70mg , HDL >40mg .    Expected Outcomes  Short Term: Participant states understanding of desired cholesterol values and is compliant with medications prescribed. Participant is following exercise prescription and nutrition guidelines.;Long Term: Cholesterol controlled with medications as prescribed, with individualized exercise RX and with personalized nutrition plan. Value goals: LDL < 70mg , HDL > 40 mg.    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 Teach and support self-help strategies.;Refer participants experiencing significant psychosocial distress to appropriate mental health specialists for further evaluation and treatment. When possible, include family members and significant others in education/counseling sessions.    Expected Outcomes  Short Term: Participant demonstrates changes in health-related behavior, relaxation and other stress management skills, ability to obtain effective social support, and compliance with psychotropic medications if prescribed.;Long Term: Emotional wellbeing is indicated by absence of clinically significant psychosocial distress or social isolation.       Tobacco Use Initial Evaluation: Social History   Tobacco Use  Smoking Status Current Every Day Smoker  . Packs/day: 0.50  . Types: Cigarettes  Smokeless Tobacco Current User  Tobacco Comment   Cutting down to five cigs a day 11/25/17    Exercise Goals and Review: Exercise Goals    Row Name 11/25/17 1452             Exercise Goals   Increase Physical Activity  Yes       Intervention  Provide advice, education, support and counseling about physical activity/exercise needs.;Develop an individualized exercise prescription for aerobic and resistive training based on initial  evaluation findings, risk stratification, comorbidities and participant's personal goals.       Expected Outcomes  Achievement of increased cardiorespiratory fitness and enhanced flexibility, muscular endurance and strength shown through measurements of functional capacity and personal statement of participant.       Increase Strength and Stamina  Yes       Intervention  Provide advice, education, support and counseling about physical activity/exercise needs.;Develop an individualized exercise prescription for aerobic and resistive training based on initial evaluation findings, risk stratification, comorbidities and participant's personal goals.       Expected Outcomes  Achievement of increased cardiorespiratory fitness and enhanced flexibility, muscular endurance and strength shown through measurements of functional capacity and personal statement of participant.       Able to understand and use rate of perceived exertion (RPE) scale  Yes       Intervention  Provide education and explanation on how to use RPE scale  Expected Outcomes  Short Term: Able to use RPE daily in rehab to express subjective intensity level;Long Term:  Able to use RPE to guide intensity level when exercising independently       Able to understand and use Dyspnea scale  Yes       Intervention  Provide education and explanation on how to use Dyspnea scale       Expected Outcomes  Short Term: Able to use Dyspnea scale daily in rehab to express subjective sense of shortness of breath during exertion;Long Term: Able to use Dyspnea scale to guide intensity level when exercising independently       Knowledge and understanding of Target Heart Rate Range (THRR)  Yes       Intervention  Provide education and explanation of THRR including how the numbers were predicted and where they are located for reference       Expected Outcomes  Short Term: Able to state/look up THRR;Short Term: Able to use daily as guideline for intensity in  rehab;Long Term: Able to use THRR to govern intensity when exercising independently       Able to check pulse independently  Yes       Intervention  Provide education and demonstration on how to check pulse in carotid and radial arteries.;Review the importance of being able to check your own pulse for safety during independent exercise       Expected Outcomes  Short Term: Able to explain why pulse checking is important during independent exercise;Long Term: Able to check pulse independently and accurately       Understanding of Exercise Prescription  Yes       Intervention  Provide education, explanation, and written materials on patient's individual exercise prescription       Expected Outcomes  Short Term: Able to explain program exercise prescription;Long Term: Able to explain home exercise prescription to exercise independently          Copy of goals given to participant.

## 2017-11-25 NOTE — Progress Notes (Signed)
Cardiac Individual Treatment Plan  Patient Details  Name: Angela Mccullough MRN: [id number] Date of Birth: 1966/06/27 Referring Provider:     Cardiac Rehab from 11/25/2017 in [facility] Cardiac and Pulmonary Rehab  Referring Provider  [name]      Initial Encounter Date:    Cardiac Rehab from 11/25/2017 in [facility] Cardiac and Pulmonary Rehab  Date  11/25/17  Referring Provider  [name]      Visit Diagnosis: ST elevation myocardial infarction (STEMI), unspecified artery ([place])  Patient's Home Medications on Admission:  Current Outpatient Medications:  .  aspirin 81 MG chewable tablet, Chew 1 tablet (81 mg total) by mouth daily., Disp: 30 tablet, Rfl: 1 .  atorvastatin (LIPITOR) 80 MG tablet, Take 1 tablet (80 mg total) by mouth daily at 6 PM., Disp: 30 tablet, Rfl: 1 .  baclofen (LIORESAL) 10 MG tablet, Take 0.5-1 tablets (5-10 mg total) by mouth 3 (three) times daily as needed for muscle spasms., Disp: 30 each, Rfl: 1 .  carvedilol (COREG) 3.125 MG tablet, Take 1 tablet (3.125 mg total) by mouth 2 (two) times daily with a meal., Disp: 60 tablet, Rfl: 1 .  cetirizine (ZYRTEC) 10 MG tablet, Take 10 mg by mouth daily., Disp: , Rfl:  .  citalopram (CELEXA) 40 MG tablet, , Disp: , Rfl:  .  clopidogrel (PLAVIX) 75 MG tablet, Take 1 tablet (75 mg total) by mouth daily with breakfast., Disp: 30 tablet, Rfl: 1 .  albuterol (PROVENTIL HFA;VENTOLIN HFA) 108 (90 BASE) MCG/ACT inhaler, Inhale 2 puffs into the lungs every 6 (six) hours as needed for wheezing or shortness of breath. (Patient not taking: Reported on 11/25/2017), Disp: 1 Inhaler, Rfl: 12 .  Cholecalciferol 2000 units CAPS, Take 1 capsule (2,000 Units total) by mouth daily. (Patient not taking: Reported on 11/25/2017), Disp: 30 each, Rfl:  .  citalopram (CELEXA) 20 MG tablet, Take 20 mg by mouth daily., Disp: , Rfl:  .  diphenhydrAMINE (BENADRYL) 25 MG tablet, Take 25 mg by mouth every 4 (four) hours as needed., Disp: , Rfl:  .  fluticasone  (FLOVENT HFA) 110 MCG/ACT inhaler, Inhale 1 puff into the lungs 2 (two) times daily. (Patient not taking: Reported on 11/25/2017), Disp: 1 Inhaler, Rfl: 12 .  mometasone (NASONEX) 50 MCG/ACT nasal spray, Place 2 sprays into the nose daily. (Patient not taking: Reported on 11/25/2017), Disp: 17 g, Rfl: 12 .  Psyllium 28.3 % POWD, Take 500 mg by mouth daily. (Patient not taking: Reported on 10/28/2017), Disp: 283 g, Rfl:   Past Medical History: Past Medical History:  Diagnosis Date  . Anxiety and depression   . ASD (atrial septal defect), common atrium (single atrium)   . Heart murmur   . Heartburn   . Kidney stone     Tobacco Use: Social History   Tobacco Use  Smoking Status Current Every Day Smoker  . Packs/day: 0.50  . Types: Cigarettes  Smokeless Tobacco Current User  Tobacco Comment   Cutting down to five cigs a day 11/25/17    Labs: Recent Review Flowsheet Data    Labs for ITP Cardiac and Pulmonary Rehab Latest Ref Rng & Units 07/09/2009 11/26/2015 10/26/2017   Cholestrol 0 - 200 mg/dL - 214(H) 213(H)   LDLCALC 0 - 99 mg/dL - 128(H) 130(H)   LDLDIRECT mg/dL 130.4 - -   HDL >40 mg/dL - 63 49   Trlycerides <150 mg/dL - 115 172(H)   Hemoglobin A1c 4.8 - 5.6 % - - 5.4  Exercise Target Goals: Date: 11/25/17  Exercise Program Goal: Individual exercise prescription set with THRR, safety & activity barriers. Participant demonstrates ability to understand and report RPE using BORG scale, to self-measure pulse accurately, and to acknowledge the importance of the exercise prescription.  Exercise Prescription Goal: Starting with aerobic activity 30 plus minutes a day, 3 days per week for initial exercise prescription. Provide home exercise prescription and guidelines that participant acknowledges understanding prior to discharge.  Activity Barriers & Risk Stratification: Activity Barriers & Cardiac Risk Stratification - 11/25/17 1355      Activity Barriers & Cardiac Risk  Stratification   Activity Barriers  None    Cardiac Risk Stratification  Moderate       6 Minute Walk: 6 Minute Walk    Row Name 11/25/17 1452         6 Minute Walk   Distance  1350 feet     Walk Time  6 minutes     # of Rest Breaks  0     MPH  2.55     METS  3.68     RPE  12     Perceived Dyspnea   1     VO2 Peak  12.89     Symptoms  No     Resting HR  68 bpm     Resting BP  126/70     Resting Oxygen Saturation   97 %     Exercise Oxygen Saturation  during 6 min walk  98 %     Max Ex. HR  104 bpm     Max Ex. BP  122/60     2 Minute Post BP  106/58        Oxygen Initial Assessment:   Oxygen Re-Evaluation:   Oxygen Discharge (Final Oxygen Re-Evaluation):   Initial Exercise Prescription: Initial Exercise Prescription - 11/25/17 1400      Date of Initial Exercise RX and Referring Provider   Date  11/25/17    Referring Provider  [name]      Treadmill   MPH  2.5    Grade  1.5    Minutes  15    METs  3.5      Recumbant Bike   Level  5    RPM  60    Watts  45    Minutes  15    METs  3.6      NuStep   Level  4    SPM  80    Minutes  15    METs  3.5      Elliptical   Level  1    Speed  2.5    Minutes  15    METs  3.5      Prescription Details   Frequency (times per week)  3    Duration  Progress to 45 minutes of aerobic exercise without signs/symptoms of physical distress      Intensity   THRR 40-80% of Max Heartrate  108-149    Ratings of Perceived Exertion  11-13    Perceived Dyspnea  0-4      Resistance Training   Training Prescription  Yes    Weight  3 lb    Reps  10-15       Perform Capillary Blood Glucose checks as needed.  Exercise Prescription Changes: Exercise Prescription Changes    Row Name 11/25/17 1300             Response to  Exercise   Blood Pressure (Admit)  126/70       Blood Pressure (Exercise)  122/66       Blood Pressure (Exit)  106/58       Heart Rate (Admit)  81 bpm       Heart Rate (Exercise)  104  bpm       Heart Rate (Exit)  84 bpm       Oxygen Saturation (Admit)  97 %       Rating of Perceived Exertion (Exercise)  12          Exercise Comments:   Exercise Goals and Review: Exercise Goals    Row Name 11/25/17 1452             Exercise Goals   Increase Physical Activity  Yes       Intervention  Provide advice, education, support and counseling about physical activity/exercise needs.;Develop an individualized exercise prescription for aerobic and resistive training based on initial evaluation findings, risk stratification, comorbidities and participant's personal goals.       Expected Outcomes  Achievement of increased cardiorespiratory fitness and enhanced flexibility, muscular endurance and strength shown through measurements of functional capacity and personal statement of participant.       Increase Strength and Stamina  Yes       Intervention  Provide advice, education, support and counseling about physical activity/exercise needs.;Develop an individualized exercise prescription for aerobic and resistive training based on initial evaluation findings, risk stratification, comorbidities and participant's personal goals.       Expected Outcomes  Achievement of increased cardiorespiratory fitness and enhanced flexibility, muscular endurance and strength shown through measurements of functional capacity and personal statement of participant.       Able to understand and use rate of perceived exertion (RPE) scale  Yes       Intervention  Provide education and explanation on how to use RPE scale       Expected Outcomes  Short Term: Able to use RPE daily in rehab to express subjective intensity level;Long Term:  Able to use RPE to guide intensity level when exercising independently       Able to understand and use Dyspnea scale  Yes       Intervention  Provide education and explanation on how to use Dyspnea scale       Expected Outcomes  Short Term: Able to use Dyspnea scale daily in  rehab to express subjective sense of shortness of breath during exertion;Long Term: Able to use Dyspnea scale to guide intensity level when exercising independently       Knowledge and understanding of Target Heart Rate Range (THRR)  Yes       Intervention  Provide education and explanation of THRR including how the numbers were predicted and where they are located for reference       Expected Outcomes  Short Term: Able to state/look up THRR;Short Term: Able to use daily as guideline for intensity in rehab;Long Term: Able to use THRR to govern intensity when exercising independently       Able to check pulse independently  Yes       Intervention  Provide education and demonstration on how to check pulse in carotid and radial arteries.;Review the importance of being able to check your own pulse for safety during independent exercise       Expected Outcomes  Short Term: Able to explain why pulse checking is important during independent exercise;Long Term: Able to check  pulse independently and accurately       Understanding of Exercise Prescription  Yes       Intervention  Provide education, explanation, and written materials on patient's individual exercise prescription       Expected Outcomes  Short Term: Able to explain program exercise prescription;Long Term: Able to explain home exercise prescription to exercise independently          Exercise Goals Re-Evaluation :   Discharge Exercise Prescription (Final Exercise Prescription Changes): Exercise Prescription Changes - 11/25/17 1300      Response to Exercise   Blood Pressure (Admit)  126/70    Blood Pressure (Exercise)  122/66    Blood Pressure (Exit)  106/58    Heart Rate (Admit)  81 bpm    Heart Rate (Exercise)  104 bpm    Heart Rate (Exit)  84 bpm    Oxygen Saturation (Admit)  97 %    Rating of Perceived Exertion (Exercise)  12       Nutrition:  Target Goals: Understanding of nutrition guidelines, daily intake of sodium 1500mg ,  cholesterol 200mg , calories 30% from fat and 7% or less from saturated fats, daily to have 5 or more servings of fruits and vegetables.  Biometrics: Pre Biometrics - 11/25/17 1451      Pre Biometrics   Height  5\' 2"  (1.575 m)    Weight  182 lb 14.4 oz (83 kg)    Waist Circumference  38 inches    Hip Circumference  46 inches    Waist to Hip Ratio  0.83 %    BMI (Calculated)  33.44    Single Leg Stand  5.19 seconds        Nutrition Therapy Plan and Nutrition Goals:   Nutrition Discharge: Rate Your Plate Scores: Nutrition Assessments - 11/25/17 1331      MEDFICTS Scores   Pre Score  65       Nutrition Goals Re-Evaluation:   Nutrition Goals Discharge (Final Nutrition Goals Re-Evaluation):   Psychosocial: Target Goals: Acknowledge presence or absence of significant depression and/or stress, maximize coping skills, provide positive support system. Participant is able to verbalize types and ability to use techniques and skills needed for reducing stress and depression.   Initial Review & Psychosocial Screening: Initial Psych Review & Screening - 11/25/17 1344      Initial Review   Current issues with  Current Depression;Current Anxiety/Panic;Current Stress Concerns    Source of Stress Concerns  Financial    Comments  She is returning to work from being on FMLA due to her STEMI and taking care of her dad who passed away. He was moved to hospice the same day she had her STEMI. She is playing "catch up" on her bills now that she has returned to work. She has struggled with depression in the past and currently, she is getting her meds adjusted.       Family Dynamics   Good Support System?  Yes Long time boyfriend and children      Screening Interventions   Interventions  Yes;Encouraged to exercise;Program counselor consult    Expected Outcomes  Short Term goal: Utilizing psychosocial counselor, staff and physician to assist with identification of specific Stressors or current  issues interfering with healing process. Setting desired goal for each stressor or current issue identified.;Long Term Goal: Stressors or current issues are controlled or eliminated.;Short Term goal: Identification and review with participant of any Quality of Life or Depression concerns found by scoring the  questionnaire.;Long Term goal: The participant improves quality of Life and PHQ9 Scores as seen by post scores and/or verbalization of changes       Quality of Life Scores:  Quality of Life - 11/25/17 1350      Quality of Life Scores   Health/Function Pre  20.53 %    Socioeconomic Pre  20.5 %    Psych/Spiritual Pre  20.29 %    Family Pre  20.6 %    GLOBAL Pre  20.49 %       PHQ-9: Recent Review Flowsheet Data    Depression screen [facility] 2/9 11/25/2017 01/20/2016 12/12/2015 10/14/2015   Decreased Interest 2 0 0 0   Down, Depressed, Hopeless 3 0 0 0   PHQ - 2 Score 5 0 0 0   Altered sleeping 1 - - -   Tired, decreased energy 3 - - -   Change in appetite 3 - - -   Feeling bad or failure about yourself  0 - - -   Trouble concentrating 1 - - -   Moving slowly or fidgety/restless 0 - - -   Suicidal thoughts 0 - - -   PHQ-9 Score 13 - - -   Difficult doing work/chores Somewhat difficult - - -     Interpretation of Total Score  Total Score Depression Severity:  1-4 = Minimal depression, 5-9 = Mild depression, 10-14 = Moderate depression, 15-19 = Moderately severe depression, 20-27 = Severe depression   Psychosocial Evaluation and Intervention:   Psychosocial Re-Evaluation:   Psychosocial Discharge (Final Psychosocial Re-Evaluation):   Vocational Rehabilitation: Provide vocational rehab assistance to qualifying candidates.   Vocational Rehab Evaluation & Intervention:   Education: Education Goals: Education classes will be provided on a variety of topics geared toward better understanding of heart health and risk factor modification. Participant will state  understanding/return demonstration of topics presented as noted by education test scores.  Learning Barriers/Preferences: Learning Barriers/Preferences - 11/25/17 1352      Learning Barriers/Preferences   Learning Barriers  None    Learning Preferences  Group Instruction;Written Material       Education Topics: General Nutrition Guidelines/Fats and Fiber: -Group instruction provided by verbal, written material, models and posters to present the general guidelines for heart healthy nutrition. Gives an explanation and review of dietary fats and fiber.   Controlling Sodium/Reading Food Labels: -Group verbal and written material supporting the discussion of sodium use in heart healthy nutrition. Review and explanation with models, verbal and written materials for utilization of the food label.   Exercise Physiology & Risk Factors: - Group verbal and written instruction with models to review the exercise physiology of the cardiovascular system and associated critical values. Details cardiovascular disease risk factors and the goals associated with each risk factor.   Aerobic Exercise & Resistance Training: - Gives group verbal and written discussion on the health impact of inactivity. On the components of aerobic and resistive training programs and the benefits of this training and how to safely progress through these programs.   Flexibility, Balance, General Exercise Guidelines: - Provides group verbal and written instruction on the benefits of flexibility and balance training programs. Provides general exercise guidelines with specific guidelines to those with heart or lung disease. Demonstration and skill practice provided.   Stress Management: - Provides group verbal and written instruction about the health risks of elevated stress, cause of high stress, and healthy ways to reduce stress.   Depression: - Provides group verbal and written  instruction on the correlation between  heart/lung disease and depressed mood, treatment options, and the stigmas associated with seeking treatment.   Anatomy & Physiology of the Heart: - Group verbal and written instruction and models provide basic cardiac anatomy and physiology, with the coronary electrical and arterial systems. Review of: AMI, Angina, Valve disease, Heart Failure, Cardiac Arrhythmia, Pacemakers, and the ICD.   Cardiac Procedures: - Group verbal and written instruction to review commonly prescribed medications for heart disease. Reviews the medication, class of the drug, and side effects. Includes the steps to properly store meds and maintain the prescription regimen. (beta blockers and nitrates)   Cardiac Medications I: - Group verbal and written instruction to review commonly prescribed medications for heart disease. Reviews the medication, class of the drug, and side effects. Includes the steps to properly store meds and maintain the prescription regimen.   Cardiac Medications II: -Group verbal and written instruction to review commonly prescribed medications for heart disease. Reviews the medication, class of the drug, and side effects. (all other drug classes)    Go Sex-Intimacy & Heart Disease, Get SMART - Goal Setting: - Group verbal and written instruction through game format to discuss heart disease and the return to sexual intimacy. Provides group verbal and written material to discuss and apply goal setting through the application of the S.M.A.R.T. Method.   Other Matters of the Heart: - Provides group verbal, written materials and models to describe Heart Failure, Angina, Valve Disease, Peripheral Artery Disease, and Diabetes in the realm of heart disease. Includes description of the disease process and treatment options available to the cardiac patient.   Exercise & Equipment Safety: - Individual verbal instruction and demonstration of equipment use and safety with use of the equipment.    Cardiac Rehab from 11/25/2017 in [facility] Cardiac and Pulmonary Rehab  Date  11/25/17  Educator  mc  Instruction Review Code  1- Verbalizes Understanding      Infection Prevention: - Provides verbal and written material to individual with discussion of infection control including proper hand washing and proper equipment cleaning during exercise session.   Cardiac Rehab from 11/25/2017 in [facility] Cardiac and Pulmonary Rehab  Date  11/25/17  Educator  mc  Instruction Review Code  1- Verbalizes Understanding      Falls Prevention: - Provides verbal and written material to individual with discussion of falls prevention and safety.   Cardiac Rehab from 11/25/2017 in [facility] Cardiac and Pulmonary Rehab  Date  11/25/17  Educator  mc  Instruction Review Code  1- Verbalizes Understanding      Diabetes: - Individual verbal and written instruction to review signs/symptoms of diabetes, desired ranges of glucose level fasting, after meals and with exercise. Acknowledge that pre and post exercise glucose checks will be done for 3 sessions at entry of program.   Other: -Provides group and verbal instruction on various topics (see comments)    Knowledge Questionnaire Score: Knowledge Questionnaire Score - 11/25/17 1352      Knowledge Questionnaire Score   Pre Score  25/28 correct answers reviewed with Laddie       Core Components/Risk Factors/Patient Goals at Admission: Personal Goals and Risk Factors at Admission - 11/25/17 1326      Core Components/Risk Factors/Patient Goals on Admission    Weight Management  Obesity;Weight Loss;Yes    Intervention  Weight Management: Develop a combined nutrition and exercise program designed to reach desired caloric intake, while maintaining appropriate intake of nutrient and fiber, sodium and fats,  and appropriate energy expenditure required for the weight goal.;Obesity: Provide education and appropriate resources to help participant work on and attain  dietary goals.;Weight Management/Obesity: Establish reasonable short term and long term weight goals.;Weight Management: Provide education and appropriate resources to help participant work on and attain dietary goals.    Admit Weight  182 lb 14.4 oz (83 kg)    Goal Weight: Short Term  178 lb (80.7 kg)    Goal Weight: Long Term  135 lb (61.2 kg)    Expected Outcomes  Short Term: Continue to assess and modify interventions until short term weight is achieved;Long Term: Adherence to nutrition and physical activity/exercise program aimed toward attainment of established weight goal;Weight Loss: Understanding of general recommendations for a balanced deficit meal plan, which promotes 1-2 lb weight loss per week and includes a negative energy balance of [phone] kcal/d;Understanding recommendations for meals to include 15-35% energy as protein, 25-35% energy from fat, 35-60% energy from carbohydrates, less than 200mg  of dietary cholesterol, 20-35 gm of total fiber daily;Understanding of distribution of calorie intake throughout the day with the consumption of 4-5 meals/snacks    Lipids  Yes    Intervention  Provide education and support for participant on nutrition & aerobic/resistive exercise along with prescribed medications to achieve LDL 70mg , HDL >40mg .    Expected Outcomes  Short Term: Participant states understanding of desired cholesterol values and is compliant with medications prescribed. Participant is following exercise prescription and nutrition guidelines.;Long Term: Cholesterol controlled with medications as prescribed, with individualized exercise RX and with personalized nutrition plan. Value goals: LDL < 70mg , HDL > 40 mg.    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  Teach and support self-help strategies.;Refer participants experiencing significant psychosocial distress to appropriate mental health specialists for further evaluation and treatment. When possible, include family members and significant others in education/counseling sessions.    Expected Outcomes  Short Term: Participant demonstrates changes in health-related behavior, relaxation and other stress management skills, ability to obtain effective social support, and compliance with psychotropic medications if prescribed.;Long Term: Emotional wellbeing is indicated by absence of clinically significant psychosocial distress or social isolation.       Core Components/Risk Factors/Patient Goals Review:    Core Components/Risk Factors/Patient Goals at Discharge (Final Review):    ITP Comments: ITP Comments    Row Name 11/25/17 1322           ITP Comments  Med review completed. Initial ITP created. Diagnosis can be found in [facility] 10/26/17          Comments: Initial ITP

## 2017-12-01 ENCOUNTER — Encounter: Payer: BC Managed Care – PPO | Attending: Internal Medicine | Admitting: *Deleted

## 2017-12-01 ENCOUNTER — Encounter: Payer: Self-pay | Admitting: *Deleted

## 2017-12-01 DIAGNOSIS — Z7982 Long term (current) use of aspirin: Secondary | ICD-10-CM | POA: Diagnosis not present

## 2017-12-01 DIAGNOSIS — I213 ST elevation (STEMI) myocardial infarction of unspecified site: Secondary | ICD-10-CM | POA: Insufficient documentation

## 2017-12-01 DIAGNOSIS — F17210 Nicotine dependence, cigarettes, uncomplicated: Secondary | ICD-10-CM | POA: Insufficient documentation

## 2017-12-01 DIAGNOSIS — Z79899 Other long term (current) drug therapy: Secondary | ICD-10-CM | POA: Insufficient documentation

## 2017-12-01 DIAGNOSIS — F419 Anxiety disorder, unspecified: Secondary | ICD-10-CM | POA: Insufficient documentation

## 2017-12-01 DIAGNOSIS — F329 Major depressive disorder, single episode, unspecified: Secondary | ICD-10-CM | POA: Diagnosis not present

## 2017-12-01 DIAGNOSIS — Z7902 Long term (current) use of antithrombotics/antiplatelets: Secondary | ICD-10-CM | POA: Diagnosis not present

## 2017-12-01 NOTE — Progress Notes (Signed)
Daily Session Note  Patient Details  Name: Angela Mccullough MRN: [id number] Date of Birth: 1966-07-06 Referring Provider:     Cardiac Rehab from 11/25/2017 in [facility] Cardiac [facility] Pul[facility]  Referring Provider  [name]      Encounter Date: 12/01/2017  Check In: Session Check In - 12/01/17 1620      Check-In   Location  [facility]-Cardiac [facility] Pulmonary Rehab    Staff Present  Renita Papa, RN Vickki Hearing, BA, ACSM CEP, Exercise Physiologist;Carroll Enterkin, RN, BSN    Supervising physician immediately available to respond to emergencies  See telemetry face sheet for immediately available ER MD    Medication changes reported      No    Fall or balance concerns reported     No    Tobacco Cessation  Use Increase up to 8 cigs a day     Warm-up and Cool-down  Performed on first and last piece of equipment    Resistance Training Performed  Yes    VAD Patient?  No      Pain Assessment   Currently in Pain?  No/denies          Social History   Tobacco Use  Smoking Status Current Every Day Smoker  . Packs/day: 0.50  . Types: Cigarettes  Smokeless Tobacco Current User  Tobacco Comment   Cutting down to five cigs a day 11/25/17    Goals Met:  Independence with exercise equipment Exercise tolerated well No report of cardiac concerns or symptoms Strength training completed today  Goals Unmet:  Not Applicable  Comments: First full day of exercise!  Patient was oriented to gym and equipment including functions, settings, policies, and procedures.  Patient's individual exercise prescription and treatment plan were reviewed.  All starting workloads were established based on the results of the 6 minute walk test done at initial orientation visit.  The plan for exercise progression was also introduced and progression will be customized based on patient's performance and goals.    Dr. Emily Filbert is Medical Director for [facility]  Pulmonary Rehabilitation.

## 2017-12-01 NOTE — Progress Notes (Signed)
Cardiac Individual Treatment Plan  Patient Details  Name: Angela Mccullough MRN: [id number] Date of Birth: 05-07-1966 Referring Provider:     Cardiac Rehab from 11/25/2017 in [facility] Cardiac and Pulmonary Rehab  Referring Provider  [name]      Initial Encounter Date:    Cardiac Rehab from 11/25/2017 in [facility] Cardiac and Pulmonary Rehab  Date  11/25/17  Referring Provider  [name]      Visit Diagnosis: ST elevation myocardial infarction (STEMI), unspecified artery ([place])  Patient's Home Medications on Admission:  Current Outpatient Medications:  .  albuterol (PROVENTIL HFA;VENTOLIN HFA) 108 (90 BASE) MCG/ACT inhaler, Inhale 2 puffs into the lungs every 6 (six) hours as needed for wheezing or shortness of breath. (Patient not taking: Reported on 11/25/2017), Disp: 1 Inhaler, Rfl: 12 .  aspirin 81 MG chewable tablet, Chew 1 tablet (81 mg total) by mouth daily., Disp: 30 tablet, Rfl: 1 .  atorvastatin (LIPITOR) 80 MG tablet, Take 1 tablet (80 mg total) by mouth daily at 6 PM., Disp: 30 tablet, Rfl: 1 .  baclofen (LIORESAL) 10 MG tablet, Take 0.5-1 tablets (5-10 mg total) by mouth 3 (three) times daily as needed for muscle spasms., Disp: 30 each, Rfl: 1 .  carvedilol (COREG) 3.125 MG tablet, Take 1 tablet (3.125 mg total) by mouth 2 (two) times daily with a meal., Disp: 60 tablet, Rfl: 1 .  cetirizine (ZYRTEC) 10 MG tablet, Take 10 mg by mouth daily., Disp: , Rfl:  .  Cholecalciferol 2000 units CAPS, Take 1 capsule (2,000 Units total) by mouth daily. (Patient not taking: Reported on 11/25/2017), Disp: 30 each, Rfl:  .  citalopram (CELEXA) 20 MG tablet, Take 20 mg by mouth daily., Disp: , Rfl:  .  citalopram (CELEXA) 40 MG tablet, , Disp: , Rfl:  .  clopidogrel (PLAVIX) 75 MG tablet, Take 1 tablet (75 mg total) by mouth daily with breakfast., Disp: 30 tablet, Rfl: 1 .  diphenhydrAMINE (BENADRYL) 25 MG tablet, Take 25 mg by mouth every 4 (four) hours as needed., Disp: , Rfl:  .  fluticasone  (FLOVENT HFA) 110 MCG/ACT inhaler, Inhale 1 puff into the lungs 2 (two) times daily. (Patient not taking: Reported on 11/25/2017), Disp: 1 Inhaler, Rfl: 12 .  mometasone (NASONEX) 50 MCG/ACT nasal spray, Place 2 sprays into the nose daily. (Patient not taking: Reported on 11/25/2017), Disp: 17 g, Rfl: 12 .  Psyllium 28.3 % POWD, Take 500 mg by mouth daily. (Patient not taking: Reported on 10/28/2017), Disp: 283 g, Rfl:   Past Medical History: Past Medical History:  Diagnosis Date  . Anxiety and depression   . ASD (atrial septal defect), common atrium (single atrium)   . Heart murmur   . Heartburn   . Kidney stone     Tobacco Use: Social History   Tobacco Use  Smoking Status Current Every Day Smoker  . Packs/day: 0.50  . Types: Cigarettes  Smokeless Tobacco Current User  Tobacco Comment   Cutting down to five cigs a day 11/25/17    Labs: Recent Review Flowsheet Data    Labs for ITP Cardiac and Pulmonary Rehab Latest Ref Rng & Units 07/09/2009 11/26/2015 10/26/2017   Cholestrol 0 - 200 mg/dL - 214(H) 213(H)   LDLCALC 0 - 99 mg/dL - 128(H) 130(H)   LDLDIRECT mg/dL 130.4 - -   HDL >40 mg/dL - 63 49   Trlycerides <150 mg/dL - 115 172(H)   Hemoglobin A1c 4.8 - 5.6 % - - 5.4  Exercise Target Goals:    Exercise Program Goal: Individual exercise prescription set with THRR, safety & activity barriers. Participant demonstrates ability to understand and report RPE using BORG scale, to self-measure pulse accurately, and to acknowledge the importance of the exercise prescription.  Exercise Prescription Goal: Starting with aerobic activity 30 plus minutes a day, 3 days per week for initial exercise prescription. Provide home exercise prescription and guidelines that participant acknowledges understanding prior to discharge.  Activity Barriers & Risk Stratification: Activity Barriers & Cardiac Risk Stratification - 11/25/17 1355      Activity Barriers & Cardiac Risk  Stratification   Activity Barriers  None    Cardiac Risk Stratification  Moderate       6 Minute Walk: 6 Minute Walk    Row Name 11/25/17 1452         6 Minute Walk   Distance  1350 feet     Walk Time  6 minutes     # of Rest Breaks  0     MPH  2.55     METS  3.68     RPE  12     Perceived Dyspnea   1     VO2 Peak  12.89     Symptoms  No     Resting HR  68 bpm     Resting BP  126/70     Resting Oxygen Saturation   97 %     Exercise Oxygen Saturation  during 6 min walk  98 %     Max Ex. HR  104 bpm     Max Ex. BP  122/60     2 Minute Post BP  106/58        Oxygen Initial Assessment:   Oxygen Re-Evaluation:   Oxygen Discharge (Final Oxygen Re-Evaluation):   Initial Exercise Prescription: Initial Exercise Prescription - 11/25/17 1400      Date of Initial Exercise RX and Referring Provider   Date  11/25/17    Referring Provider  [name]      Treadmill   MPH  2.5    Grade  1.5    Minutes  15    METs  3.5      Recumbant Bike   Level  5    RPM  60    Watts  45    Minutes  15    METs  3.6      NuStep   Level  4    SPM  80    Minutes  15    METs  3.5      Elliptical   Level  1    Speed  2.5    Minutes  15    METs  3.5      Prescription Details   Frequency (times per week)  3    Duration  Progress to 45 minutes of aerobic exercise without signs/symptoms of physical distress      Intensity   THRR 40-80% of Max Heartrate  108-149    Ratings of Perceived Exertion  11-13    Perceived Dyspnea  0-4      Resistance Training   Training Prescription  Yes    Weight  3 lb    Reps  10-15       Perform Capillary Blood Glucose checks as needed.  Exercise Prescription Changes: Exercise Prescription Changes    Row Name 11/25/17 1300             Response to  Exercise   Blood Pressure (Admit)  126/70       Blood Pressure (Exercise)  122/66       Blood Pressure (Exit)  106/58       Heart Rate (Admit)  81 bpm       Heart Rate (Exercise)  104  bpm       Heart Rate (Exit)  84 bpm       Oxygen Saturation (Admit)  97 %       Rating of Perceived Exertion (Exercise)  12          Exercise Comments:   Exercise Goals and Review: Exercise Goals    Row Name 11/25/17 1452             Exercise Goals   Increase Physical Activity  Yes       Intervention  Provide advice, education, support and counseling about physical activity/exercise needs.;Develop an individualized exercise prescription for aerobic and resistive training based on initial evaluation findings, risk stratification, comorbidities and participant's personal goals.       Expected Outcomes  Achievement of increased cardiorespiratory fitness and enhanced flexibility, muscular endurance and strength shown through measurements of functional capacity and personal statement of participant.       Increase Strength and Stamina  Yes       Intervention  Provide advice, education, support and counseling about physical activity/exercise needs.;Develop an individualized exercise prescription for aerobic and resistive training based on initial evaluation findings, risk stratification, comorbidities and participant's personal goals.       Expected Outcomes  Achievement of increased cardiorespiratory fitness and enhanced flexibility, muscular endurance and strength shown through measurements of functional capacity and personal statement of participant.       Able to understand and use rate of perceived exertion (RPE) scale  Yes       Intervention  Provide education and explanation on how to use RPE scale       Expected Outcomes  Short Term: Able to use RPE daily in rehab to express subjective intensity level;Long Term:  Able to use RPE to guide intensity level when exercising independently       Able to understand and use Dyspnea scale  Yes       Intervention  Provide education and explanation on how to use Dyspnea scale       Expected Outcomes  Short Term: Able to use Dyspnea scale daily in  rehab to express subjective sense of shortness of breath during exertion;Long Term: Able to use Dyspnea scale to guide intensity level when exercising independently       Knowledge and understanding of Target Heart Rate Range (THRR)  Yes       Intervention  Provide education and explanation of THRR including how the numbers were predicted and where they are located for reference       Expected Outcomes  Short Term: Able to state/look up THRR;Short Term: Able to use daily as guideline for intensity in rehab;Long Term: Able to use THRR to govern intensity when exercising independently       Able to check pulse independently  Yes       Intervention  Provide education and demonstration on how to check pulse in carotid and radial arteries.;Review the importance of being able to check your own pulse for safety during independent exercise       Expected Outcomes  Short Term: Able to explain why pulse checking is important during independent exercise;Long Term: Able to check  pulse independently and accurately       Understanding of Exercise Prescription  Yes       Intervention  Provide education, explanation, and written materials on patient's individual exercise prescription       Expected Outcomes  Short Term: Able to explain program exercise prescription;Long Term: Able to explain home exercise prescription to exercise independently          Exercise Goals Re-Evaluation :   Discharge Exercise Prescription (Final Exercise Prescription Changes): Exercise Prescription Changes - 11/25/17 1300      Response to Exercise   Blood Pressure (Admit)  126/70    Blood Pressure (Exercise)  122/66    Blood Pressure (Exit)  106/58    Heart Rate (Admit)  81 bpm    Heart Rate (Exercise)  104 bpm    Heart Rate (Exit)  84 bpm    Oxygen Saturation (Admit)  97 %    Rating of Perceived Exertion (Exercise)  12       Nutrition:  Target Goals: Understanding of nutrition guidelines, daily intake of sodium 1500mg ,  cholesterol 200mg , calories 30% from fat and 7% or less from saturated fats, daily to have 5 or more servings of fruits and vegetables.  Biometrics: Pre Biometrics - 11/25/17 1451      Pre Biometrics   Height  5\' 2"  (1.575 m)    Weight  182 lb 14.4 oz (83 kg)    Waist Circumference  38 inches    Hip Circumference  46 inches    Waist to Hip Ratio  0.83 %    BMI (Calculated)  33.44    Single Leg Stand  5.19 seconds        Nutrition Therapy Plan and Nutrition Goals:   Nutrition Discharge: Rate Your Plate Scores: Nutrition Assessments - 11/25/17 1331      MEDFICTS Scores   Pre Score  65       Nutrition Goals Re-Evaluation:   Nutrition Goals Discharge (Final Nutrition Goals Re-Evaluation):   Psychosocial: Target Goals: Acknowledge presence or absence of significant depression and/or stress, maximize coping skills, provide positive support system. Participant is able to verbalize types and ability to use techniques and skills needed for reducing stress and depression.   Initial Review & Psychosocial Screening: Initial Psych Review & Screening - 11/25/17 1344      Initial Review   Current issues with  Current Depression;Current Anxiety/Panic;Current Stress Concerns    Source of Stress Concerns  Financial    Comments  She is returning to work from being on FMLA due to her STEMI and taking care of her dad who passed away. He was moved to hospice the same day she had her STEMI. She is playing "catch up" on her bills now that she has returned to work. She has struggled with depression in the past and currently, she is getting her meds adjusted.       Family Dynamics   Good Support System?  Yes Long time boyfriend and children      Screening Interventions   Interventions  Yes;Encouraged to exercise;Program counselor consult    Expected Outcomes  Short Term goal: Utilizing psychosocial counselor, staff and physician to assist with identification of specific Stressors or current  issues interfering with healing process. Setting desired goal for each stressor or current issue identified.;Long Term Goal: Stressors or current issues are controlled or eliminated.;Short Term goal: Identification and review with participant of any Quality of Life or Depression concerns found by scoring the  questionnaire.;Long Term goal: The participant improves quality of Life and PHQ9 Scores as seen by post scores and/or verbalization of changes       Quality of Life Scores:  Quality of Life - 11/25/17 1350      Quality of Life Scores   Health/Function Pre  20.53 %    Socioeconomic Pre  20.5 %    Psych/Spiritual Pre  20.29 %    Family Pre  20.6 %    GLOBAL Pre  20.49 %       PHQ-9: Recent Review Flowsheet Data    Depression screen [facility] 2/9 11/25/2017 01/20/2016 12/12/2015 10/14/2015   Decreased Interest 2 0 0 0   Down, Depressed, Hopeless 3 0 0 0   PHQ - 2 Score 5 0 0 0   Altered sleeping 1 - - -   Tired, decreased energy 3 - - -   Change in appetite 3 - - -   Feeling bad or failure about yourself  0 - - -   Trouble concentrating 1 - - -   Moving slowly or fidgety/restless 0 - - -   Suicidal thoughts 0 - - -   PHQ-9 Score 13 - - -   Difficult doing work/chores Somewhat difficult - - -     Interpretation of Total Score  Total Score Depression Severity:  1-4 = Minimal depression, 5-9 = Mild depression, 10-14 = Moderate depression, 15-19 = Moderately severe depression, 20-27 = Severe depression   Psychosocial Evaluation and Intervention:   Psychosocial Re-Evaluation:   Psychosocial Discharge (Final Psychosocial Re-Evaluation):   Vocational Rehabilitation: Provide vocational rehab assistance to qualifying candidates.   Vocational Rehab Evaluation & Intervention:   Education: Education Goals: Education classes will be provided on a variety of topics geared toward better understanding of heart health and risk factor modification. Participant will state  understanding/return demonstration of topics presented as noted by education test scores.  Learning Barriers/Preferences: Learning Barriers/Preferences - 11/25/17 1352      Learning Barriers/Preferences   Learning Barriers  None    Learning Preferences  Group Instruction;Written Material       Education Topics: General Nutrition Guidelines/Fats and Fiber: -Group instruction provided by verbal, written material, models and posters to present the general guidelines for heart healthy nutrition. Gives an explanation and review of dietary fats and fiber.   Controlling Sodium/Reading Food Labels: -Group verbal and written material supporting the discussion of sodium use in heart healthy nutrition. Review and explanation with models, verbal and written materials for utilization of the food label.   Exercise Physiology & Risk Factors: - Group verbal and written instruction with models to review the exercise physiology of the cardiovascular system and associated critical values. Details cardiovascular disease risk factors and the goals associated with each risk factor.   Aerobic Exercise & Resistance Training: - Gives group verbal and written discussion on the health impact of inactivity. On the components of aerobic and resistive training programs and the benefits of this training and how to safely progress through these programs.   Flexibility, Balance, General Exercise Guidelines: - Provides group verbal and written instruction on the benefits of flexibility and balance training programs. Provides general exercise guidelines with specific guidelines to those with heart or lung disease. Demonstration and skill practice provided.   Stress Management: - Provides group verbal and written instruction about the health risks of elevated stress, cause of high stress, and healthy ways to reduce stress.   Depression: - Provides group verbal and written  instruction on the correlation between  heart/lung disease and depressed mood, treatment options, and the stigmas associated with seeking treatment.   Anatomy & Physiology of the Heart: - Group verbal and written instruction and models provide basic cardiac anatomy and physiology, with the coronary electrical and arterial systems. Review of: AMI, Angina, Valve disease, Heart Failure, Cardiac Arrhythmia, Pacemakers, and the ICD.   Cardiac Procedures: - Group verbal and written instruction to review commonly prescribed medications for heart disease. Reviews the medication, class of the drug, and side effects. Includes the steps to properly store meds and maintain the prescription regimen. (beta blockers and nitrates)   Cardiac Medications I: - Group verbal and written instruction to review commonly prescribed medications for heart disease. Reviews the medication, class of the drug, and side effects. Includes the steps to properly store meds and maintain the prescription regimen.   Cardiac Medications II: -Group verbal and written instruction to review commonly prescribed medications for heart disease. Reviews the medication, class of the drug, and side effects. (all other drug classes)    Go Sex-Intimacy & Heart Disease, Get SMART - Goal Setting: - Group verbal and written instruction through game format to discuss heart disease and the return to sexual intimacy. Provides group verbal and written material to discuss and apply goal setting through the application of the S.M.A.R.T. Method.   Other Matters of the Heart: - Provides group verbal, written materials and models to describe Heart Failure, Angina, Valve Disease, Peripheral Artery Disease, and Diabetes in the realm of heart disease. Includes description of the disease process and treatment options available to the cardiac patient.   Exercise & Equipment Safety: - Individual verbal instruction and demonstration of equipment use and safety with use of the equipment.    Cardiac Rehab from 11/25/2017 in [facility] Cardiac and Pulmonary Rehab  Date  11/25/17  Educator  mc  Instruction Review Code  1- Verbalizes Understanding      Infection Prevention: - Provides verbal and written material to individual with discussion of infection control including proper hand washing and proper equipment cleaning during exercise session.   Cardiac Rehab from 11/25/2017 in [facility] Cardiac and Pulmonary Rehab  Date  11/25/17  Educator  mc  Instruction Review Code  1- Verbalizes Understanding      Falls Prevention: - Provides verbal and written material to individual with discussion of falls prevention and safety.   Cardiac Rehab from 11/25/2017 in [facility] Cardiac and Pulmonary Rehab  Date  11/25/17  Educator  mc  Instruction Review Code  1- Verbalizes Understanding      Diabetes: - Individual verbal and written instruction to review signs/symptoms of diabetes, desired ranges of glucose level fasting, after meals and with exercise. Acknowledge that pre and post exercise glucose checks will be done for 3 sessions at entry of program.   Other: -Provides group and verbal instruction on various topics (see comments)    Knowledge Questionnaire Score: Knowledge Questionnaire Score - 11/25/17 1352      Knowledge Questionnaire Score   Pre Score  25/28 correct answers reviewed with Ch[place]ell       Core Components/Risk Factors/Patient Goals at Admission: Personal Goals and Risk Factors at Admission - 11/25/17 1326      Core Components/Risk Factors/Patient Goals on Admission    Weight Management  Obesity;Weight Loss;Yes    Intervention  Weight Management: Develop a combined nutrition and exercise program designed to reach desired caloric intake, while maintaining appropriate intake of nutrient and fiber, sodium and fats,  and appropriate energy expenditure required for the weight goal.;Obesity: Provide education and appropriate resources to help participant work on and attain  dietary goals.;Weight Management/Obesity: Establish reasonable short term and long term weight goals.;Weight Management: Provide education and appropriate resources to help participant work on and attain dietary goals.    Admit Weight  182 lb 14.4 oz (83 kg)    Goal Weight: Short Term  178 lb (80.7 kg)    Goal Weight: Long Term  135 lb (61.2 kg)    Expected Outcomes  Short Term: Continue to assess and modify interventions until short term weight is achieved;Long Term: Adherence to nutrition and physical activity/exercise program aimed toward attainment of established weight goal;Weight Loss: Understanding of general recommendations for a balanced deficit meal plan, which promotes 1-2 lb weight loss per week and includes a negative energy balance of [phone] kcal/d;Understanding recommendations for meals to include 15-35% energy as protein, 25-35% energy from fat, 35-60% energy from carbohydrates, less than 200mg  of dietary cholesterol, 20-35 gm of total fiber daily;Understanding of distribution of calorie intake throughout the day with the consumption of 4-5 meals/snacks    Lipids  Yes    Intervention  Provide education and support for participant on nutrition & aerobic/resistive exercise along with prescribed medications to achieve LDL 70mg , HDL >40mg .    Expected Outcomes  Short Term: Participant states understanding of desired cholesterol values and is compliant with medications prescribed. Participant is following exercise prescription and nutrition guidelines.;Long Term: Cholesterol controlled with medications as prescribed, with individualized exercise RX and with personalized nutrition plan. Value goals: LDL < 70mg , HDL > 40 mg.    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  Teach and support self-help strategies.;Refer participants experiencing significant psychosocial distress to appropriate mental health specialists for further evaluation and treatment. When possible, include family members and significant others in education/counseling sessions.    Expected Outcomes  Short Term: Participant demonstrates changes in health-related behavior, relaxation and other stress management skills, ability to obtain effective social support, and compliance with psychotropic medications if prescribed.;Long Term: Emotional wellbeing is indicated by absence of clinically significant psychosocial distress or social isolation.       Core Components/Risk Factors/Patient Goals Review:    Core Components/Risk Factors/Patient Goals at Discharge (Final Review):    ITP Comments: ITP Comments    Row Name 11/25/17 1322 12/01/17 0628         ITP Comments  Med review completed. Initial ITP created. Diagnosis can be found in [facility] 10/26/17  30 day review. Continue with ITP unless directed changes per Medical Director review.   New to program         Comments:

## 2017-12-02 ENCOUNTER — Encounter: Payer: BC Managed Care – PPO | Admitting: *Deleted

## 2017-12-02 DIAGNOSIS — I213 ST elevation (STEMI) myocardial infarction of unspecified site: Secondary | ICD-10-CM | POA: Diagnosis not present

## 2017-12-02 NOTE — Progress Notes (Signed)
Daily Session Note  Patient Details  Name: Angela Mccullough MRN: [id number] Date of Birth: [birth year] Referring Provider:     Cardiac Rehab from 11/25/2017 in [facility] Cardiac and Pulmonary Rehab  Referring Provider  [name]      Encounter Date: 12/02/2017  Check In: Session Check In - 12/02/17 1657      Check-In   Location  [facility]-Cardiac & Pulmonary Rehab    Staff Present  Earlean Shawl, BS, ACSM CEP, Exercise Physiologist;Meredith Sherryll Burger, RN BSN;Joseph Flavia Shipper    Supervising physician immediately available to respond to emergencies  See telemetry face sheet for immediately available ER MD    Medication changes reported      No    Fall or balance concerns reported     No    Tobacco Cessation  Use Decreased down to 5 cigarettes today    Warm-up and Cool-down  Performed on first and last piece of equipment    Resistance Training Performed  Yes    VAD Patient?  No      Pain Assessment   Currently in Pain?  No/denies    Multiple Pain Sites  No          Social History   Tobacco Use  Smoking Status Current Every Day Smoker  . Packs/day: 0.50  . Types: Cigarettes  Smokeless Tobacco Current User  Tobacco Comment   Cutting down to five cigs a day 11/25/17    Goals Met:  Independence with exercise equipment Exercise tolerated well No report of cardiac concerns or symptoms Strength training completed today  Goals Unmet:  Not Applicable  Comments: Pt able to follow exercise prescription today without complaint.  Will continue to monitor for progression.    Dr. Emily Filbert is Medical Director for [facility].

## 2017-12-22 ENCOUNTER — Telehealth: Payer: Self-pay

## 2017-12-22 NOTE — Telephone Encounter (Signed)
LMOM

## 2017-12-24 ENCOUNTER — Telehealth: Payer: Self-pay

## 2017-12-24 NOTE — Telephone Encounter (Signed)
Charnele is moving and plans to be back after the first of the year.

## 2017-12-29 ENCOUNTER — Encounter: Payer: BC Managed Care – PPO | Attending: Internal Medicine

## 2017-12-29 ENCOUNTER — Encounter: Payer: Self-pay | Admitting: *Deleted

## 2017-12-29 DIAGNOSIS — F419 Anxiety disorder, unspecified: Secondary | ICD-10-CM | POA: Insufficient documentation

## 2017-12-29 DIAGNOSIS — Z79899 Other long term (current) drug therapy: Secondary | ICD-10-CM | POA: Insufficient documentation

## 2017-12-29 DIAGNOSIS — F329 Major depressive disorder, single episode, unspecified: Secondary | ICD-10-CM | POA: Insufficient documentation

## 2017-12-29 DIAGNOSIS — I213 ST elevation (STEMI) myocardial infarction of unspecified site: Secondary | ICD-10-CM

## 2017-12-29 DIAGNOSIS — Z7982 Long term (current) use of aspirin: Secondary | ICD-10-CM | POA: Insufficient documentation

## 2017-12-29 DIAGNOSIS — Z7902 Long term (current) use of antithrombotics/antiplatelets: Secondary | ICD-10-CM | POA: Insufficient documentation

## 2017-12-29 DIAGNOSIS — F17210 Nicotine dependence, cigarettes, uncomplicated: Secondary | ICD-10-CM | POA: Insufficient documentation

## 2017-12-29 NOTE — Progress Notes (Signed)
Cardiac Individual Treatment Plan  Patient Details  Name: Angela Mccullough MRN: [id number] Date of Birth: 05-07-1966 Referring Provider:     Cardiac Rehab from 11/25/2017 in [facility] Cardiac and Pulmonary Rehab  Referring Provider  [name]      Initial Encounter Date:    Cardiac Rehab from 11/25/2017 in [facility] Cardiac and Pulmonary Rehab  Date  11/25/17  Referring Provider  [name]      Visit Diagnosis: ST elevation myocardial infarction (STEMI), unspecified artery ([place])  Patient's Home Medications on Admission:  Current Outpatient Medications:  .  albuterol (PROVENTIL HFA;VENTOLIN HFA) 108 (90 BASE) MCG/ACT inhaler, Inhale 2 puffs into the lungs every 6 (six) hours as needed for wheezing or shortness of breath. (Patient not taking: Reported on 11/25/2017), Disp: 1 Inhaler, Rfl: 12 .  aspirin 81 MG chewable tablet, Chew 1 tablet (81 mg total) by mouth daily., Disp: 30 tablet, Rfl: 1 .  atorvastatin (LIPITOR) 80 MG tablet, Take 1 tablet (80 mg total) by mouth daily at 6 PM., Disp: 30 tablet, Rfl: 1 .  baclofen (LIORESAL) 10 MG tablet, Take 0.5-1 tablets (5-10 mg total) by mouth 3 (three) times daily as needed for muscle spasms., Disp: 30 each, Rfl: 1 .  carvedilol (COREG) 3.125 MG tablet, Take 1 tablet (3.125 mg total) by mouth 2 (two) times daily with a meal., Disp: 60 tablet, Rfl: 1 .  cetirizine (ZYRTEC) 10 MG tablet, Take 10 mg by mouth daily., Disp: , Rfl:  .  Cholecalciferol 2000 units CAPS, Take 1 capsule (2,000 Units total) by mouth daily. (Patient not taking: Reported on 11/25/2017), Disp: 30 each, Rfl:  .  citalopram (CELEXA) 20 MG tablet, Take 20 mg by mouth daily., Disp: , Rfl:  .  citalopram (CELEXA) 40 MG tablet, , Disp: , Rfl:  .  clopidogrel (PLAVIX) 75 MG tablet, Take 1 tablet (75 mg total) by mouth daily with breakfast., Disp: 30 tablet, Rfl: 1 .  diphenhydrAMINE (BENADRYL) 25 MG tablet, Take 25 mg by mouth every 4 (four) hours as needed., Disp: , Rfl:  .  fluticasone  (FLOVENT HFA) 110 MCG/ACT inhaler, Inhale 1 puff into the lungs 2 (two) times daily. (Patient not taking: Reported on 11/25/2017), Disp: 1 Inhaler, Rfl: 12 .  mometasone (NASONEX) 50 MCG/ACT nasal spray, Place 2 sprays into the nose daily. (Patient not taking: Reported on 11/25/2017), Disp: 17 g, Rfl: 12 .  Psyllium 28.3 % POWD, Take 500 mg by mouth daily. (Patient not taking: Reported on 10/28/2017), Disp: 283 g, Rfl:   Past Medical History: Past Medical History:  Diagnosis Date  . Anxiety and depression   . ASD (atrial septal defect), common atrium (single atrium)   . Heart murmur   . Heartburn   . Kidney stone     Tobacco Use: Social History   Tobacco Use  Smoking Status Current Every Day Smoker  . Packs/day: 0.50  . Types: Cigarettes  Smokeless Tobacco Current User  Tobacco Comment   Cutting down to five cigs a day 11/25/17    Labs: Recent Review Flowsheet Data    Labs for ITP Cardiac and Pulmonary Rehab Latest Ref Rng & Units 07/09/2009 11/26/2015 10/26/2017   Cholestrol 0 - 200 mg/dL - 214(H) 213(H)   LDLCALC 0 - 99 mg/dL - 128(H) 130(H)   LDLDIRECT mg/dL 130.4 - -   HDL >40 mg/dL - 63 49   Trlycerides <150 mg/dL - 115 172(H)   Hemoglobin A1c 4.8 - 5.6 % - - 5.4  Exercise Target Goals:    Exercise Program Goal: Individual exercise prescription set with THRR, safety & activity barriers. Participant demonstrates ability to understand and report RPE using BORG scale, to self-measure pulse accurately, and to acknowledge the importance of the exercise prescription.  Exercise Prescription Goal: Starting with aerobic activity 30 plus minutes a day, 3 days per week for initial exercise prescription. Provide home exercise prescription and guidelines that participant acknowledges understanding prior to discharge.  Activity Barriers & Risk Stratification: Activity Barriers & Cardiac Risk Stratification - 11/25/17 1355      Activity Barriers & Cardiac Risk  Stratification   Activity Barriers  None    Cardiac Risk Stratification  Moderate       6 Minute Walk: 6 Minute Walk    Row Name 11/25/17 1452         6 Minute Walk   Distance  1350 feet     Walk Time  6 minutes     # of Rest Breaks  0     MPH  2.55     METS  3.68     RPE  12     Perceived Dyspnea   1     VO2 Peak  12.89     Symptoms  No     Resting HR  68 bpm     Resting BP  126/70     Resting Oxygen Saturation   97 %     Exercise Oxygen Saturation  during 6 min walk  98 %     Max Ex. HR  104 bpm     Max Ex. BP  122/60     2 Minute Post BP  106/58        Oxygen Initial Assessment:   Oxygen Re-Evaluation:   Oxygen Discharge (Final Oxygen Re-Evaluation):   Initial Exercise Prescription: Initial Exercise Prescription - 11/25/17 1400      Date of Initial Exercise RX and Referring Provider   Date  11/25/17    Referring Provider  [name]      Treadmill   MPH  2.5    Grade  1.5    Minutes  15    METs  3.5      Recumbant Bike   Level  5    RPM  60    Watts  45    Minutes  15    METs  3.6      NuStep   Level  4    SPM  80    Minutes  15    METs  3.5      Elliptical   Level  1    Speed  2.5    Minutes  15    METs  3.5      Prescription Details   Frequency (times per week)  3    Duration  Progress to 45 minutes of aerobic exercise without signs/symptoms of physical distress      Intensity   THRR 40-80% of Max Heartrate  108-149    Ratings of Perceived Exertion  11-13    Perceived Dyspnea  0-4      Resistance Training   Training Prescription  Yes    Weight  3 lb    Reps  10-15       Perform Capillary Blood Glucose checks as needed.  Exercise Prescription Changes: Exercise Prescription Changes    Row Name 11/25/17 1300 12/10/17 1100           Response to  Exercise   Blood Pressure (Admit)  126/70  110/62      Blood Pressure (Exercise)  122/66  122/62      Blood Pressure (Exit)  106/58  112/60      Heart Rate (Admit)  81 bpm  71  bpm      Heart Rate (Exercise)  104 bpm  144 bpm      Heart Rate (Exit)  84 bpm  101 bpm      Oxygen Saturation (Admit)  97 %  -      Rating of Perceived Exertion (Exercise)  12  15      Symptoms  -  none      Comments  -  ellipical difficult      Duration  -  Progress to 45 minutes of aerobic exercise without signs/symptoms of physical distress      Intensity  -  THRR unchanged        Progression   Progression  -  Continue to progress workloads to maintain intensity without signs/symptoms of physical distress.        Treadmill   MPH  -  2.5      Grade  -  1.5      Minutes  -  15      METs  -  3.5        Recumbant Bike   Level  -  5      RPM  -  60      Watts  -  18      Minutes  -  15      METs  -  2.6        Elliptical   Level  -  1      Speed  -  3      Minutes  -  15         Exercise Comments: Exercise Comments    Row Name 12/01/17 1646           Exercise Comments  First full day of exercise!  Patient was oriented to gym and equipment including functions, settings, policies, and procedures.  Patient's individual exercise prescription and treatment plan were reviewed.  All starting workloads were established based on the results of the 6 minute walk test done at initial orientation visit.  The plan for exercise progression was also introduced and progression will be customized based on patient's performance and goals.          Exercise Goals and Review: Exercise Goals    Row Name 11/25/17 1452             Exercise Goals   Increase Physical Activity  Yes       Intervention  Provide advice, education, support and counseling about physical activity/exercise needs.;Develop an individualized exercise prescription for aerobic and resistive training based on initial evaluation findings, risk stratification, comorbidities and participant's personal goals.       Expected Outcomes  Achievement of increased cardiorespiratory fitness and enhanced flexibility, muscular  endurance and strength shown through measurements of functional capacity and personal statement of participant.       Increase Strength and Stamina  Yes       Intervention  Provide advice, education, support and counseling about physical activity/exercise needs.;Develop an individualized exercise prescription for aerobic and resistive training based on initial evaluation findings, risk stratification, comorbidities and participant's personal goals.       Expected Outcomes  Achievement of increased cardiorespiratory  fitness and enhanced flexibility, muscular endurance and strength shown through measurements of functional capacity and personal statement of participant.       Able to understand and use rate of perceived exertion (RPE) scale  Yes       Intervention  Provide education and explanation on how to use RPE scale       Expected Outcomes  Short Term: Able to use RPE daily in rehab to express subjective intensity level;Long Term:  Able to use RPE to guide intensity level when exercising independently       Able to understand and use Dyspnea scale  Yes       Intervention  Provide education and explanation on how to use Dyspnea scale       Expected Outcomes  Short Term: Able to use Dyspnea scale daily in rehab to express subjective sense of shortness of breath during exertion;Long Term: Able to use Dyspnea scale to guide intensity level when exercising independently       Knowledge and understanding of Target Heart Rate Range (THRR)  Yes       Intervention  Provide education and explanation of THRR including how the numbers were predicted and where they are located for reference       Expected Outcomes  Short Term: Able to state/look up THRR;Short Term: Able to use daily as guideline for intensity in rehab;Long Term: Able to use THRR to govern intensity when exercising independently       Able to check pulse independently  Yes       Intervention  Provide education and demonstration on how to check  pulse in carotid and radial arteries.;Review the importance of being able to check your own pulse for safety during independent exercise       Expected Outcomes  Short Term: Able to explain why pulse checking is important during independent exercise;Long Term: Able to check pulse independently and accurately       Understanding of Exercise Prescription  Yes       Intervention  Provide education, explanation, and written materials on patient's individual exercise prescription       Expected Outcomes  Short Term: Able to explain program exercise prescription;Long Term: Able to explain home exercise prescription to exercise independently          Exercise Goals Re-Evaluation : Exercise Goals Re-Evaluation    Row Name 12/01/17 1625 12/10/17 1208           Exercise Goal Re-Evaluation   Exercise Goals Review  Able to understand and use rate of perceived exertion (RPE) scale;Increase Strength and Stamina;Increase Physical Activity;Understanding of Exercise Prescription  Increase Physical Activity;Increase Strength and Stamina;Able to understand and use rate of perceived exertion (RPE) scale      Comments  Reviewed RPE scale, THR and program prescription with pt today.  Pt voiced understanding and was given a copy of goals to take home.   Thurma is doing well in her first sessions.  She found the elliptical challenging, however, is motivated to work towards getting more endurance.      Expected Outcomes  Short: Use RPE daily to regulate intensity.  Long: Follow program prescription in THR.  Short - Kathya will attend regularly [place] will be able to do 15 continuous minutes on elliptical         Discharge Exercise Prescription (Final Exercise Prescription Changes): Exercise Prescription Changes - 12/10/17 1100      Response to Exercise   Blood Pressure (Admit)  110/62    Blood Pressure (Exercise)  122/62    Blood Pressure (Exit)  112/60    Heart Rate (Admit)  71 bpm    Heart Rate  (Exercise)  144 bpm    Heart Rate (Exit)  101 bpm    Rating of Perceived Exertion (Exercise)  15    Symptoms  none    Comments  ellipical difficult    Duration  Progress to 45 minutes of aerobic exercise without signs/symptoms of physical distress    Intensity  THRR unchanged      Progression   Progression  Continue to progress workloads to maintain intensity without signs/symptoms of physical distress.      Treadmill   MPH  2.5    Grade  1.5    Minutes  15    METs  3.5      Recumbant Bike   Level  5    RPM  60    Watts  18    Minutes  15    METs  2.6      Elliptical   Level  1    Speed  3    Minutes  15       Nutrition:  Target Goals: Understanding of nutrition guidelines, daily intake of sodium 1500mg , cholesterol 200mg , calories 30% from fat and 7% or less from saturated fats, daily to have 5 or more servings of fruits and vegetables.  Biometrics: Pre Biometrics - 11/25/17 1451      Pre Biometrics   Height  5\' 2"  (1.575 m)    Weight  182 lb 14.4 oz (83 kg)    Waist Circumference  38 inches    Hip Circumference  46 inches    Waist to Hip Ratio  0.83 %    BMI (Calculated)  33.44    Single Leg Stand  5.19 seconds        Nutrition Therapy Plan and Nutrition Goals:   Nutrition Discharge: Rate Your Plate Scores: Nutrition Assessments - 11/25/17 1331      MEDFICTS Scores   Pre Score  65       Nutrition Goals Re-Evaluation:   Nutrition Goals Discharge (Final Nutrition Goals Re-Evaluation):   Psychosocial: Target Goals: Acknowledge presence or absence of significant depression and/or stress, maximize coping skills, provide positive support system. Participant is able to verbalize types and ability to use techniques and skills needed for reducing stress and depression.   Initial Review & Psychosocial Screening: Initial Psych Review & Screening - 11/25/17 1344      Initial Review   Current issues with  Current Depression;Current Anxiety/Panic;Current  Stress Concerns    Source of Stress Concerns  Financial    Comments  She is returning to work from being on FMLA due to her STEMI and taking care of her dad who passed away. He was moved to hospice the same day she had her STEMI. She is playing "catch up" on her bills now that she has returned to work. She has struggled with depression in the past and currently, she is getting her meds adjusted.       Family Dynamics   Good Support System?  Yes Long time boyfriend and children      Screening Interventions   Interventions  Yes;Encouraged to exercise;Program counselor consult    Expected Outcomes  Short Term goal: Utilizing psychosocial counselor, staff and physician to assist with identification of specific Stressors or current issues interfering with healing process. Setting desired goal for each  stressor or current issue identified.;Long Term Goal: Stressors or current issues are controlled or eliminated.;Short Term goal: Identification and review with participant of any Quality of Life or Depression concerns found by scoring the questionnaire.;Long Term goal: The participant improves quality of Life and PHQ9 Scores as seen by post scores and/or verbalization of changes       Quality of Life Scores:  Quality of Life - 11/25/17 1350      Quality of Life Scores   Health/Function Pre  20.53 %    Socioeconomic Pre  20.5 %    Psych/Spiritual Pre  20.29 %    Family Pre  20.6 %    GLOBAL Pre  20.49 %       PHQ-9: Recent Review Flowsheet Data    Depression screen [facility] 2/9 11/25/2017 01/20/2016 12/12/2015 10/14/2015   Decreased Interest 2 0 0 0   Down, Depressed, Hopeless 3 0 0 0   PHQ - 2 Score 5 0 0 0   Altered sleeping 1 - - -   Tired, decreased energy 3 - - -   Change in appetite 3 - - -   Feeling bad or failure about yourself  0 - - -   Trouble concentrating 1 - - -   Moving slowly or fidgety/restless 0 - - -   Suicidal thoughts 0 - - -   PHQ-9 Score 13 - - -   Difficult doing  work/chores Somewhat difficult - - -     Interpretation of Total Score  Total Score Depression Severity:  1-4 = Minimal depression, 5-9 = Mild depression, 10-14 = Moderate depression, 15-19 = Moderately severe depression, 20-27 = Severe depression   Psychosocial Evaluation and Intervention:   Psychosocial Re-Evaluation:   Psychosocial Discharge (Final Psychosocial Re-Evaluation):   Vocational Rehabilitation: Provide vocational rehab assistance to qualifying candidates.   Vocational Rehab Evaluation & Intervention:   Education: Education Goals: Education classes will be provided on a variety of topics geared toward better understanding of heart health and risk factor modification. Participant will state understanding/return demonstration of topics presented as noted by education test scores.  Learning Barriers/Preferences: Learning Barriers/Preferences - 11/25/17 1352      Learning Barriers/Preferences   Learning Barriers  None    Learning Preferences  Group Instruction;Written Material       Education Topics: General Nutrition Guidelines/Fats and Fiber: -Group instruction provided by verbal, written material, models and posters to present the general guidelines for heart healthy nutrition. Gives an explanation and review of dietary fats and fiber.   Controlling Sodium/Reading Food Labels: -Group verbal and written material supporting the discussion of sodium use in heart healthy nutrition. Review and explanation with models, verbal and written materials for utilization of the food label.   Exercise Physiology & Risk Factors: - Group verbal and written instruction with models to review the exercise physiology of the cardiovascular system and associated critical values. Details cardiovascular disease risk factors and the goals associated with each risk factor.   Aerobic Exercise & Resistance Training: - Gives group verbal and written discussion on the health impact of  inactivity. On the components of aerobic and resistive training programs and the benefits of this training and how to safely progress through these programs.   Flexibility, Balance, General Exercise Guidelines: - Provides group verbal and written instruction on the benefits of flexibility and balance training programs. Provides general exercise guidelines with specific guidelines to those with heart or lung disease. Demonstration and skill practice provided.  Stress Management: - Provides group verbal and written instruction about the health risks of elevated stress, cause of high stress, and healthy ways to reduce stress.   Depression: - Provides group verbal and written instruction on the correlation between heart/lung disease and depressed mood, treatment options, and the stigmas associated with seeking treatment.   Anatomy & Physiology of the Heart: - Group verbal and written instruction and models provide basic cardiac anatomy and physiology, with the coronary electrical and arterial systems. Review of: AMI, Angina, Valve disease, Heart Failure, Cardiac Arrhythmia, Pacemakers, and the ICD.   Cardiac Procedures: - Group verbal and written instruction to review commonly prescribed medications for heart disease. Reviews the medication, class of the drug, and side effects. Includes the steps to properly store meds and maintain the prescription regimen. (beta blockers and nitrates)   Cardiac Medications I: - Group verbal and written instruction to review commonly prescribed medications for heart disease. Reviews the medication, class of the drug, and side effects. Includes the steps to properly store meds and maintain the prescription regimen.   Cardiac Medications II: -Group verbal and written instruction to review commonly prescribed medications for heart disease. Reviews the medication, class of the drug, and side effects. (all other drug classes)    Go Sex-Intimacy & Heart  Disease, Get SMART - Goal Setting: - Group verbal and written instruction through game format to discuss heart disease and the return to sexual intimacy. Provides group verbal and written material to discuss and apply goal setting through the application of the S.M.A.R.T. Method.   Other Matters of the Heart: - Provides group verbal, written materials and models to describe Heart Failure, Angina, Valve Disease, Peripheral Artery Disease, and Diabetes in the realm of heart disease. Includes description of the disease process and treatment options available to the cardiac patient.   Exercise & Equipment Safety: - Individual verbal instruction and demonstration of equipment use and safety with use of the equipment.   Cardiac Rehab from 11/25/2017 in [facility] Cardiac and Pulmonary Rehab  Date  11/25/17  Educator  mc  Instruction Review Code  1- Verbalizes Understanding      Infection Prevention: - Provides verbal and written material to individual with discussion of infection control including proper hand washing and proper equipment cleaning during exercise session.   Cardiac Rehab from 11/25/2017 in [facility] Cardiac and Pulmonary Rehab  Date  11/25/17  Educator  mc  Instruction Review Code  1- Verbalizes Understanding      Falls Prevention: - Provides verbal and written material to individual with discussion of falls prevention and safety.   Cardiac Rehab from 11/25/2017 in [facility] Cardiac and Pulmonary Rehab  Date  11/25/17  Educator  mc  Instruction Review Code  1- Verbalizes Understanding      Diabetes: - Individual verbal and written instruction to review signs/symptoms of diabetes, desired ranges of glucose level fasting, after meals and with exercise. Acknowledge that pre and post exercise glucose checks will be done for 3 sessions at entry of program.   Other: -Provides group and verbal instruction on various topics (see comments)    Knowledge Questionnaire Score: Knowledge  Questionnaire Score - 11/25/17 1352      Knowledge Questionnaire Score   Pre Score  25/28 correct answers reviewed with Ma[place]       Core Components/Risk Factors/Patient Goals at Admission: Personal Goals and Risk Factors at Admission - 11/25/17 1326      Core Components/Risk Factors/Patient Goals on Admission    Weight  Management  Obesity;Weight Loss;Yes    Intervention  Weight Management: Develop a combined nutrition and exercise program designed to reach desired caloric intake, while maintaining appropriate intake of nutrient and fiber, sodium and fats, and appropriate energy expenditure required for the weight goal.;Obesity: Provide education and appropriate resources to help participant work on and attain dietary goals.;Weight Management/Obesity: Establish reasonable short term and long term weight goals.;Weight Management: Provide education and appropriate resources to help participant work on and attain dietary goals.    Admit Weight  182 lb 14.4 oz (83 kg)    Goal Weight: Short Term  178 lb (80.7 kg)    Goal Weight: Long Term  135 lb (61.2 kg)    Expected Outcomes  Short Term: Continue to assess and modify interventions until short term weight is achieved;Long Term: Adherence to nutrition and physical activity/exercise program aimed toward attainment of established weight goal;Weight Loss: Understanding of general recommendations for a balanced deficit meal plan, which promotes 1-2 lb weight loss per week and includes a negative energy balance of [phone] kcal/d;Understanding recommendations for meals to include 15-35% energy as protein, 25-35% energy from fat, 35-60% energy from carbohydrates, less than 200mg  of dietary cholesterol, 20-35 gm of total fiber daily;Understanding of distribution of calorie intake throughout the day with the consumption of 4-5 meals/snacks    Lipids  Yes    Intervention  Provide education and support for participant on nutrition & aerobic/resistive exercise  along with prescribed medications to achieve LDL 70mg , HDL >40mg .    Expected Outcomes  Short Term: Participant states understanding of desired cholesterol values and is compliant with medications prescribed. Participant is following exercise prescription and nutrition guidelines.;Long Term: Cholesterol controlled with medications as prescribed, with individualized exercise RX and with personalized nutrition plan. Value goals: LDL < 70mg , HDL > 40 mg.    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 Teach and support self-help strategies.;Refer participants experiencing significant psychosocial distress to appropriate mental health specialists for further evaluation and treatment. When possible, include family members and significant others in education/counseling sessions.    Expected Outcomes  Short Term: Participant demonstrates changes in health-related behavior, relaxation and other stress management skills, ability to obtain effective social support, and compliance with psychotropic medications if prescribed.;Long Term: Emotional wellbeing is indicated by absence of clinically significant psychosocial distress or social isolation.       Core Components/Risk Factors/Patient Goals Review:    Core Components/Risk Factors/Patient Goals at Discharge (Final Review):    ITP Comments: ITP Comments    Row Name 11/25/17 1322 12/01/17 0628 12/24/17 1027 12/29/17 1024     ITP Comments  Med review completed. Initial ITP created. Diagnosis can be found in [facility] 10/26/17  30 day review. Continue with ITP unless directed changes per Medical Director review.   New to program  Kiaira has been out since 12/6  due to the weather and moving and plans to return after the first of the year.  30  day review. Continue with ITP unless directed changes per Medical Director review.   2 visits this month       Comments:

## 2018-01-05 ENCOUNTER — Telehealth: Payer: Self-pay

## 2018-01-05 NOTE — Telephone Encounter (Signed)
Angela Mccullough called back - today is her last day moving and she plans to return next week

## 2018-01-05 NOTE — Telephone Encounter (Signed)
LMOM

## 2018-01-26 ENCOUNTER — Encounter: Payer: Self-pay | Admitting: *Deleted

## 2018-01-26 DIAGNOSIS — I213 ST elevation (STEMI) myocardial infarction of unspecified site: Secondary | ICD-10-CM

## 2018-01-26 NOTE — Progress Notes (Signed)
Cardiac Individual Treatment Plan  Patient Details  Name: VERNISHA BACOTE MRN: [id number] Date of Birth: 05-07-1966 Referring Provider:     Cardiac Rehab from 11/25/2017 in [facility] Cardiac and Pulmonary Rehab  Referring Provider  [name]      Initial Encounter Date:    Cardiac Rehab from 11/25/2017 in [facility] Cardiac and Pulmonary Rehab  Date  11/25/17  Referring Provider  [name]      Visit Diagnosis: ST elevation myocardial infarction (STEMI), unspecified artery ([place])  Patient's Home Medications on Admission:  Current Outpatient Medications:  .  albuterol (PROVENTIL HFA;VENTOLIN HFA) 108 (90 BASE) MCG/ACT inhaler, Inhale 2 puffs into the lungs every 6 (six) hours as needed for wheezing or shortness of breath. (Patient not taking: Reported on 11/25/2017), Disp: 1 Inhaler, Rfl: 12 .  aspirin 81 MG chewable tablet, Chew 1 tablet (81 mg total) by mouth daily., Disp: 30 tablet, Rfl: 1 .  atorvastatin (LIPITOR) 80 MG tablet, Take 1 tablet (80 mg total) by mouth daily at 6 PM., Disp: 30 tablet, Rfl: 1 .  baclofen (LIORESAL) 10 MG tablet, Take 0.5-1 tablets (5-10 mg total) by mouth 3 (three) times daily as needed for muscle spasms., Disp: 30 each, Rfl: 1 .  carvedilol (COREG) 3.125 MG tablet, Take 1 tablet (3.125 mg total) by mouth 2 (two) times daily with a meal., Disp: 60 tablet, Rfl: 1 .  cetirizine (ZYRTEC) 10 MG tablet, Take 10 mg by mouth daily., Disp: , Rfl:  .  Cholecalciferol 2000 units CAPS, Take 1 capsule (2,000 Units total) by mouth daily. (Patient not taking: Reported on 11/25/2017), Disp: 30 each, Rfl:  .  citalopram (CELEXA) 20 MG tablet, Take 20 mg by mouth daily., Disp: , Rfl:  .  citalopram (CELEXA) 40 MG tablet, , Disp: , Rfl:  .  clopidogrel (PLAVIX) 75 MG tablet, Take 1 tablet (75 mg total) by mouth daily with breakfast., Disp: 30 tablet, Rfl: 1 .  diphenhydrAMINE (BENADRYL) 25 MG tablet, Take 25 mg by mouth every 4 (four) hours as needed., Disp: , Rfl:  .  fluticasone  (FLOVENT HFA) 110 MCG/ACT inhaler, Inhale 1 puff into the lungs 2 (two) times daily. (Patient not taking: Reported on 11/25/2017), Disp: 1 Inhaler, Rfl: 12 .  mometasone (NASONEX) 50 MCG/ACT nasal spray, Place 2 sprays into the nose daily. (Patient not taking: Reported on 11/25/2017), Disp: 17 g, Rfl: 12 .  Psyllium 28.3 % POWD, Take 500 mg by mouth daily. (Patient not taking: Reported on 10/28/2017), Disp: 283 g, Rfl:   Past Medical History: Past Medical History:  Diagnosis Date  . Anxiety and depression   . ASD (atrial septal defect), common atrium (single atrium)   . Heart murmur   . Heartburn   . Kidney stone     Tobacco Use: Social History   Tobacco Use  Smoking Status Current Every Day Smoker  . Packs/day: 0.50  . Types: Cigarettes  Smokeless Tobacco Current User  Tobacco Comment   Cutting down to five cigs a day 11/25/17    Labs: Recent Review Flowsheet Data    Labs for ITP Cardiac and Pulmonary Rehab Latest Ref Rng & Units 07/09/2009 11/26/2015 10/26/2017   Cholestrol 0 - 200 mg/dL - 214(H) 213(H)   LDLCALC 0 - 99 mg/dL - 128(H) 130(H)   LDLDIRECT mg/dL 130.4 - -   HDL >40 mg/dL - 63 49   Trlycerides <150 mg/dL - 115 172(H)   Hemoglobin A1c 4.8 - 5.6 % - - 5.4  Exercise Target Goals:    Exercise Program Goal: Individual exercise prescription set using results from initial 6 min walk test and THRR while considering  patient's activity barriers and safety.   Exercise Prescription Goal: Initial exercise prescription builds to 30-45 minutes a day of aerobic activity, 2-3 days per week.  Home exercise guidelines will be given to patient during program as part of exercise prescription that the participant will acknowledge.  Activity Barriers & Risk Stratification: Activity Barriers & Cardiac Risk Stratification - 11/25/17 1355      Activity Barriers & Cardiac Risk Stratification   Activity Barriers  None    Cardiac Risk Stratification  Moderate       6  Minute Walk: 6 Minute Walk    Row Name 11/25/17 1452         6 Minute Walk   Distance  1350 feet     Walk Time  6 minutes     # of Rest Breaks  0     MPH  2.55     METS  3.68     RPE  12     Perceived Dyspnea   1     VO2 Peak  12.89     Symptoms  No     Resting HR  68 bpm     Resting BP  126/70     Resting Oxygen Saturation   97 %     Exercise Oxygen Saturation  during 6 min walk  98 %     Max Ex. HR  104 bpm     Max Ex. BP  122/60     2 Minute Post BP  106/58        Oxygen Initial Assessment:   Oxygen Re-Evaluation:   Oxygen Discharge (Final Oxygen Re-Evaluation):   Initial Exercise Prescription: Initial Exercise Prescription - 11/25/17 1400      Date of Initial Exercise RX and Referring Provider   Date  11/25/17    Referring Provider  [name]      Treadmill   MPH  2.5    Grade  1.5    Minutes  15    METs  3.5      Recumbant Bike   Level  5    RPM  60    Watts  45    Minutes  15    METs  3.6      NuStep   Level  4    SPM  80    Minutes  15    METs  3.5      Elliptical   Level  1    Speed  2.5    Minutes  15    METs  3.5      Prescription Details   Frequency (times per week)  3    Duration  Progress to 45 minutes of aerobic exercise without signs/symptoms of physical distress      Intensity   THRR 40-80% of Max Heartrate  108-149    Ratings of Perceived Exertion  11-13    Perceived Dyspnea  0-4      Resistance Training   Training Prescription  Yes    Weight  3 lb    Reps  10-15       Perform Capillary Blood Glucose checks as needed.  Exercise Prescription Changes: Exercise Prescription Changes    Row Name 11/25/17 1300 12/10/17 1100           Response to Exercise   Blood  Pressure (Admit)  126/70  110/62      Blood Pressure (Exercise)  122/66  122/62      Blood Pressure (Exit)  106/58  112/60      Heart Rate (Admit)  81 bpm  71 bpm      Heart Rate (Exercise)  104 bpm  144 bpm      Heart Rate (Exit)  84 bpm  101 bpm       Oxygen Saturation (Admit)  97 %  -      Rating of Perceived Exertion (Exercise)  12  15      Symptoms  -  none      Comments  -  ellipical difficult      Duration  -  Progress to 45 minutes of aerobic exercise without signs/symptoms of physical distress      Intensity  -  THRR unchanged        Progression   Progression  -  Continue to progress workloads to maintain intensity without signs/symptoms of physical distress.        Treadmill   MPH  -  2.5      Grade  -  1.5      Minutes  -  15      METs  -  3.5        Recumbant Bike   Level  -  5      RPM  -  60      Watts  -  18      Minutes  -  15      METs  -  2.6        Elliptical   Level  -  1      Speed  -  3      Minutes  -  15         Exercise Comments: Exercise Comments    Row Name 12/01/17 1646           Exercise Comments  First full day of exercise!  Patient was oriented to gym and equipment including functions, settings, policies, and procedures.  Patient's individual exercise prescription and treatment plan were reviewed.  All starting workloads were established based on the results of the 6 minute walk test done at initial orientation visit.  The plan for exercise progression was also introduced and progression will be customized based on patient's performance and goals.          Exercise Goals and Review: Exercise Goals    Row Name 11/25/17 1452             Exercise Goals   Increase Physical Activity  Yes       Intervention  Provide advice, education, support and counseling about physical activity/exercise needs.;Develop an individualized exercise prescription for aerobic and resistive training based on initial evaluation findings, risk stratification, comorbidities and participant's personal goals.       Expected Outcomes  Achievement of increased cardiorespiratory fitness and enhanced flexibility, muscular endurance and strength shown through measurements of functional capacity and personal statement  of participant.       Increase Strength and Stamina  Yes       Intervention  Provide advice, education, support and counseling about physical activity/exercise needs.;Develop an individualized exercise prescription for aerobic and resistive training based on initial evaluation findings, risk stratification, comorbidities and participant's personal goals.       Expected Outcomes  Achievement of increased cardiorespiratory fitness and enhanced flexibility,  muscular endurance and strength shown through measurements of functional capacity and personal statement of participant.       Able to understand and use rate of perceived exertion (RPE) scale  Yes       Intervention  Provide education and explanation on how to use RPE scale       Expected Outcomes  Short Term: Able to use RPE daily in rehab to express subjective intensity level;Long Term:  Able to use RPE to guide intensity level when exercising independently       Able to understand and use Dyspnea scale  Yes       Intervention  Provide education and explanation on how to use Dyspnea scale       Expected Outcomes  Short Term: Able to use Dyspnea scale daily in rehab to express subjective sense of shortness of breath during exertion;Long Term: Able to use Dyspnea scale to guide intensity level when exercising independently       Knowledge and understanding of Target Heart Rate Range (THRR)  Yes       Intervention  Provide education and explanation of THRR including how the numbers were predicted and where they are located for reference       Expected Outcomes  Short Term: Able to state/look up THRR;Short Term: Able to use daily as guideline for intensity in rehab;Long Term: Able to use THRR to govern intensity when exercising independently       Able to check pulse independently  Yes       Intervention  Provide education and demonstration on how to check pulse in carotid and radial arteries.;Review the importance of being able to check your own pulse  for safety during independent exercise       Expected Outcomes  Short Term: Able to explain why pulse checking is important during independent exercise;Long Term: Able to check pulse independently and accurately       Understanding of Exercise Prescription  Yes       Intervention  Provide education, explanation, and written materials on patient's individual exercise prescription       Expected Outcomes  Short Term: Able to explain program exercise prescription;Long Term: Able to explain home exercise prescription to exercise independently          Exercise Goals Re-Evaluation : Exercise Goals Re-Evaluation    Row Name 12/01/17 1625 12/10/17 1208           Exercise Goal Re-Evaluation   Exercise Goals Review  Able to understand and use rate of perceived exertion (RPE) scale;Increase Strength and Stamina;Increase Physical Activity;Understanding of Exercise Prescription  Increase Physical Activity;Increase Strength and Stamina;Able to understand and use rate of perceived exertion (RPE) scale      Comments  Reviewed RPE scale, THR and program prescription with pt today.  Pt voiced understanding and was given a copy of goals to take home.   Edina is doing well in her first sessions.  She found the elliptical challenging, however, is motivated to work towards getting more endurance.      Expected Outcomes  Short: Use RPE daily to regulate intensity.  Long: Follow program prescription in THR.  Short - Mollyann will attend regularly [place] will be able to do 15 continuous minutes on elliptical         Discharge Exercise Prescription (Final Exercise Prescription Changes): Exercise Prescription Changes - 12/10/17 1100      Response to Exercise   Blood Pressure (Admit)  110/62  Blood Pressure (Exercise)  122/62    Blood Pressure (Exit)  112/60    Heart Rate (Admit)  71 bpm    Heart Rate (Exercise)  144 bpm    Heart Rate (Exit)  101 bpm    Rating of Perceived Exertion (Exercise)  15     Symptoms  none    Comments  ellipical difficult    Duration  Progress to 45 minutes of aerobic exercise without signs/symptoms of physical distress    Intensity  THRR unchanged      Progression   Progression  Continue to progress workloads to maintain intensity without signs/symptoms of physical distress.      Treadmill   MPH  2.5    Grade  1.5    Minutes  15    METs  3.5      Recumbant Bike   Level  5    RPM  60    Watts  18    Minutes  15    METs  2.6      Elliptical   Level  1    Speed  3    Minutes  15       Nutrition:  Target Goals: Understanding of nutrition guidelines, daily intake of sodium <1566m, cholesterol <2087m calories 30% from fat and 7% or less from saturated fats, daily to have 5 or more servings of fruits and vegetables.  Biometrics: Pre Biometrics - 11/25/17 1451      Pre Biometrics   Height  '5\' 2"'  (1.575 m)    Weight  182 lb 14.4 oz (83 kg)    Waist Circumference  38 inches    Hip Circumference  46 inches    Waist to Hip Ratio  0.83 %    BMI (Calculated)  33.44    Single Leg Stand  5.19 seconds        Nutrition Therapy Plan and Nutrition Goals:   Nutrition Assessments: Nutrition Assessments - 11/25/17 1331      MEDFICTS Scores   Pre Score  65       Nutrition Goals Re-Evaluation:   Nutrition Goals Discharge (Final Nutrition Goals Re-Evaluation):   Psychosocial: Target Goals: Acknowledge presence or absence of significant depression and/or stress, maximize coping skills, provide positive support system. Participant is able to verbalize types and ability to use techniques and skills needed for reducing stress and depression.   Initial Review & Psychosocial Screening: Initial Psych Review & Screening - 11/25/17 1344      Initial Review   Current issues with  Current Depression;Current Anxiety/Panic;Current Stress Concerns    Source of Stress Concerns  Financial    Comments  She is returning to work from being on FMLA due to  her STEMI and taking care of her dad who passed away. He was moved to hospice the same day she had her STEMI. She is playing "catch up" on her bills now that she has returned to work. She has struggled with depression in the past and currently, she is getting her meds adjusted.       Family Dynamics   Good Support System?  Yes Long time boyfriend and children      Screening Interventions   Interventions  Yes;Encouraged to exercise;Program counselor consult    Expected Outcomes  Short Term goal: Utilizing psychosocial counselor, staff and physician to assist with identification of specific Stressors or current issues interfering with healing process. Setting desired goal for each stressor or current issue identified.;Long Term Goal: Stressors  or current issues are controlled or eliminated.;Short Term goal: Identification and review with participant of any Quality of Life or Depression concerns found by scoring the questionnaire.;Long Term goal: The participant improves quality of Life and PHQ9 Scores as seen by post scores and/or verbalization of changes       Quality of Life Scores:  Quality of Life - 11/25/17 1350      Quality of Life Scores   Health/Function Pre  20.53 %    Socioeconomic Pre  20.5 %    Psych/Spiritual Pre  20.29 %    Family Pre  20.6 %    GLOBAL Pre  20.49 %      Scores of 19 and below usually indicate a poorer quality of life in these areas.  A difference of  2-3 points is a clinically meaningful difference.  A difference of 2-3 points in the total score of the Quality of Life Index has been associated with significant improvement in overall quality of life, self-image, physical symptoms, and general health in studies assessing change in quality of life.  PHQ-9: Recent Review Flowsheet Data    Depression screen [facility] 2/9 11/25/2017 01/20/2016 12/12/2015 10/14/2015   Decreased Interest 2 0 0 0   Down, Depressed, Hopeless 3 0 0 0   PHQ - 2 Score 5 0 0 0   Altered sleeping  1 - - -   Tired, decreased energy 3 - - -   Change in appetite 3 - - -   Feeling bad or failure about yourself  0 - - -   Trouble concentrating 1 - - -   Moving slowly or fidgety/restless 0 - - -   Suicidal thoughts 0 - - -   PHQ-9 Score 13 - - -   Difficult doing work/chores Somewhat difficult - - -     Interpretation of Total Score  Total Score Depression Severity:  1-4 = Minimal depression, 5-9 = Mild depression, 10-14 = Moderate depression, 15-19 = Moderately severe depression, 20-27 = Severe depression   Psychosocial Evaluation and Intervention:   Psychosocial Re-Evaluation:   Psychosocial Discharge (Final Psychosocial Re-Evaluation):   Vocational Rehabilitation: Provide vocational rehab assistance to qualifying candidates.   Vocational Rehab Evaluation & Intervention:   Education: Education Goals: Education classes will be provided on a variety of topics geared toward better understanding of heart health and risk factor modification. Participant will state understanding/return demonstration of topics presented as noted by education test scores.  Learning Barriers/Preferences: Learning Barriers/Preferences - 11/25/17 1352      Learning Barriers/Preferences   Learning Barriers  None    Learning Preferences  Group Instruction;Written Material       Education Topics:  AED/CPR: - Group verbal and written instruction with the use of models to demonstrate the basic use of the AED with the basic ABC's of resuscitation.   General Nutrition Guidelines/Fats and Fiber: -Group instruction provided by verbal, written material, models and posters to present the general guidelines for heart healthy nutrition. Gives an explanation and review of dietary fats and fiber.   Controlling Sodium/Reading Food Labels: -Group verbal and written material supporting the discussion of sodium use in heart healthy nutrition. Review and explanation with models, verbal and written materials  for utilization of the food label.   Exercise Physiology & General Exercise Guidelines: - Group verbal and written instruction with models to review the exercise physiology of the cardiovascular system and associated critical values. Provides general exercise guidelines with specific guidelines to those with  heart or lung disease.    Aerobic Exercise & Resistance Training: - Gives group verbal and written instruction on the various components of exercise. Focuses on aerobic and resistive training programs and the benefits of this training and how to safely progress through these programs..   Flexibility, Balance, Mind/Body Relaxation: Provides group verbal/written instruction on the benefits of flexibility and balance training, including mind/body exercise modes such as yoga, pilates and tai chi.  Demonstration and skill practice provided.   Stress and Anxiety: - Provides group verbal and written instruction about the health risks of elevated stress and causes of high stress.  Discuss the correlation between heart/lung disease and anxiety and treatment options. Review healthy ways to manage with stress and anxiety.   Depression: - Provides group verbal and written instruction on the correlation between heart/lung disease and depressed mood, treatment options, and the stigmas associated with seeking treatment.   Anatomy & Physiology of the Heart: - Group verbal and written instruction and models provide basic cardiac anatomy and physiology, with the coronary electrical and arterial systems. Review of Valvular disease and Heart Failure   Cardiac Procedures: - Group verbal and written instruction to review commonly prescribed medications for heart disease. Reviews the medication, class of the drug, and side effects. Includes the steps to properly store meds and maintain the prescription regimen. (beta blockers and nitrates)   Cardiac Medications I: - Group verbal and written instruction  to review commonly prescribed medications for heart disease. Reviews the medication, class of the drug, and side effects. Includes the steps to properly store meds and maintain the prescription regimen.   Cardiac Medications II: -Group verbal and written instruction to review commonly prescribed medications for heart disease. Reviews the medication, class of the drug, and side effects. (all other drug classes)    Go Sex-Intimacy & Heart Disease, Get SMART - Goal Setting: - Group verbal and written instruction through game format to discuss heart disease and the return to sexual intimacy. Provides group verbal and written material to discuss and apply goal setting through the application of the S.M.A.R.T. Method.   Other Matters of the Heart: - Provides group verbal, written materials and models to describe Stable Angina and Peripheral Artery. Includes description of the disease process and treatment options available to the cardiac patient.   Exercise & Equipment Safety: - Individual verbal instruction and demonstration of equipment use and safety with use of the equipment.   Cardiac Rehab from 11/25/2017 in [facility] Cardiac and Pulmonary Rehab  Date  11/25/17  Educator  mc  Instruction Review Code  1- Verbalizes Understanding      Infection Prevention: - Provides verbal and written material to individual with discussion of infection control including proper hand washing and proper equipment cleaning during exercise session.   Cardiac Rehab from 11/25/2017 in [facility] Cardiac and Pulmonary Rehab  Date  11/25/17  Educator  mc  Instruction Review Code  1- Verbalizes Understanding      Falls Prevention: - Provides verbal and written material to individual with discussion of falls prevention and safety.   Cardiac Rehab from 11/25/2017 in [facility] Cardiac and Pulmonary Rehab  Date  11/25/17  Educator  mc  Instruction Review Code  1- Verbalizes Understanding      Diabetes: - Individual  verbal and written instruction to review signs/symptoms of diabetes, desired ranges of glucose level fasting, after meals and with exercise. Acknowledge that pre and post exercise glucose checks will be done for 3 sessions at entry of  program.   Know Your Numbers and Risk Factors: -Group verbal and written instruction about important numbers in your health.  Discussion of what are risk factors and how they play a role in the disease process.  Review of Cholesterol, Blood Pressure, Diabetes, and BMI and the role they play in your overall health.   Sleep Hygiene: -Provides group verbal and written instruction about how sleep can affect your health.  Define sleep hygiene, discuss sleep cycles and impact of sleep habits. Review good sleep hygiene tips.    Other: -Provides group and verbal instruction on various topics (see comments)   Knowledge Questionnaire Score: Knowledge Questionnaire Score - 11/25/17 1352      Knowledge Questionnaire Score   Pre Score  25/28 correct answers reviewed with Terea       Core Components/Risk Factors/Patient Goals at Admission: Personal Goals and Risk Factors at Admission - 11/25/17 1326      Core Components/Risk Factors/Patient Goals on Admission    Weight Management  Obesity;Weight Loss;Yes    Intervention  Weight Management: Develop a combined nutrition and exercise program designed to reach desired caloric intake, while maintaining appropriate intake of nutrient and fiber, sodium and fats, and appropriate energy expenditure required for the weight goal.;Obesity: Provide education and appropriate resources to help participant work on and attain dietary goals.;Weight Management/Obesity: Establish reasonable short term and long term weight goals.;Weight Management: Provide education and appropriate resources to help participant work on and attain dietary goals.    Admit Weight  182 lb 14.4 oz (83 kg)    Goal Weight: Short Term  178 lb (80.7 kg)    Goal  Weight: Long Term  135 lb (61.2 kg)    Expected Outcomes  Short Term: Continue to assess and modify interventions until short term weight is achieved;Long Term: Adherence to nutrition and physical activity/exercise program aimed toward attainment of established weight goal;Weight Loss: Understanding of general recommendations for a balanced deficit meal plan, which promotes 1-2 lb weight loss per week and includes a negative energy balance of [phone] kcal/d;Understanding recommendations for meals to include 15-35% energy as protein, 25-35% energy from fat, 35-60% energy from carbohydrates, less than 276m of dietary cholesterol, 20-35 gm of total fiber daily;Understanding of distribution of calorie intake throughout the day with the consumption of 4-5 meals/snacks    Lipids  Yes    Intervention  Provide education and support for participant on nutrition & aerobic/resistive exercise along with prescribed medications to achieve LDL <785m HDL >4071m   Expected Outcomes  Short Term: Participant states understanding of desired cholesterol values and is compliant with medications prescribed. Participant is following exercise prescription and nutrition guidelines.;Long Term: Cholesterol controlled with medications as prescribed, with individualized exercise RX and with personalized nutrition plan. Value goals: LDL < 83m39mDL > 40 mg.    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 Teach and support self-help strategies.;Refer participants experiencing significant psychosocial distress to appropriate mental health specialists for further evaluation and treatment. When possible, include family members and significant others in education/counseling sessions.    Expected Outcomes  Short Term:  Participant demonstrates changes in health-related behavior, relaxation and other stress management skills, ability to obtain effective social support, and compliance with psychotropic medications if prescribed.;Long Term: Emotional wellbeing is  indicated by absence of clinically significant psychosocial distress or social isolation.       Core Components/Risk Factors/Patient Goals Review:    Core Components/Risk Factors/Patient Goals at Discharge (Final Review):    ITP Comments: ITP Comments    Row Name 11/25/17 1322 12/01/17 0628 12/24/17 1027 12/29/17 1024 01/26/18 0630   ITP Comments  Med review completed. Initial ITP created. Diagnosis can be found in [facility] 10/26/17  30 day review. Continue with ITP unless directed changes per Medical Director review.   New to program  Andriea has been out since 12/6  due to the weather and moving and plans to return after the first of the year.  30 day review. Continue with ITP unless directed changes per Medical Director review.   2 visits this month  30 Day review. Continue with ITP unless directed changes per Medical Director review.  no visits in Jan      Comments:

## 2018-01-27 ENCOUNTER — Telehealth: Payer: Self-pay

## 2018-01-27 NOTE — Progress Notes (Signed)
Discharge Progress Report  Patient Details  Name: Angela Mccullough MRN: [id number] Date of Birth: 1966/03/29 Referring Provider:     Cardiac Rehab from 11/25/2017 in [facility]  Referring Provider  [name]       Number of Visits: 3  Reason for Discharge:  Early Exit:  Personal  Smoking History:  Social History   Tobacco Use  Smoking Status Current Every Day Smoker  . Packs/day: 0.50  . Types: Cigarettes  Smokeless Tobacco Current User  Tobacco Comment   Cutting down to five cigs a day 11/25/17    Diagnosis:  No diagnosis found.  ADL [facility]:   Initial Exercise Prescription: Initial Exercise Prescription - 11/25/17 1400      Date of Initial Exercise RX and Referring Provider   Date  11/25/17    Referring Provider  [name]      Treadmill   MPH  2.5    Grade  1.5    Minutes  15    METs  3.5      Recumbant Bike   Level  5    RPM  60    Watts  45    Minutes  15    METs  3.6      NuStep   Level  4    SPM  80    Minutes  15    METs  3.5      Elliptical   Level  1    Speed  2.5    Minutes  15    METs  3.5      Prescription Details   Frequency (times per week)  3    Duration  Progress to 45 minutes of aerobic exercise without signs/symptoms of physical distress      Intensity   THRR 40-80% of Max Heartrate  108-149    Ratings of Perceived Exertion  11-13    Perceived Dyspnea  0-4      Resistance Training   Training Prescription  Yes    Weight  3 lb    Reps  10-15       Discharge Exercise Prescription (Final Exercise Prescription Changes): Exercise Prescription Changes - 12/10/17 1100      Response to Exercise   Blood Pressure (Admit)  110/62    Blood Pressure (Exercise)  122/62    Blood Pressure (Exit)  112/60    Heart Rate (Admit)  71 bpm    Heart Rate (Exercise)  144 bpm    Heart Rate (Exit)  101 bpm    Rating of Perceived Exertion (Exercise)  15    Symptoms  none    Comments  ellipical difficult    Duration  Progress to 45 minutes of aerobic exercise without signs/symptoms of physical distress    Intensity  THRR unchanged      Progression   Progression  Continue to progress workloads to maintain intensity without signs/symptoms of physical distress.      Treadmill   MPH  2.5    Grade  1.5    Minutes  15    METs  3.5      Recumbant Bike   Level  5    RPM  60    Watts  18    Minutes  15    METs  2.6      Elliptical   Level  1    Speed  3    Minutes  15       Functional  Capacity: 6 Minute Walk    Row Name 11/25/17 1452         6 Minute Walk   Distance  1350 feet     Walk Time  6 minutes     # of Rest Breaks  0     MPH  2.55     METS  3.68     RPE  12     Perceived Dyspnea   1     VO2 Peak  12.89     Symptoms  No     Resting HR  68 bpm     Resting BP  126/70     Resting Oxygen Saturation   97 %     Exercise Oxygen Saturation  during 6 min walk  98 %     Max Ex. HR  104 bpm     Max Ex. BP  122/60     2 Minute Post BP  106/58        Psychological, QOL, Others - Outcomes: PHQ 2/9: Depression screen [facility] 2/9 11/25/2017 01/20/2016 12/12/2015 10/14/2015  Decreased Interest 2 0 0 0  Down, Depressed, Hopeless 3 0 0 0  PHQ - 2 Score 5 0 0 0  Altered sleeping 1 - - -  Tired, decreased energy 3 - - -  Change in appetite 3 - - -  Feeling bad or failure about yourself  0 - - -  Trouble concentrating 1 - - -  Moving slowly or fidgety/restless 0 - - -  Suicidal thoughts 0 - - -  PHQ-9 Score 13 - - -  Difficult doing work/chores Somewhat difficult - - -    Quality of Life: Quality of Life - 11/25/17 1350      Quality of Life Scores   Health/Function Pre  20.53 %    Socioeconomic Pre  20.5 %    Psych/Spiritual Pre  20.29 %    Family Pre  20.6 %    GLOBAL Pre  20.49 %       Personal Goals: Goals established at orientation with interventions provided to work toward goal. Personal Goals and Risk Factors at Admission - 11/25/17 1326      Core  Components/Risk Factors/Patient Goals on Admission    Weight Management  Obesity;Weight Loss;Yes    Intervention  Weight Management: Develop a combined nutrition and exercise program designed to reach desired caloric intake, while maintaining appropriate intake of nutrient and fiber, sodium and fats, and appropriate energy expenditure required for the weight goal.;Obesity: Provide education and appropriate resources to help participant work on and attain dietary goals.;Weight Management/Obesity: Establish reasonable short term and long term weight goals.;Weight Management: Provide education and appropriate resources to help participant work on and attain dietary goals.    Admit Weight  182 lb 14.4 oz (83 kg)    Goal Weight: Short Term  178 lb (80.7 kg)    Goal Weight: Long Term  135 lb (61.2 kg)    Expected Outcomes  Short Term: Continue to assess and modify interventions until short term weight is achieved;Long Term: Adherence to nutrition and physical activity/exercise program aimed toward attainment of established weight goal;Weight Loss: Understanding of general recommendations for a balanced deficit meal plan, which promotes 1-2 lb weight loss per week and includes a negative energy balance of [phone] kcal/d;Understanding recommendations for meals to include 15-35% energy as protein, 25-35% energy from fat, 35-60% energy from carbohydrates, less than 200mg  of dietary cholesterol, 20-35 gm of total fiber daily;Understanding  of distribution of calorie intake throughout the day with the consumption of 4-5 meals/snacks    Lipids  Yes    Intervention  Provide education and support for participant on nutrition & aerobic/resistive exercise along with prescribed medications to achieve LDL 70mg , HDL >40mg .    Expected Outcomes  Short Term: Participant states understanding of desired cholesterol values and is compliant with medications prescribed. Participant is following exercise prescription and nutrition  guidelines.;Long Term: Cholesterol controlled with medications as prescribed, with individualized exercise RX and with personalized nutrition plan. Value goals: LDL < 70mg , HDL > 40 mg.    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 Teach and support self-help strategies.;Refer participants experiencing significant psychosocial distress to appropriate mental health specialists for further evaluation and treatment. When possible, include family members and significant others in education/counseling sessions.    Expected Outcomes  Short Term: Participant demonstrates changes in health-related behavior, relaxation and other stress management skills, ability to obtain effective social support, and compliance with psychotropic medications if prescribed.;Long Term: Emotional wellbeing is indicated by absence of clinically significant psychosocial distress or social isolation.        Personal Goals Discharge:   Exercise Goals and Review: Exercise Goals    Row Name 11/25/17 1452             Exercise Goals   Increase Physical Activity  Yes       Intervention  Provide advice, education, support and counseling about physical activity/exercise needs.;Develop an individualized exercise prescription for aerobic and resistive training based on initial evaluation findings, risk stratification, comorbidities and participant's personal goals.       Expected Outcomes  Achievement of increased cardiorespiratory fitness and enhanced flexibility, muscular endurance and strength shown through measurements of functional capacity and personal statement of participant.       Increase Strength and Stamina  Yes       Intervention  Provide advice, education, support and counseling about physical  activity/exercise needs.;Develop an individualized exercise prescription for aerobic and resistive training based on initial evaluation findings, risk stratification, comorbidities and participant's personal goals.       Expected Outcomes  Achievement of increased cardiorespiratory fitness and enhanced flexibility, muscular endurance and strength shown through measurements of functional capacity and personal statement of participant.       Able to understand and use rate of perceived exertion (RPE) scale  Yes       Intervention  Provide education and explanation on how to use RPE scale       Expected Outcomes  Short Term: Able to use RPE daily in rehab to express subjective intensity level;Long Term:  Able to use RPE to guide intensity level when exercising independently       Able to understand and use Dyspnea scale  Yes       Intervention  Provide education and explanation on how to use Dyspnea scale       Expected Outcomes  Short Term: Able to use Dyspnea scale daily in rehab to express subjective sense of shortness of breath during exertion;Long Term: Able to use Dyspnea scale to guide intensity level when exercising independently       Knowledge and understanding of Target Heart Rate Range (THRR)  Yes  Intervention  Provide education and explanation of THRR including how the numbers were predicted and where they are located for reference       Expected Outcomes  Short Term: Able to state/look up THRR;Short Term: Able to use daily as guideline for intensity in rehab;Long Term: Able to use THRR to govern intensity when exercising independently       Able to check pulse independently  Yes       Intervention  Provide education and demonstration on how to check pulse in carotid and radial arteries.;Review the importance of being able to check your own pulse for safety during independent exercise       Expected Outcomes  Short Term: Able to explain why pulse checking is important during independent  exercise;Long Term: Able to check pulse independently and accurately       Understanding of Exercise Prescription  Yes       Intervention  Provide education, explanation, and written materials on patient's individual exercise prescription       Expected Outcomes  Short Term: Able to explain program exercise prescription;Long Term: Able to explain home exercise prescription to exercise independently          Nutrition & Weight - Outcomes: Pre Biometrics - 11/25/17 1451      Pre Biometrics   Height  5\' 2"  (1.575 m)    Weight  182 lb 14.4 oz (83 kg)    Waist Circumference  38 inches    Hip Circumference  46 inches    Waist to Hip Ratio  0.83 %    BMI (Calculated)  33.44    Single Leg Stand  5.19 seconds        Nutrition:   Nutrition Discharge: Nutrition Assessments - 11/25/17 1331      MEDFICTS Scores   Pre Score  65       Education Questionnaire Score: Knowledge Questionnaire Score - 11/25/17 1352      Knowledge Questionnaire Score   Pre Score  25/28 correct answers reviewed with Anne Ng       Goals reviewed with patient; copy given to patient.

## 2018-01-27 NOTE — Telephone Encounter (Signed)
Spoke with Anyeli- she will discharge for now - see Dr [name] in march and will get another referral to start HT at that time

## 2018-01-27 NOTE — Progress Notes (Signed)
Cardiac Individual Treatment Plan  Patient Details  Name: Angela Mccullough MRN: [id number] Date of Birth: 13-Aug-1966 Referring Provider:     Cardiac Rehab from 11/25/2017 in [facility] Cardiac and Pulmonary Rehab  Referring Provider  [name]      Initial Encounter Date:    Cardiac Rehab from 11/25/2017 in [facility] Cardiac and Pulmonary Rehab  Date  11/25/17  Referring Provider  [name]      Visit Diagnosis: No diagnosis found.  Patient's Home Medications on Admission:  Current Outpatient Medications:  .  albuterol (PROVENTIL HFA;VENTOLIN HFA) 108 (90 BASE) MCG/ACT inhaler, Inhale 2 puffs into the lungs every 6 (six) hours as needed for wheezing or shortness of breath. (Patient not taking: Reported on 11/25/2017), Disp: 1 Inhaler, Rfl: 12 .  aspirin 81 MG chewable tablet, Chew 1 tablet (81 mg total) by mouth daily., Disp: 30 tablet, Rfl: 1 .  atorvastatin (LIPITOR) 80 MG tablet, Take 1 tablet (80 mg total) by mouth daily at 6 PM., Disp: 30 tablet, Rfl: 1 .  baclofen (LIORESAL) 10 MG tablet, Take 0.5-1 tablets (5-10 mg total) by mouth 3 (three) times daily as needed for muscle spasms., Disp: 30 each, Rfl: 1 .  carvedilol (COREG) 3.125 MG tablet, Take 1 tablet (3.125 mg total) by mouth 2 (two) times daily with a meal., Disp: 60 tablet, Rfl: 1 .  cetirizine (ZYRTEC) 10 MG tablet, Take 10 mg by mouth daily., Disp: , Rfl:  .  Cholecalciferol 2000 units CAPS, Take 1 capsule (2,000 Units total) by mouth daily. (Patient not taking: Reported on 11/25/2017), Disp: 30 each, Rfl:  .  citalopram (CELEXA) 20 MG tablet, Take 20 mg by mouth daily., Disp: , Rfl:  .  citalopram (CELEXA) 40 MG tablet, , Disp: , Rfl:  .  clopidogrel (PLAVIX) 75 MG tablet, Take 1 tablet (75 mg total) by mouth daily with breakfast., Disp: 30 tablet, Rfl: 1 .  diphenhydrAMINE (BENADRYL) 25 MG tablet, Take 25 mg by mouth every 4 (four) hours as needed., Disp: , Rfl:  .  fluticasone (FLOVENT HFA) 110 MCG/ACT inhaler, Inhale 1 puff  into the lungs 2 (two) times daily. (Patient not taking: Reported on 11/25/2017), Disp: 1 Inhaler, Rfl: 12 .  mometasone (NASONEX) 50 MCG/ACT nasal spray, Place 2 sprays into the nose daily. (Patient not taking: Reported on 11/25/2017), Disp: 17 g, Rfl: 12 .  Psyllium 28.3 % POWD, Take 500 mg by mouth daily. (Patient not taking: Reported on 10/28/2017), Disp: 283 g, Rfl:   Past Medical History: Past Medical History:  Diagnosis Date  . Anxiety and depression   . ASD (atrial septal defect), common atrium (single atrium)   . Heart murmur   . Heartburn   . Kidney stone     Tobacco Use: Social History   Tobacco Use  Smoking Status Current Every Day Smoker  . Packs/day: 0.50  . Types: Cigarettes  Smokeless Tobacco Current User  Tobacco Comment   Cutting down to five cigs a day 11/25/17    Labs: Recent Review Flowsheet Data    Labs for ITP Cardiac and Pulmonary Rehab Latest Ref Rng & Units 07/09/2009 11/26/2015 10/26/2017   Cholestrol 0 - 200 mg/dL - 214(H) 213(H)   LDLCALC 0 - 99 mg/dL - 128(H) 130(H)   LDLDIRECT mg/dL 130.4 - -   HDL >40 mg/dL - 63 49   Trlycerides <150 mg/dL - 115 172(H)   Hemoglobin A1c 4.8 - 5.6 % - - 5.4       Exercise Target  Goals:    Exercise Program Goal: Individual exercise prescription set using results from initial 6 min walk test and THRR while considering  patient's activity barriers and safety.   Exercise Prescription Goal: Initial exercise prescription builds to 30-45 minutes a day of aerobic activity, 2-3 days per week.  Home exercise guidelines will be given to patient during program as part of exercise prescription that the participant will acknowledge.  Activity Barriers & Risk Stratification: Activity Barriers & Cardiac Risk Stratification - 11/25/17 1355      Activity Barriers & Cardiac Risk Stratification   Activity Barriers  None    Cardiac Risk Stratification  Moderate       6 Minute Walk: 6 Minute Walk    Row Name 11/25/17  1452         6 Minute Walk   Distance  1350 feet     Walk Time  6 minutes     # of Rest Breaks  0     MPH  2.55     METS  3.68     RPE  12     Perceived Dyspnea   1     VO2 Peak  12.89     Symptoms  No     Resting HR  68 bpm     Resting BP  126/70     Resting Oxygen Saturation   97 %     Exercise Oxygen Saturation  during 6 min walk  98 %     Max Ex. HR  104 bpm     Max Ex. BP  122/60     2 Minute Post BP  106/58        Oxygen Initial Assessment:   Oxygen Re-Evaluation:   Oxygen Discharge (Final Oxygen Re-Evaluation):   Initial Exercise Prescription: Initial Exercise Prescription - 11/25/17 1400      Date of Initial Exercise RX and Referring Provider   Date  11/25/17    Referring Provider  [name]      Treadmill   MPH  2.5    Grade  1.5    Minutes  15    METs  3.5      Recumbant Bike   Level  5    RPM  60    Watts  45    Minutes  15    METs  3.6      NuStep   Level  4    SPM  80    Minutes  15    METs  3.5      Elliptical   Level  1    Speed  2.5    Minutes  15    METs  3.5      Prescription Details   Frequency (times per week)  3    Duration  Progress to 45 minutes of aerobic exercise without signs/symptoms of physical distress      Intensity   THRR 40-80% of Max Heartrate  108-149    Ratings of Perceived Exertion  11-13    Perceived Dyspnea  0-4      Resistance Training   Training Prescription  Yes    Weight  3 lb    Reps  10-15       Perform Capillary Blood Glucose checks as needed.  Exercise Prescription Changes: Exercise Prescription Changes    Row Name 11/25/17 1300 12/10/17 1100           Response to Exercise   Blood Pressure (Admit)  126/70  110/62      Blood Pressure (Exercise)  122/66  122/62      Blood Pressure (Exit)  106/58  112/60      Heart Rate (Admit)  81 bpm  71 bpm      Heart Rate (Exercise)  104 bpm  144 bpm      Heart Rate (Exit)  84 bpm  101 bpm      Oxygen Saturation (Admit)  97 %  -      Rating  of Perceived Exertion (Exercise)  12  15      Symptoms  -  none      Comments  -  ellipical difficult      Duration  -  Progress to 45 minutes of aerobic exercise without signs/symptoms of physical distress      Intensity  -  THRR unchanged        Progression   Progression  -  Continue to progress workloads to maintain intensity without signs/symptoms of physical distress.        Treadmill   MPH  -  2.5      Grade  -  1.5      Minutes  -  15      METs  -  3.5        Recumbant Bike   Level  -  5      RPM  -  60      Watts  -  18      Minutes  -  15      METs  -  2.6        Elliptical   Level  -  1      Speed  -  3      Minutes  -  15         Exercise Comments: Exercise Comments    Row Name 12/01/17 1646           Exercise Comments  First full day of exercise!  Patient was oriented to gym and equipment including functions, settings, policies, and procedures.  Patient's individual exercise prescription and treatment plan were reviewed.  All starting workloads were established based on the results of the 6 minute walk test done at initial orientation visit.  The plan for exercise progression was also introduced and progression will be customized based on patient's performance and goals.          Exercise Goals and Review: Exercise Goals    Row Name 11/25/17 1452             Exercise Goals   Increase Physical Activity  Yes       Intervention  Provide advice, education, support and counseling about physical activity/exercise needs.;Develop an individualized exercise prescription for aerobic and resistive training based on initial evaluation findings, risk stratification, comorbidities and participant's personal goals.       Expected Outcomes  Achievement of increased cardiorespiratory fitness and enhanced flexibility, muscular endurance and strength shown through measurements of functional capacity and personal statement of participant.       Increase Strength and  Stamina  Yes       Intervention  Provide advice, education, support and counseling about physical activity/exercise needs.;Develop an individualized exercise prescription for aerobic and resistive training based on initial evaluation findings, risk stratification, comorbidities and participant's personal goals.       Expected Outcomes  Achievement of increased cardiorespiratory fitness and enhanced flexibility, muscular endurance and  strength shown through measurements of functional capacity and personal statement of participant.       Able to understand and use rate of perceived exertion (RPE) scale  Yes       Intervention  Provide education and explanation on how to use RPE scale       Expected Outcomes  Short Term: Able to use RPE daily in rehab to express subjective intensity level;Long Term:  Able to use RPE to guide intensity level when exercising independently       Able to understand and use Dyspnea scale  Yes       Intervention  Provide education and explanation on how to use Dyspnea scale       Expected Outcomes  Short Term: Able to use Dyspnea scale daily in rehab to express subjective sense of shortness of breath during exertion;Long Term: Able to use Dyspnea scale to guide intensity level when exercising independently       Knowledge and understanding of Target Heart Rate Range (THRR)  Yes       Intervention  Provide education and explanation of THRR including how the numbers were predicted and where they are located for reference       Expected Outcomes  Short Term: Able to state/look up THRR;Short Term: Able to use daily as guideline for intensity in rehab;Long Term: Able to use THRR to govern intensity when exercising independently       Able to check pulse independently  Yes       Intervention  Provide education and demonstration on how to check pulse in carotid and radial arteries.;Review the importance of being able to check your own pulse for safety during independent exercise        Expected Outcomes  Short Term: Able to explain why pulse checking is important during independent exercise;Long Term: Able to check pulse independently and accurately       Understanding of Exercise Prescription  Yes       Intervention  Provide education, explanation, and written materials on patient's individual exercise prescription       Expected Outcomes  Short Term: Able to explain program exercise prescription;Long Term: Able to explain home exercise prescription to exercise independently          Exercise Goals Re-Evaluation : Exercise Goals Re-Evaluation    Row Name 12/01/17 1625 12/10/17 1208           Exercise Goal Re-Evaluation   Exercise Goals Review  Able to understand and use rate of perceived exertion (RPE) scale;Increase Strength and Stamina;Increase Physical Activity;Understanding of Exercise Prescription  Increase Physical Activity;Increase Strength and Stamina;Able to understand and use rate of perceived exertion (RPE) scale      Comments  Reviewed RPE scale, THR and program prescription with pt today.  Pt voiced understanding and was given a copy of goals to take home.   Angela Mccullough is doing well in her first sessions.  She found the elliptical challenging, however, is motivated to work towards getting more endurance.      Expected Outcomes  Short: Use RPE daily to regulate intensity.  Long: Follow program prescription in THR.  Short - Angela Mccullough will attend regularly [place] will be able to do 15 continuous minutes on elliptical         Discharge Exercise Prescription (Final Exercise Prescription Changes): Exercise Prescription Changes - 12/10/17 1100      Response to Exercise   Blood Pressure (Admit)  110/62    Blood Pressure (  Exercise)  122/62    Blood Pressure (Exit)  112/60    Heart Rate (Admit)  71 bpm    Heart Rate (Exercise)  144 bpm    Heart Rate (Exit)  101 bpm    Rating of Perceived Exertion (Exercise)  15    Symptoms  none    Comments  ellipical  difficult    Duration  Progress to 45 minutes of aerobic exercise without signs/symptoms of physical distress    Intensity  THRR unchanged      Progression   Progression  Continue to progress workloads to maintain intensity without signs/symptoms of physical distress.      Treadmill   MPH  2.5    Grade  1.5    Minutes  15    METs  3.5      Recumbant Bike   Level  5    RPM  60    Watts  18    Minutes  15    METs  2.6      Elliptical   Level  1    Speed  3    Minutes  15       Nutrition:  Target Goals: Understanding of nutrition guidelines, daily intake of sodium <1532m, cholesterol <2060m calories 30% from fat and 7% or less from saturated fats, daily to have 5 or more servings of fruits and vegetables.  Biometrics: Pre Biometrics - 11/25/17 1451      Pre Biometrics   Height  '5\' 2"'  (1.575 m)    Weight  182 lb 14.4 oz (83 kg)    Waist Circumference  38 inches    Hip Circumference  46 inches    Waist to Hip Ratio  0.83 %    BMI (Calculated)  33.44    Single Leg Stand  5.19 seconds        Nutrition Therapy Plan and Nutrition Goals:   Nutrition Assessments: Nutrition Assessments - 11/25/17 1331      MEDFICTS Scores   Pre Score  65       Nutrition Goals Re-Evaluation:   Nutrition Goals Discharge (Final Nutrition Goals Re-Evaluation):   Psychosocial: Target Goals: Acknowledge presence or absence of significant depression and/or stress, maximize coping skills, provide positive support system. Participant is able to verbalize types and ability to use techniques and skills needed for reducing stress and depression.   Initial Review & Psychosocial Screening: Initial Psych Review & Screening - 11/25/17 1344      Initial Review   Current issues with  Current Depression;Current Anxiety/Panic;Current Stress Concerns    Source of Stress Concerns  Financial    Comments  She is returning to work from being on FMLA due to her STEMI and taking care of her dad who  passed away. He was moved to hospice the same day she had her STEMI. She is playing "catch up" on her bills now that she has returned to work. She has struggled with depression in the past and currently, she is getting her meds adjusted.       Family Dynamics   Good Support System?  Yes Long time boyfriend and children      Screening Interventions   Interventions  Yes;Encouraged to exercise;Program counselor consult    Expected Outcomes  Short Term goal: Utilizing psychosocial counselor, staff and physician to assist with identification of specific Stressors or current issues interfering with healing process. Setting desired goal for each stressor or current issue identified.;Long Term Goal: Stressors or current  issues are controlled or eliminated.;Short Term goal: Identification and review with participant of any Quality of Life or Depression concerns found by scoring the questionnaire.;Long Term goal: The participant improves quality of Life and PHQ9 Scores as seen by post scores and/or verbalization of changes       Quality of Life Scores:  Quality of Life - 11/25/17 1350      Quality of Life Scores   Health/Function Pre  20.53 %    Socioeconomic Pre  20.5 %    Psych/Spiritual Pre  20.29 %    Family Pre  20.6 %    GLOBAL Pre  20.49 %      Scores of 19 and below usually indicate a poorer quality of life in these areas.  A difference of  2-3 points is a clinically meaningful difference.  A difference of 2-3 points in the total score of the Quality of Life Index has been associated with significant improvement in overall quality of life, self-image, physical symptoms, and general health in studies assessing change in quality of life.  PHQ-9: Recent Review Flowsheet Data    Depression screen [facility] 2/9 11/25/2017 01/20/2016 12/12/2015 10/14/2015   Decreased Interest 2 0 0 0   Down, Depressed, Hopeless 3 0 0 0   PHQ - 2 Score 5 0 0 0   Altered sleeping 1 - - -   Tired, decreased energy 3 - -  -   Change in appetite 3 - - -   Feeling bad or failure about yourself  0 - - -   Trouble concentrating 1 - - -   Moving slowly or fidgety/restless 0 - - -   Suicidal thoughts 0 - - -   PHQ-9 Score 13 - - -   Difficult doing work/chores Somewhat difficult - - -     Interpretation of Total Score  Total Score Depression Severity:  1-4 = Minimal depression, 5-9 = Mild depression, 10-14 = Moderate depression, 15-19 = Moderately severe depression, 20-27 = Severe depression   Psychosocial Evaluation and Intervention:   Psychosocial Re-Evaluation:   Psychosocial Discharge (Final Psychosocial Re-Evaluation):   Vocational Rehabilitation: Provide vocational rehab assistance to qualifying candidates.   Vocational Rehab Evaluation & Intervention:   Education: Education Goals: Education classes will be provided on a variety of topics geared toward better understanding of heart health and risk factor modification. Participant will state understanding/return demonstration of topics presented as noted by education test scores.  Learning Barriers/Preferences: Learning Barriers/Preferences - 11/25/17 1352      Learning Barriers/Preferences   Learning Barriers  None    Learning Preferences  Group Instruction;Written Material       Education Topics:  AED/CPR: - Group verbal and written instruction with the use of models to demonstrate the basic use of the AED with the basic ABC's of resuscitation.   General Nutrition Guidelines/Fats and Fiber: -Group instruction provided by verbal, written material, models and posters to present the general guidelines for heart healthy nutrition. Gives an explanation and review of dietary fats and fiber.   Controlling Sodium/Reading Food Labels: -Group verbal and written material supporting the discussion of sodium use in heart healthy nutrition. Review and explanation with models, verbal and written materials for utilization of the food  label.   Exercise Physiology & General Exercise Guidelines: - Group verbal and written instruction with models to review the exercise physiology of the cardiovascular system and associated critical values. Provides general exercise guidelines with specific guidelines to those with heart or  lung disease.    Aerobic Exercise & Resistance Training: - Gives group verbal and written instruction on the various components of exercise. Focuses on aerobic and resistive training programs and the benefits of this training and how to safely progress through these programs..   Flexibility, Balance, Mind/Body Relaxation: Provides group verbal/written instruction on the benefits of flexibility and balance training, including mind/body exercise modes such as yoga, pilates and tai chi.  Demonstration and skill practice provided.   Stress and Anxiety: - Provides group verbal and written instruction about the health risks of elevated stress and causes of high stress.  Discuss the correlation between heart/lung disease and anxiety and treatment options. Review healthy ways to manage with stress and anxiety.   Depression: - Provides group verbal and written instruction on the correlation between heart/lung disease and depressed mood, treatment options, and the stigmas associated with seeking treatment.   Anatomy & Physiology of the Heart: - Group verbal and written instruction and models provide basic cardiac anatomy and physiology, with the coronary electrical and arterial systems. Review of Valvular disease and Heart Failure   Cardiac Procedures: - Group verbal and written instruction to review commonly prescribed medications for heart disease. Reviews the medication, class of the drug, and side effects. Includes the steps to properly store meds and maintain the prescription regimen. (beta blockers and nitrates)   Cardiac Medications I: - Group verbal and written instruction to review commonly  prescribed medications for heart disease. Reviews the medication, class of the drug, and side effects. Includes the steps to properly store meds and maintain the prescription regimen.   Cardiac Medications II: -Group verbal and written instruction to review commonly prescribed medications for heart disease. Reviews the medication, class of the drug, and side effects. (all other drug classes)    Go Sex-Intimacy & Heart Disease, Get SMART - Goal Setting: - Group verbal and written instruction through game format to discuss heart disease and the return to sexual intimacy. Provides group verbal and written material to discuss and apply goal setting through the application of the S.M.A.R.T. Method.   Other Matters of the Heart: - Provides group verbal, written materials and models to describe Stable Angina and Peripheral Artery. Includes description of the disease process and treatment options available to the cardiac patient.   Exercise & Equipment Safety: - Individual verbal instruction and demonstration of equipment use and safety with use of the equipment.   Cardiac Rehab from 11/25/2017 in [facility] Cardiac and Pulmonary Rehab  Date  11/25/17  Educator  mc  Instruction Review Code  1- Verbalizes Understanding      Infection Prevention: - Provides verbal and written material to individual with discussion of infection control including proper hand washing and proper equipment cleaning during exercise session.   Cardiac Rehab from 11/25/2017 in [facility] Cardiac and Pulmonary Rehab  Date  11/25/17  Educator  mc  Instruction Review Code  1- Verbalizes Understanding      Falls Prevention: - Provides verbal and written material to individual with discussion of falls prevention and safety.   Cardiac Rehab from 11/25/2017 in [facility] Cardiac and Pulmonary Rehab  Date  11/25/17  Educator  mc  Instruction Review Code  1- Verbalizes Understanding      Diabetes: - Individual verbal and written  instruction to review signs/symptoms of diabetes, desired ranges of glucose level fasting, after meals and with exercise. Acknowledge that pre and post exercise glucose checks will be done for 3 sessions at entry of program.  Know Your Numbers and Risk Factors: -Group verbal and written instruction about important numbers in your health.  Discussion of what are risk factors and how they play a role in the disease process.  Review of Cholesterol, Blood Pressure, Diabetes, and BMI and the role they play in your overall health.   Sleep Hygiene: -Provides group verbal and written instruction about how sleep can affect your health.  Define sleep hygiene, discuss sleep cycles and impact of sleep habits. Review good sleep hygiene tips.    Other: -Provides group and verbal instruction on various topics (see comments)   Knowledge Questionnaire Score: Knowledge Questionnaire Score - 11/25/17 1352      Knowledge Questionnaire Score   Pre Score  25/28 correct answers reviewed with Nathalia       Core Components/Risk Factors/Patient Goals at Admission: Personal Goals and Risk Factors at Admission - 11/25/17 1326      Core Components/Risk Factors/Patient Goals on Admission    Weight Management  Obesity;Weight Loss;Yes    Intervention  Weight Management: Develop a combined nutrition and exercise program designed to reach desired caloric intake, while maintaining appropriate intake of nutrient and fiber, sodium and fats, and appropriate energy expenditure required for the weight goal.;Obesity: Provide education and appropriate resources to help participant work on and attain dietary goals.;Weight Management/Obesity: Establish reasonable short term and long term weight goals.;Weight Management: Provide education and appropriate resources to help participant work on and attain dietary goals.    Admit Weight  182 lb 14.4 oz (83 kg)    Goal Weight: Short Term  178 lb (80.7 kg)    Goal Weight: Long Term   135 lb (61.2 kg)    Expected Outcomes  Short Term: Continue to assess and modify interventions until short term weight is achieved;Long Term: Adherence to nutrition and physical activity/exercise program aimed toward attainment of established weight goal;Weight Loss: Understanding of general recommendations for a balanced deficit meal plan, which promotes 1-2 lb weight loss per week and includes a negative energy balance of [phone] kcal/d;Understanding recommendations for meals to include 15-35% energy as protein, 25-35% energy from fat, 35-60% energy from carbohydrates, less than 262m of dietary cholesterol, 20-35 gm of total fiber daily;Understanding of distribution of calorie intake throughout the day with the consumption of 4-5 meals/snacks    Lipids  Yes    Intervention  Provide education and support for participant on nutrition & aerobic/resistive exercise along with prescribed medications to achieve LDL <732m HDL >4062m   Expected Outcomes  Short Term: Participant states understanding of desired cholesterol values and is compliant with medications prescribed. Participant is following exercise prescription and nutrition guidelines.;Long Term: Cholesterol controlled with medications as prescribed, with individualized exercise RX and with personalized nutrition plan. Value goals: LDL < 81m4mDL > 40 mg.    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 Teach and support self-help strategies.;Refer participants experiencing significant psychosocial distress to appropriate mental health specialists for further evaluation and treatment. When possible, include family members and significant others in education/counseling sessions.    Expected Outcomes  Short Term: Participant  demonstrates changes in health-related behavior, relaxation and other stress management skills, ability to obtain effective social support, and compliance with psychotropic medications if prescribed.;Long Term: Emotional wellbeing is indicated by absence  of clinically significant psychosocial distress or social isolation.       Core Components/Risk Factors/Patient Goals Review:    Core Components/Risk Factors/Patient Goals at Discharge (Final Review):    ITP Comments: ITP Comments    Row Name 11/25/17 1322 12/01/17 0628 12/24/17 1027 12/29/17 1024 01/26/18 0630   ITP Comments  Med review completed. Initial ITP created. Diagnosis can be found in [facility] 10/26/17  30 day review. Continue with ITP unless directed changes per Medical Director review.   New to program  [place] has been out since 12/6  due to the weather and moving and plans to return after the first of the year.  30 day review. Continue with ITP unless directed changes per Medical Director review.   2 visits this month  30 Day review. Continue with ITP unless directed changes per Medical Director review.  no visits in Jan      Comments: discharge ITP

## 2018-01-27 NOTE — Telephone Encounter (Signed)
Would like to continue - has been busy - can come back in a couple weeks - staff will check on status

## 2018-01-31 ENCOUNTER — Encounter: Payer: BC Managed Care – PPO | Attending: Internal Medicine

## 2018-01-31 DIAGNOSIS — Z7902 Long term (current) use of antithrombotics/antiplatelets: Secondary | ICD-10-CM | POA: Insufficient documentation

## 2018-01-31 DIAGNOSIS — F17210 Nicotine dependence, cigarettes, uncomplicated: Secondary | ICD-10-CM | POA: Insufficient documentation

## 2018-01-31 DIAGNOSIS — Z79899 Other long term (current) drug therapy: Secondary | ICD-10-CM | POA: Insufficient documentation

## 2018-01-31 DIAGNOSIS — F419 Anxiety disorder, unspecified: Secondary | ICD-10-CM | POA: Insufficient documentation

## 2018-01-31 DIAGNOSIS — I213 ST elevation (STEMI) myocardial infarction of unspecified site: Secondary | ICD-10-CM | POA: Insufficient documentation

## 2018-01-31 DIAGNOSIS — Z7982 Long term (current) use of aspirin: Secondary | ICD-10-CM | POA: Insufficient documentation

## 2018-01-31 DIAGNOSIS — F329 Major depressive disorder, single episode, unspecified: Secondary | ICD-10-CM | POA: Insufficient documentation

## 2018-03-21 ENCOUNTER — Encounter: Payer: BC Managed Care – PPO | Attending: Internal Medicine | Admitting: *Deleted

## 2018-03-21 VITALS — Ht 61.75 in | Wt 187.7 lb

## 2018-03-21 DIAGNOSIS — Z7902 Long term (current) use of antithrombotics/antiplatelets: Secondary | ICD-10-CM | POA: Insufficient documentation

## 2018-03-21 DIAGNOSIS — F329 Major depressive disorder, single episode, unspecified: Secondary | ICD-10-CM | POA: Diagnosis not present

## 2018-03-21 DIAGNOSIS — F17210 Nicotine dependence, cigarettes, uncomplicated: Secondary | ICD-10-CM | POA: Diagnosis not present

## 2018-03-21 DIAGNOSIS — I213 ST elevation (STEMI) myocardial infarction of unspecified site: Secondary | ICD-10-CM | POA: Insufficient documentation

## 2018-03-21 DIAGNOSIS — Z79899 Other long term (current) drug therapy: Secondary | ICD-10-CM | POA: Insufficient documentation

## 2018-03-21 DIAGNOSIS — F419 Anxiety disorder, unspecified: Secondary | ICD-10-CM | POA: Insufficient documentation

## 2018-03-21 DIAGNOSIS — Z7982 Long term (current) use of aspirin: Secondary | ICD-10-CM | POA: Insufficient documentation

## 2018-03-21 NOTE — Progress Notes (Signed)
Cardiac Individual Treatment Plan  Patient Details  Name: Angela Mccullough MRN: [id number] Date of Birth: 14-Feb-1966 Referring Provider:     Cardiac Rehab from 03/21/2018 in [facility] Cardiac and Pulmonary Rehab  Referring Provider  [name]      Initial Encounter Date:    Cardiac Rehab from 03/21/2018 in [facility] Cardiac and Pulmonary Rehab  Date  03/21/18  Referring Provider  [name]      Visit Diagnosis: ST elevation myocardial infarction (STEMI), unspecified artery ([place])  Patient's Home Medications on Admission:  Current Outpatient Medications:  .  albuterol (PROVENTIL HFA;VENTOLIN HFA) 108 (90 BASE) MCG/ACT inhaler, Inhale 2 puffs into the lungs every 6 (six) hours as needed for wheezing or shortness of breath. (Patient not taking: Reported on 11/25/2017), Disp: 1 Inhaler, Rfl: 12 .  aspirin 81 MG chewable tablet, Chew 1 tablet (81 mg total) by mouth daily., Disp: 30 tablet, Rfl: 1 .  atorvastatin (LIPITOR) 80 MG tablet, Take 1 tablet (80 mg total) by mouth daily at 6 PM., Disp: 30 tablet, Rfl: 1 .  baclofen (LIORESAL) 10 MG tablet, Take 0.5-1 tablets (5-10 mg total) by mouth 3 (three) times daily as needed for muscle spasms., Disp: 30 each, Rfl: 1 .  carvedilol (COREG) 3.125 MG tablet, Take 1 tablet (3.125 mg total) by mouth 2 (two) times daily with a meal., Disp: 60 tablet, Rfl: 1 .  cetirizine (ZYRTEC) 10 MG tablet, Take 10 mg by mouth daily., Disp: , Rfl:  .  Cholecalciferol 2000 units CAPS, Take 1 capsule (2,000 Units total) by mouth daily. (Patient not taking: Reported on 11/25/2017), Disp: 30 each, Rfl:  .  citalopram (CELEXA) 20 MG tablet, Take 20 mg by mouth daily., Disp: , Rfl:  .  citalopram (CELEXA) 40 MG tablet, , Disp: , Rfl:  .  clopidogrel (PLAVIX) 75 MG tablet, Take 1 tablet (75 mg total) by mouth daily with breakfast., Disp: 30 tablet, Rfl: 1 .  diphenhydrAMINE (BENADRYL) 25 MG tablet, Take 25 mg by mouth every 4 (four) hours as needed., Disp: , Rfl:  .  fluticasone  (FLOVENT HFA) 110 MCG/ACT inhaler, Inhale 1 puff into the lungs 2 (two) times daily. (Patient not taking: Reported on 11/25/2017), Disp: 1 Inhaler, Rfl: 12 .  mometasone (NASONEX) 50 MCG/ACT nasal spray, Place 2 sprays into the nose daily. (Patient not taking: Reported on 11/25/2017), Disp: 17 g, Rfl: 12 .  Psyllium 28.3 % POWD, Take 500 mg by mouth daily. (Patient not taking: Reported on 10/28/2017), Disp: 283 g, Rfl:   Past Medical History: Past Medical History:  Diagnosis Date  . Anxiety and depression   . ASD (atrial septal defect), common atrium (single atrium)   . Heart murmur   . Heartburn   . Kidney stone     Tobacco Use: Social History   Tobacco Use  Smoking Status Current Every Day Smoker  . Packs/day: 0.50  . Types: Cigarettes  Smokeless Tobacco Current User  Tobacco Comment   Cutting down to five cigs a day 11/25/17    Labs: Recent Review Flowsheet Data    Labs for ITP Cardiac and Pulmonary Rehab Latest Ref Rng & Units 07/09/2009 11/26/2015 10/26/2017   Cholestrol 0 - 200 mg/dL - 214(H) 213(H)   LDLCALC 0 - 99 mg/dL - 128(H) 130(H)   LDLDIRECT mg/dL 130.4 - -   HDL >40 mg/dL - 63 49   Trlycerides <150 mg/dL - 115 172(H)   Hemoglobin A1c 4.8 - 5.6 % - - 5.4  Exercise Target Goals: Date: 03/21/18  Exercise Program Goal: Individual exercise prescription set using results from initial 6 min walk test and THRR while considering  patient's activity barriers and safety.   Exercise Prescription Goal: Initial exercise prescription builds to 30-45 minutes a day of aerobic activity, 2-3 days per week.  Home exercise guidelines will be given to patient during program as part of exercise prescription that the participant will acknowledge.  Activity Barriers & Risk Stratification: Activity Barriers & Cardiac Risk Stratification - 11/25/17 1355      Activity Barriers & Cardiac Risk Stratification   Activity Barriers  None    Cardiac Risk Stratification  Moderate        6 Minute Walk: 6 Minute Walk    Row Name 11/25/17 1452 03/21/18 1623       6 Minute Walk   Distance  1350 feet  1400 feet    Walk Time  6 minutes  6 minutes    # of Rest Breaks  0  0    MPH  2.55  -    METS  3.68  3.66    RPE  12  12    Perceived Dyspnea   1  1    VO2 Peak  12.89  12.82    Symptoms  No  Yes (comment)    Comments  -  headache 2/10 - happens when she exerts - went away after walk test    Resting HR  68 bpm  87 bpm    Resting BP  126/70  116/62    Resting Oxygen Saturation   97 %  100 %    Exercise Oxygen Saturation  during 6 min walk  98 %  99 %    Max Ex. HR  104 bpm  100 bpm    Max Ex. BP  122/60  132/66    2 Minute Post BP  106/58  104/56       Oxygen Initial Assessment:   Oxygen Re-Evaluation:   Oxygen Discharge (Final Oxygen Re-Evaluation):   Initial Exercise Prescription: Initial Exercise Prescription - 03/21/18 1600      Date of Initial Exercise RX and Referring Provider   Date  03/21/18    Referring Provider  [name]      Treadmill   MPH  2.5    Grade  1.5    Minutes  15    METs  3.5      Recumbant Bike   Level  5    RPM  60    Watts  45    Minutes  15    METs  3.5      REL-XR   Level  3    Speed  50    Minutes  15    METs  3.5      Prescription Details   Frequency (times per week)  3    Duration  Progress to 45 minutes of aerobic exercise without signs/symptoms of physical distress      Intensity   THRR 40-80% of Max Heartrate  119-152    Ratings of Perceived Exertion  11-13    Perceived Dyspnea  0-4      Resistance Training   Training Prescription  Yes    Weight  3 lb    Reps  10-15       Perform Capillary Blood Glucose checks as needed.  Exercise Prescription Changes: Exercise Prescription Changes    Row Name 11/25/17 1300 12/10/17 1100  Response to Exercise   Blood Pressure (Admit)  126/70  110/62      Blood Pressure (Exercise)  122/66  122/62      Blood Pressure (Exit)  106/58   112/60      Heart Rate (Admit)  81 bpm  71 bpm      Heart Rate (Exercise)  104 bpm  144 bpm      Heart Rate (Exit)  84 bpm  101 bpm      Oxygen Saturation (Admit)  97 %  -      Rating of Perceived Exertion (Exercise)  12  15      Symptoms  -  none      Comments  -  ellipical difficult      Duration  -  Progress to 45 minutes of aerobic exercise without signs/symptoms of physical distress      Intensity  -  THRR unchanged        Progression   Progression  -  Continue to progress workloads to maintain intensity without signs/symptoms of physical distress.        Treadmill   MPH  -  2.5      Grade  -  1.5      Minutes  -  15      METs  -  3.5        Recumbant Bike   Level  -  5      RPM  -  60      Watts  -  18      Minutes  -  15      METs  -  2.6        Elliptical   Level  -  1      Speed  -  3      Minutes  -  15         Exercise Comments: Exercise Comments    Row Name 12/01/17 1646           Exercise Comments  First full day of exercise!  Patient was oriented to gym and equipment including functions, settings, policies, and procedures.  Patient's individual exercise prescription and treatment plan were reviewed.  All starting workloads were established based on the results of the 6 minute walk test done at initial orientation visit.  The plan for exercise progression was also introduced and progression will be customized based on patient's performance and goals.          Exercise Goals and Review: Exercise Goals    Row Name 11/25/17 1452 03/21/18 1623           Exercise Goals   Increase Physical Activity  Yes  Yes      Intervention  Provide advice, education, support and counseling about physical activity/exercise needs.;Develop an individualized exercise prescription for aerobic and resistive training based on initial evaluation findings, risk stratification, comorbidities and participant's personal goals.  Provide advice, education, support and counseling  about physical activity/exercise needs.;Develop an individualized exercise prescription for aerobic and resistive training based on initial evaluation findings, risk stratification, comorbidities and participant's personal goals.      Expected Outcomes  Achievement of increased cardiorespiratory fitness and enhanced flexibility, muscular endurance and strength shown through measurements of functional capacity and personal statement of participant.  Short Term: Attend rehab on a regular basis to increase amount of physical activity.;Long Term: Add in home exercise to make exercise part of routine and to increase amount  of physical activity.;Long Term: Exercising regularly at least 3-5 days a week.      Increase Strength and Stamina  Yes  Yes      Intervention  Provide advice, education, support and counseling about physical activity/exercise needs.;Develop an individualized exercise prescription for aerobic and resistive training based on initial evaluation findings, risk stratification, comorbidities and participant's personal goals.  Provide advice, education, support and counseling about physical activity/exercise needs.;Develop an individualized exercise prescription for aerobic and resistive training based on initial evaluation findings, risk stratification, comorbidities and participant's personal goals.      Expected Outcomes  Achievement of increased cardiorespiratory fitness and enhanced flexibility, muscular endurance and strength shown through measurements of functional capacity and personal statement of participant.  Short Term: Increase workloads from initial exercise prescription for resistance, speed, and METs.;Short Term: Perform resistance training exercises routinely during rehab and add in resistance training at home;Long Term: Improve cardiorespiratory fitness, muscular endurance and strength as measured by increased METs and functional capacity (6MWT)      Able to understand and use rate of  perceived exertion (RPE) scale  Yes  Yes      Intervention  Provide education and explanation on how to use RPE scale  Provide education and explanation on how to use RPE scale      Expected Outcomes  Short Term: Able to use RPE daily in rehab to express subjective intensity level;Long Term:  Able to use RPE to guide intensity level when exercising independently  Short Term: Able to use RPE daily in rehab to express subjective intensity level;Long Term:  Able to use RPE to guide intensity level when exercising independently      Able to understand and use Dyspnea scale  Yes  Yes      Intervention  Provide education and explanation on how to use Dyspnea scale  Provide education and explanation on how to use Dyspnea scale      Expected Outcomes  Short Term: Able to use Dyspnea scale daily in rehab to express subjective sense of shortness of breath during exertion;Long Term: Able to use Dyspnea scale to guide intensity level when exercising independently  Short Term: Able to use Dyspnea scale daily in rehab to express subjective sense of shortness of breath during exertion;Long Term: Able to use Dyspnea scale to guide intensity level when exercising independently      Knowledge and understanding of Target Heart Rate Range (THRR)  Yes  Yes      Intervention  Provide education and explanation of THRR including how the numbers were predicted and where they are located for reference  Provide education and explanation of THRR including how the numbers were predicted and where they are located for reference      Expected Outcomes  Short Term: Able to state/look up THRR;Short Term: Able to use daily as guideline for intensity in rehab;Long Term: Able to use THRR to govern intensity when exercising independently  Short Term: Able to state/look up THRR;Long Term: Able to use THRR to govern intensity when exercising independently;Short Term: Able to use daily as guideline for intensity in rehab      Able to check pulse  independently  Yes  Yes      Intervention  Provide education and demonstration on how to check pulse in carotid and radial arteries.;Review the importance of being able to check your own pulse for safety during independent exercise  Provide education and demonstration on how to check pulse in carotid and radial arteries.;Review  the importance of being able to check your own pulse for safety during independent exercise      Expected Outcomes  Short Term: Able to explain why pulse checking is important during independent exercise;Long Term: Able to check pulse independently and accurately  Short Term: Able to explain why pulse checking is important during independent exercise;Long Term: Able to check pulse independently and accurately      Understanding of Exercise Prescription  Yes  Yes      Intervention  Provide education, explanation, and written materials on patient's individual exercise prescription  Provide education, explanation, and written materials on patient's individual exercise prescription      Expected Outcomes  Short Term: Able to explain program exercise prescription;Long Term: Able to explain home exercise prescription to exercise independently  Short Term: Able to explain program exercise prescription;Long Term: Able to explain home exercise prescription to exercise independently         Exercise Goals Re-Evaluation : Exercise Goals Re-Evaluation    Row Name 12/01/17 1625 12/10/17 1208           Exercise Goal Re-Evaluation   Exercise Goals Review  Able to understand and use rate of perceived exertion (RPE) scale;Increase Strength and Stamina;Increase Physical Activity;Understanding of Exercise Prescription  Increase Physical Activity;Increase Strength and Stamina;Able to understand and use rate of perceived exertion (RPE) scale      Comments  Reviewed RPE scale, THR and program prescription with pt today.  Pt voiced understanding and was given a copy of goals to take home.    Samon is doing well in her first sessions.  She found the elliptical challenging, however, is motivated to work towards getting more endurance.      Expected Outcomes  Short: Use RPE daily to regulate intensity.  Long: Follow program prescription in THR.  Short - Mattea will attend regularly [place] will be able to do 15 continuous minutes on elliptical         Discharge Exercise Prescription (Final Exercise Prescription Changes): Exercise Prescription Changes - 12/10/17 1100      Response to Exercise   Blood Pressure (Admit)  110/62    Blood Pressure (Exercise)  122/62    Blood Pressure (Exit)  112/60    Heart Rate (Admit)  71 bpm    Heart Rate (Exercise)  144 bpm    Heart Rate (Exit)  101 bpm    Rating of Perceived Exertion (Exercise)  15    Symptoms  none    Comments  ellipical difficult    Duration  Progress to 45 minutes of aerobic exercise without signs/symptoms of physical distress    Intensity  THRR unchanged      Progression   Progression  Continue to progress workloads to maintain intensity without signs/symptoms of physical distress.      Treadmill   MPH  2.5    Grade  1.5    Minutes  15    METs  3.5      Recumbant Bike   Level  5    RPM  60    Watts  18    Minutes  15    METs  2.6      Elliptical   Level  1    Speed  3    Minutes  15       Nutrition:  Target Goals: Understanding of nutrition guidelines, daily intake of sodium <152m, cholesterol <2028m calories 30% from fat and 7% or less from saturated fats,  daily to have 5 or more servings of fruits and vegetables.  Biometrics: Pre Biometrics - 03/21/18 1622      Pre Biometrics   Height  5' 1.75" (1.568 m)    Weight  187 lb 11.2 oz (85.1 kg)    Waist Circumference  40 inches    Hip Circumference  49 inches    Waist to Hip Ratio  0.82 %    BMI (Calculated)  34.63        Nutrition Therapy Plan and Nutrition Goals:   Nutrition Assessments: Nutrition Assessments - 11/25/17 1331       MEDFICTS Scores   Pre Score  65       Nutrition Goals Re-Evaluation:   Nutrition Goals Discharge (Final Nutrition Goals Re-Evaluation):   Psychosocial: Target Goals: Acknowledge presence or absence of significant depression and/or stress, maximize coping skills, provide positive support system. Participant is able to verbalize types and ability to use techniques and skills needed for reducing stress and depression.   Initial Review & Psychosocial Screening: Initial Psych Review & Screening - 11/25/17 1344      Initial Review   Current issues with  Current Depression;Current Anxiety/Panic;Current Stress Concerns    Source of Stress Concerns  Financial    Comments  She is returning to work from being on FMLA due to her STEMI and taking care of her dad who passed away. He was moved to hospice the same day she had her STEMI. She is playing "catch up" on her bills now that she has returned to work. She has struggled with depression in the past and currently, she is getting her meds adjusted.       Family Dynamics   Good Support System?  Yes Long time boyfriend and children      Screening Interventions   Interventions  Yes;Encouraged to exercise;Program counselor consult    Expected Outcomes  Short Term goal: Utilizing psychosocial counselor, staff and physician to assist with identification of specific Stressors or current issues interfering with healing process. Setting desired goal for each stressor or current issue identified.;Long Term Goal: Stressors or current issues are controlled or eliminated.;Short Term goal: Identification and review with participant of any Quality of Life or Depression concerns found by scoring the questionnaire.;Long Term goal: The participant improves quality of Life and PHQ9 Scores as seen by post scores and/or verbalization of changes       Quality of Life Scores:  Quality of Life - 11/25/17 1350      Quality of Life Scores   Health/Function Pre   20.53 %    Socioeconomic Pre  20.5 %    Psych/Spiritual Pre  20.29 %    Family Pre  20.6 %    GLOBAL Pre  20.49 %      Scores of 19 and below usually indicate a poorer quality of life in these areas.  A difference of  2-3 points is a clinically meaningful difference.  A difference of 2-3 points in the total score of the Quality of Life Index has been associated with significant improvement in overall quality of life, self-image, physical symptoms, and general health in studies assessing change in quality of life.  PHQ-9: Recent Review Flowsheet Data    Depression screen [facility] 2/9 11/25/2017 01/20/2016 12/12/2015 10/14/2015   Decreased Interest 2 0 0 0   Down, Depressed, Hopeless 3 0 0 0   PHQ - 2 Score 5 0 0 0   Altered sleeping 1 - - -  Tired, decreased energy 3 - - -   Change in appetite 3 - - -   Feeling bad or failure about yourself  0 - - -   Trouble concentrating 1 - - -   Moving slowly or fidgety/restless 0 - - -   Suicidal thoughts 0 - - -   PHQ-9 Score 13 - - -   Difficult doing work/chores Somewhat difficult - - -     Interpretation of Total Score  Total Score Depression Severity:  1-4 = Minimal depression, 5-9 = Mild depression, 10-14 = Moderate depression, 15-19 = Moderately severe depression, 20-27 = Severe depression   Psychosocial Evaluation and Intervention:   Psychosocial Re-Evaluation:   Psychosocial Discharge (Final Psychosocial Re-Evaluation):   Vocational Rehabilitation: Provide vocational rehab assistance to qualifying candidates.   Vocational Rehab Evaluation & Intervention:   Education: Education Goals: Education classes will be provided on a variety of topics geared toward better understanding of heart health and risk factor modification. Participant will state understanding/return demonstration of topics presented as noted by education test scores.  Learning Barriers/Preferences: Learning Barriers/Preferences - 11/25/17 1352      Learning  Barriers/Preferences   Learning Barriers  None    Learning Preferences  Group Instruction;Written Material       Education Topics:  AED/CPR: - Group verbal and written instruction with the use of models to demonstrate the basic use of the AED with the basic ABC's of resuscitation.   General Nutrition Guidelines/Fats and Fiber: -Group instruction provided by verbal, written material, models and posters to present the general guidelines for heart healthy nutrition. Gives an explanation and review of dietary fats and fiber.   Cardiac Rehab from 03/21/2018 in [facility] Cardiac and Pulmonary Rehab  Date  03/21/18  Educator  PI  Instruction Review Code  1- Verbalizes Understanding      Controlling Sodium/Reading Food Labels: -Group verbal and written material supporting the discussion of sodium use in heart healthy nutrition. Review and explanation with models, verbal and written materials for utilization of the food label.   Exercise Physiology & General Exercise Guidelines: - Group verbal and written instruction with models to review the exercise physiology of the cardiovascular system and associated critical values. Provides general exercise guidelines with specific guidelines to those with heart or lung disease.    Aerobic Exercise & Resistance Training: - Gives group verbal and written instruction on the various components of exercise. Focuses on aerobic and resistive training programs and the benefits of this training and how to safely progress through these programs..   Flexibility, Balance, Mind/Body Relaxation: Provides group verbal/written instruction on the benefits of flexibility and balance training, including mind/body exercise modes such as yoga, pilates and tai chi.  Demonstration and skill practice provided.   Stress and Anxiety: - Provides group verbal and written instruction about the health risks of elevated stress and causes of high stress.  Discuss the correlation  between heart/lung disease and anxiety and treatment options. Review healthy ways to manage with stress and anxiety.   Depression: - Provides group verbal and written instruction on the correlation between heart/lung disease and depressed mood, treatment options, and the stigmas associated with seeking treatment.   Anatomy & Physiology of the Heart: - Group verbal and written instruction and models provide basic cardiac anatomy and physiology, with the coronary electrical and arterial systems. Review of Valvular disease and Heart Failure   Cardiac Procedures: - Group verbal and written instruction to review commonly prescribed medications for heart disease.  Reviews the medication, class of the drug, and side effects. Includes the steps to properly store meds and maintain the prescription regimen. (beta blockers and nitrates)   Cardiac Medications I: - Group verbal and written instruction to review commonly prescribed medications for heart disease. Reviews the medication, class of the drug, and side effects. Includes the steps to properly store meds and maintain the prescription regimen.   Cardiac Medications II: -Group verbal and written instruction to review commonly prescribed medications for heart disease. Reviews the medication, class of the drug, and side effects. (all other drug classes)    Go Sex-Intimacy & Heart Disease, Get SMART - Goal Setting: - Group verbal and written instruction through game format to discuss heart disease and the return to sexual intimacy. Provides group verbal and written material to discuss and apply goal setting through the application of the S.M.A.R.T. Method.   Other Matters of the Heart: - Provides group verbal, written materials and models to describe Stable Angina and Peripheral Artery. Includes description of the disease process and treatment options available to the cardiac patient.   Exercise & Equipment Safety: - Individual verbal  instruction and demonstration of equipment use and safety with use of the equipment.   Cardiac Rehab from 03/21/2018 in [facility] Cardiac and Pulmonary Rehab  Date  11/25/17  Educator  mc  Instruction Review Code  1- Verbalizes Understanding      Infection Prevention: - Provides verbal and written material to individual with discussion of infection control including proper hand washing and proper equipment cleaning during exercise session.   Cardiac Rehab from 03/21/2018 in [facility] Cardiac and Pulmonary Rehab  Date  11/25/17  Educator  mc  Instruction Review Code  1- Verbalizes Understanding      Falls Prevention: - Provides verbal and written material to individual with discussion of falls prevention and safety.   Cardiac Rehab from 03/21/2018 in [facility] Cardiac and Pulmonary Rehab  Date  11/25/17  Educator  mc  Instruction Review Code  1- Verbalizes Understanding      Diabetes: - Individual verbal and written instruction to review signs/symptoms of diabetes, desired ranges of glucose level fasting, after meals and with exercise. Acknowledge that pre and post exercise glucose checks will be done for 3 sessions at entry of program.   Know Your Numbers and Risk Factors: -Group verbal and written instruction about important numbers in your health.  Discussion of what are risk factors and how they play a role in the disease process.  Review of Cholesterol, Blood Pressure, Diabetes, and BMI and the role they play in your overall health.   Sleep Hygiene: -Provides group verbal and written instruction about how sleep can affect your health.  Define sleep hygiene, discuss sleep cycles and impact of sleep habits. Review good sleep hygiene tips.    Other: -Provides group and verbal instruction on various topics (see comments)   Knowledge Questionnaire Score: Knowledge Questionnaire Score - 11/25/17 1352      Knowledge Questionnaire Score   Pre Score  25/28 correct answers reviewed with Feather        Core Components/Risk Factors/Patient Goals at Admission: Personal Goals and Risk Factors at Admission - 11/25/17 1326      Core Components/Risk Factors/Patient Goals on Admission    Weight Management  Obesity;Weight Loss;Yes    Intervention  Weight Management: Develop a combined nutrition and exercise program designed to reach desired caloric intake, while maintaining appropriate intake of nutrient and fiber, sodium and fats, and appropriate energy  expenditure required for the weight goal.;Obesity: Provide education and appropriate resources to help participant work on and attain dietary goals.;Weight Management/Obesity: Establish reasonable short term and long term weight goals.;Weight Management: Provide education and appropriate resources to help participant work on and attain dietary goals.    Admit Weight  182 lb 14.4 oz (83 kg)    Goal Weight: Short Term  178 lb (80.7 kg)    Goal Weight: Long Term  135 lb (61.2 kg)    Expected Outcomes  Short Term: Continue to assess and modify interventions until short term weight is achieved;Long Term: Adherence to nutrition and physical activity/exercise program aimed toward attainment of established weight goal;Weight Loss: Understanding of general recommendations for a balanced deficit meal plan, which promotes 1-2 lb weight loss per week and includes a negative energy balance of [phone] kcal/d;Understanding recommendations for meals to include 15-35% energy as protein, 25-35% energy from fat, 35-60% energy from carbohydrates, less than 222m of dietary cholesterol, 20-35 gm of total fiber daily;Understanding of distribution of calorie intake throughout the day with the consumption of 4-5 meals/snacks    Lipids  Yes    Intervention  Provide education and support for participant on nutrition & aerobic/resistive exercise along with prescribed medications to achieve LDL <765m HDL >4032m   Expected Outcomes  Short Term: Participant states  understanding of desired cholesterol values and is compliant with medications prescribed. Participant is following exercise prescription and nutrition guidelines.;Long Term: Cholesterol controlled with medications as prescribed, with individualized exercise RX and with personalized nutrition plan. Value goals: LDL < 27m62mDL > 40 mg.    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 Teach and support self-help strategies.;Refer participants experiencing significant psychosocial distress to appropriate mental health specialists for further evaluation and treatment. When possible, include family members and significant others in education/counseling sessions.    Expected Outcomes  Short Term: Participant demonstrates changes in health-related behavior, relaxation and other stress management skills, ability to obtain effective social support, and compliance with psychotropic medications if prescribed.;Long Term: Emotional wellbeing is indicated by absence of clinically significant psychosocial distress or social isolation.       Core Components/Risk Factors/Patient Goals Review:    Core Components/Risk Factors/Patient Goals at Discharge (Final Review):    ITP Comments: ITP Comments    Row Name 11/25/17 1322 01/26/18 0630         ITP Comments  Med review completed. Initial ITP created. Diagnosis can be found in [facility]30/18  30 Day review. Continue with ITP unless directed changes per Medical Director review.  no visits in Jan         Comments: PT is restarting Cardiac Rehab at session 4

## 2018-03-21 NOTE — Progress Notes (Signed)
Daily Session Note  Patient Details  Name: Angela Mccullough MRN: [id number] Date of Birth: [birth year] Referring Provider:     Cardiac Rehab from 03/21/2018 in [facility]  Referring Provider  [name]      Encounter Date: 03/21/2018  Check In: Session Check In - 03/21/18 1636      Check-In   Staff Present  Nada Maclachlan, BA, ACSM CEP, Exercise Physiologist;Lacrisha Bielicki Amedeo Plenty, BS, ACSM CEP, Exercise Physiologist;Meredith Sherryll Burger, RN BSN;Carroll Enterkin, RN, BSN    Supervising physician immediately available to respond to emergencies  See telemetry face sheet for immediately available ER MD    Medication changes reported      No    Fall or balance concerns reported     No    Tobacco Cessation  Use Decreased down to 3 cigarettes a day    Warm-up and Cool-down  Performed on first and last piece of equipment    Resistance Training Performed  Yes    VAD Patient?  No      Pain Assessment   Currently in Pain?  No/denies    Multiple Pain Sites  No          Social History   Tobacco Use  Smoking Status Current Every Day Smoker  . Packs/day: 0.50  . Types: Cigarettes  Smokeless Tobacco Current User  Tobacco Comment   Cutting down to five cigs a day 11/25/17    Goals Met:  Exercise tolerated well Personal goals reviewed No report of cardiac concerns or symptoms Strength training completed today  Goals Unmet:  Not Applicable  Comments: First full day of exercise!  Patient was oriented to gym and equipment including functions, settings, policies, and procedures.  Patient's individual exercise prescription and treatment plan were reviewed.  All starting workloads were established based on the results of the 6 minute walk test done at initial orientation visit.  The plan for exercise progression was also introduced and progression will be customized based on patient's performance and goals.    Dr. Emily Filbert is Medical Director for [place] and [facility].

## 2018-03-21 NOTE — Patient Instructions (Signed)
Patient Instructions  Patient Details  Name: Angela Mccullough MRN: [id number] Date of Birth: [birth year] Referring Provider:  Corey Skains, MD  Below are your personal goals for exercise, nutrition, and risk factors. Our goal is to help you stay on track towards obtaining and maintaining these goals. We will be discussing your progress on these goals with you throughout the program.  Initial Exercise Prescription: Initial Exercise Prescription - 03/21/18 1600      Date of Initial Exercise RX and Referring Provider   Date  03/21/18    Referring Provider  [name]      Treadmill   MPH  2.5    Grade  1.5    Minutes  15    METs  3.5      Recumbant Bike   Level  5    RPM  60    Watts  45    Minutes  15    METs  3.5      REL-XR   Level  3    Speed  50    Minutes  15    METs  3.5      Prescription Details   Frequency (times per week)  3    Duration  Progress to 45 minutes of aerobic exercise without signs/symptoms of physical distress      Intensity   THRR 40-80% of Max Heartrate  119-152    Ratings of Perceived Exertion  11-13    Perceived Dyspnea  0-4      Resistance Training   Training Prescription  Yes    Weight  3 lb    Reps  10-15       Exercise Goals: Frequency: Be able to perform aerobic exercise two to three times per week in program working toward 2-5 days per week of home exercise.  Intensity: Work with a perceived exertion of 11 (fairly light) - 15 (hard) while following your exercise prescription.  We will make changes to your prescription with you as you progress through the program.   Duration: Be able to do 30 to 45 minutes of continuous aerobic exercise in addition to a 5 minute warm-up and a 5 minute cool-down routine.   Nutrition Goals: Your personal nutrition goals will be established when you do your nutrition analysis with the dietician.  The following are general nutrition guidelines to follow: Cholesterol < 200mg /day Sodium <  1500mg /day Fiber: Women over 50 yrs - 21 grams per day  Personal Goals: Personal Goals and Risk Factors at Admission - 11/25/17 1326      Core Components/Risk Factors/Patient Goals on Admission    Weight Management  Obesity;Weight Loss;Yes    Intervention  Weight Management: Develop a combined nutrition and exercise program designed to reach desired caloric intake, while maintaining appropriate intake of nutrient and fiber, sodium and fats, and appropriate energy expenditure required for the weight goal.;Obesity: Provide education and appropriate resources to help participant work on and attain dietary goals.;Weight Management/Obesity: Establish reasonable short term and long term weight goals.;Weight Management: Provide education and appropriate resources to help participant work on and attain dietary goals.    Admit Weight  182 lb 14.4 oz (83 kg)    Goal Weight: Short Term  178 lb (80.7 kg)    Goal Weight: Long Term  135 lb (61.2 kg)    Expected Outcomes  Short Term: Continue to assess and modify interventions until short term weight is achieved;Long Term: Adherence to nutrition and physical activity/exercise program aimed toward  attainment of established weight goal;Weight Loss: Understanding of general recommendations for a balanced deficit meal plan, which promotes 1-2 lb weight loss per week and includes a negative energy balance of [phone] kcal/d;Understanding recommendations for meals to include 15-35% energy as protein, 25-35% energy from fat, 35-60% energy from carbohydrates, less than 200mg  of dietary cholesterol, 20-35 gm of total fiber daily;Understanding of distribution of calorie intake throughout the day with the consumption of 4-5 meals/snacks    Lipids  Yes    Intervention  Provide education and support for participant on nutrition & aerobic/resistive exercise along with prescribed medications to achieve LDL 70mg , HDL >40mg .    Expected Outcomes  Short Term: Participant states  understanding of desired cholesterol values and is compliant with medications prescribed. Participant is following exercise prescription and nutrition guidelines.;Long Term: Cholesterol controlled with medications as prescribed, with individualized exercise RX and with personalized nutrition plan. Value goals: LDL < 70mg , HDL > 40 mg.    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 Teach and support self-help strategies.;Refer participants experiencing significant psychosocial distress to appropriate mental health specialists for further evaluation and treatment. When possible, include family members and significant others in education/counseling sessions.    Expected Outcomes  Short Term: Participant demonstrates changes in health-related behavior, relaxation and other stress management skills, ability to obtain effective social support, and compliance with psychotropic medications if prescribed.;Long Term: Emotional wellbeing is indicated by absence of clinically significant psychosocial distress or social isolation.       Tobacco Use Initial Evaluation: Social History   Tobacco Use  Smoking Status Current Every Day Smoker  . Packs/day: 0.50  . Types: Cigarettes  Smokeless Tobacco Current User  Tobacco Comment   Cutting down to five cigs a day 11/25/17    Exercise Goals and Review: Exercise Goals    Row Name 11/25/17 1452 03/21/18 1623           Exercise Goals   Increase Physical Activity  Yes  Yes      Intervention  Provide advice, education, support and counseling about physical activity/exercise needs.;Develop an individualized exercise prescription for aerobic and resistive training based on initial evaluation findings, risk stratification, comorbidities and  participant's personal goals.  Provide advice, education, support and counseling about physical activity/exercise needs.;Develop an individualized exercise prescription for aerobic and resistive training based on initial evaluation findings, risk stratification, comorbidities and participant's personal goals.      Expected Outcomes  Achievement of increased cardiorespiratory fitness and enhanced flexibility, muscular endurance and strength shown through measurements of functional capacity and personal statement of participant.  Short Term: Attend rehab on a regular basis to increase amount of physical activity.;Long Term: Add in home exercise to make exercise part of routine and to increase amount of physical activity.;Long Term: Exercising regularly at least 3-5 days a week.      Increase Strength and Stamina  Yes  Yes      Intervention  Provide advice, education, support and counseling about physical activity/exercise needs.;Develop an individualized exercise prescription for aerobic and resistive training based on initial evaluation findings, risk stratification, comorbidities and participant's personal goals.  Provide advice, education, support and counseling about physical activity/exercise needs.;Develop an individualized exercise prescription for aerobic and resistive training based on initial evaluation findings, risk  stratification, comorbidities and participant's personal goals.      Expected Outcomes  Achievement of increased cardiorespiratory fitness and enhanced flexibility, muscular endurance and strength shown through measurements of functional capacity and personal statement of participant.  Short Term: Increase workloads from initial exercise prescription for resistance, speed, and METs.;Short Term: Perform resistance training exercises routinely during rehab and add in resistance training at home;Long Term: Improve cardiorespiratory fitness, muscular endurance and strength as measured by  increased METs and functional capacity (6MWT)      Able to understand and use rate of perceived exertion (RPE) scale  Yes  Yes      Intervention  Provide education and explanation on how to use RPE scale  Provide education and explanation on how to use RPE scale      Expected Outcomes  Short Term: Able to use RPE daily in rehab to express subjective intensity level;Long Term:  Able to use RPE to guide intensity level when exercising independently  Short Term: Able to use RPE daily in rehab to express subjective intensity level;Long Term:  Able to use RPE to guide intensity level when exercising independently      Able to understand and use Dyspnea scale  Yes  Yes      Intervention  Provide education and explanation on how to use Dyspnea scale  Provide education and explanation on how to use Dyspnea scale      Expected Outcomes  Short Term: Able to use Dyspnea scale daily in rehab to express subjective sense of shortness of breath during exertion;Long Term: Able to use Dyspnea scale to guide intensity level when exercising independently  Short Term: Able to use Dyspnea scale daily in rehab to express subjective sense of shortness of breath during exertion;Long Term: Able to use Dyspnea scale to guide intensity level when exercising independently      Knowledge and understanding of Target Heart Rate Range (THRR)  Yes  Yes      Intervention  Provide education and explanation of THRR including how the numbers were predicted and where they are located for reference  Provide education and explanation of THRR including how the numbers were predicted and where they are located for reference      Expected Outcomes  Short Term: Able to state/look up THRR;Short Term: Able to use daily as guideline for intensity in rehab;Long Term: Able to use THRR to govern intensity when exercising independently  Short Term: Able to state/look up THRR;Long Term: Able to use THRR to govern intensity when exercising independently;Short  Term: Able to use daily as guideline for intensity in rehab      Able to check pulse independently  Yes  Yes      Intervention  Provide education and demonstration on how to check pulse in carotid and radial arteries.;Review the importance of being able to check your own pulse for safety during independent exercise  Provide education and demonstration on how to check pulse in carotid and radial arteries.;Review the importance of being able to check your own pulse for safety during independent exercise      Expected Outcomes  Short Term: Able to explain why pulse checking is important during independent exercise;Long Term: Able to check pulse independently and accurately  Short Term: Able to explain why pulse checking is important during independent exercise;Long Term: Able to check pulse independently and accurately      Understanding of Exercise Prescription  Yes  Yes      Intervention  Provide education, explanation,  and written materials on patient's individual exercise prescription  Provide education, explanation, and written materials on patient's individual exercise prescription      Expected Outcomes  Short Term: Able to explain program exercise prescription;Long Term: Able to explain home exercise prescription to exercise independently  Short Term: Able to explain program exercise prescription;Long Term: Able to explain home exercise prescription to exercise independently         Copy of goals given to participant.

## 2018-03-23 ENCOUNTER — Encounter: Payer: Self-pay | Admitting: *Deleted

## 2018-03-23 DIAGNOSIS — I213 ST elevation (STEMI) myocardial infarction of unspecified site: Secondary | ICD-10-CM

## 2018-03-23 NOTE — Progress Notes (Signed)
Cardiac Individual Treatment Plan  Patient Details  Name: Angela Mccullough MRN: [id number] Date of Birth: 27-Jun-1966 Referring Provider:     Cardiac Rehab from 03/21/2018 in [facility] Cardiac and Pulmonary Rehab  Referring Provider  [name]      Initial Encounter Date:    Cardiac Rehab from 03/21/2018 in [facility] Cardiac and Pulmonary Rehab  Date  03/21/18  Referring Provider  [name]      Visit Diagnosis: ST elevation myocardial infarction (STEMI), unspecified artery ([place])  Patient's Home Medications on Admission:  Current Outpatient Medications:  .  albuterol (PROVENTIL HFA;VENTOLIN HFA) 108 (90 BASE) MCG/ACT inhaler, Inhale 2 puffs into the lungs every 6 (six) hours as needed for wheezing or shortness of breath. (Patient not taking: Reported on 11/25/2017), Disp: 1 Inhaler, Rfl: 12 .  aspirin 81 MG chewable tablet, Chew 1 tablet (81 mg total) by mouth daily., Disp: 30 tablet, Rfl: 1 .  atorvastatin (LIPITOR) 80 MG tablet, Take 1 tablet (80 mg total) by mouth daily at 6 PM., Disp: 30 tablet, Rfl: 1 .  baclofen (LIORESAL) 10 MG tablet, Take 0.5-1 tablets (5-10 mg total) by mouth 3 (three) times daily as needed for muscle spasms., Disp: 30 each, Rfl: 1 .  carvedilol (COREG) 3.125 MG tablet, Take 1 tablet (3.125 mg total) by mouth 2 (two) times daily with a meal., Disp: 60 tablet, Rfl: 1 .  cetirizine (ZYRTEC) 10 MG tablet, Take 10 mg by mouth daily., Disp: , Rfl:  .  Cholecalciferol 2000 units CAPS, Take 1 capsule (2,000 Units total) by mouth daily. (Patient not taking: Reported on 11/25/2017), Disp: 30 each, Rfl:  .  citalopram (CELEXA) 20 MG tablet, Take 20 mg by mouth daily., Disp: , Rfl:  .  citalopram (CELEXA) 40 MG tablet, , Disp: , Rfl:  .  clopidogrel (PLAVIX) 75 MG tablet, Take 1 tablet (75 mg total) by mouth daily with breakfast., Disp: 30 tablet, Rfl: 1 .  diphenhydrAMINE (BENADRYL) 25 MG tablet, Take 25 mg by mouth every 4 (four) hours as needed., Disp: , Rfl:  .  fluticasone  (FLOVENT HFA) 110 MCG/ACT inhaler, Inhale 1 puff into the lungs 2 (two) times daily. (Patient not taking: Reported on 11/25/2017), Disp: 1 Inhaler, Rfl: 12 .  mometasone (NASONEX) 50 MCG/ACT nasal spray, Place 2 sprays into the nose daily. (Patient not taking: Reported on 11/25/2017), Disp: 17 g, Rfl: 12 .  Psyllium 28.3 % POWD, Take 500 mg by mouth daily. (Patient not taking: Reported on 10/28/2017), Disp: 283 g, Rfl:   Past Medical History: Past Medical History:  Diagnosis Date  . Anxiety and depression   . ASD (atrial septal defect), common atrium (single atrium)   . Heart murmur   . Heartburn   . Kidney stone     Tobacco Use: Social History   Tobacco Use  Smoking Status Current Every Day Smoker  . Packs/day: 0.50  . Types: Cigarettes  Smokeless Tobacco Current User  Tobacco Comment   Cutting down to five cigs a day 11/25/17    Labs: Recent Review Flowsheet Data    Labs for ITP Cardiac and Pulmonary Rehab Latest Ref Rng & Units 07/09/2009 11/26/2015 10/26/2017   Cholestrol 0 - 200 mg/dL - 214(H) 213(H)   LDLCALC 0 - 99 mg/dL - 128(H) 130(H)   LDLDIRECT mg/dL 130.4 - -   HDL >40 mg/dL - 63 49   Trlycerides <150 mg/dL - 115 172(H)   Hemoglobin A1c 4.8 - 5.6 % - - 5.4  Exercise Target Goals:    Exercise Program Goal: Individual exercise prescription set using results from initial 6 min walk test and THRR while considering  patient's activity barriers and safety.   Exercise Prescription Goal: Initial exercise prescription builds to 30-45 minutes a day of aerobic activity, 2-3 days per week.  Home exercise guidelines will be given to patient during program as part of exercise prescription that the participant will acknowledge.  Activity Barriers & Risk Stratification:   6 Minute Walk: 6 Minute Walk    Row Name 03/21/18 1623         6 Minute Walk   Distance  1400 feet     Walk Time  6 minutes     # of Rest Breaks  0     METS  3.66     RPE  12      Perceived Dyspnea   1     VO2 Peak  12.82     Symptoms  Yes (comment)     Comments  headache 2/10 - happens when she exerts - went away after walk test     Resting HR  87 bpm     Resting BP  116/62     Resting Oxygen Saturation   100 %     Exercise Oxygen Saturation  during 6 min walk  99 %     Max Ex. HR  100 bpm     Max Ex. BP  132/66     2 Minute Post BP  104/56        Oxygen Initial Assessment:   Oxygen Re-Evaluation:   Oxygen Discharge (Final Oxygen Re-Evaluation):   Initial Exercise Prescription: Initial Exercise Prescription - 03/21/18 1600      Date of Initial Exercise RX and Referring Provider   Date  03/21/18    Referring Provider  [name]      Treadmill   MPH  2.5    Grade  1.5    Minutes  15    METs  3.5      Recumbant Bike   Level  5    RPM  60    Watts  45    Minutes  15    METs  3.5      REL-XR   Level  3    Speed  50    Minutes  15    METs  3.5      Prescription Details   Frequency (times per week)  3    Duration  Progress to 45 minutes of aerobic exercise without signs/symptoms of physical distress      Intensity   THRR 40-80% of Max Heartrate  119-152    Ratings of Perceived Exertion  11-13    Perceived Dyspnea  0-4      Resistance Training   Training Prescription  Yes    Weight  3 lb    Reps  10-15       Perform Capillary Blood Glucose checks as needed.  Exercise Prescription Changes:   Exercise Comments: Exercise Comments    Row Name 03/21/18 1640           Exercise Comments   First full day of exercise!  Patient was oriented to gym and equipment including functions, settings, policies, and procedures.  Patient's individual exercise prescription and treatment plan were reviewed.  All starting workloads were established based on the results of the 6 minute walk test done at initial orientation visit.  The plan for exercise  progression was also introduced and progression will be customized based on patient's performance  and goals.          Exercise Goals and Review: Exercise Goals    Row Name 03/21/18 1623             Exercise Goals   Increase Physical Activity  Yes       Intervention  Provide advice, education, support and counseling about physical activity/exercise needs.;Develop an individualized exercise prescription for aerobic and resistive training based on initial evaluation findings, risk stratification, comorbidities and participant's personal goals.       Expected Outcomes  Short Term: Attend rehab on a regular basis to increase amount of physical activity.;Long Term: Add in home exercise to make exercise part of routine and to increase amount of physical activity.;Long Term: Exercising regularly at least 3-5 days a week.       Increase Strength and Stamina  Yes       Intervention  Provide advice, education, support and counseling about physical activity/exercise needs.;Develop an individualized exercise prescription for aerobic and resistive training based on initial evaluation findings, risk stratification, comorbidities and participant's personal goals.       Expected Outcomes  Short Term: Increase workloads from initial exercise prescription for resistance, speed, and METs.;Short Term: Perform resistance training exercises routinely during rehab and add in resistance training at home;Long Term: Improve cardiorespiratory fitness, muscular endurance and strength as measured by increased METs and functional capacity (6MWT)       Able to understand and use rate of perceived exertion (RPE) scale  Yes       Intervention  Provide education and explanation on how to use RPE scale       Expected Outcomes  Short Term: Able to use RPE daily in rehab to express subjective intensity level;Long Term:  Able to use RPE to guide intensity level when exercising independently       Able to understand and use Dyspnea scale  Yes       Intervention  Provide education and explanation on how to use Dyspnea scale        Expected Outcomes  Short Term: Able to use Dyspnea scale daily in rehab to express subjective sense of shortness of breath during exertion;Long Term: Able to use Dyspnea scale to guide intensity level when exercising independently       Knowledge and understanding of Target Heart Rate Range (THRR)  Yes       Intervention  Provide education and explanation of THRR including how the numbers were predicted and where they are located for reference       Expected Outcomes  Short Term: Able to state/look up THRR;Long Term: Able to use THRR to govern intensity when exercising independently;Short Term: Able to use daily as guideline for intensity in rehab       Able to check pulse independently  Yes       Intervention  Provide education and demonstration on how to check pulse in carotid and radial arteries.;Review the importance of being able to check your own pulse for safety during independent exercise       Expected Outcomes  Short Term: Able to explain why pulse checking is important during independent exercise;Long Term: Able to check pulse independently and accurately       Understanding of Exercise Prescription  Yes       Intervention  Provide education, explanation, and written materials on patient's individual exercise prescription  Expected Outcomes  Short Term: Able to explain program exercise prescription;Long Term: Able to explain home exercise prescription to exercise independently          Exercise Goals Re-Evaluation : Exercise Goals Re-Evaluation    Row Name 03/21/18 1640             Exercise Goal Re-Evaluation   Exercise Goals Review  Increase Physical Activity;Understanding of Exercise Prescription;Knowledge and understanding of Target Heart Rate Range (THRR);Able to understand and use rate of perceived exertion (RPE) scale       Comments  Reviewed RPE scale, THR and program prescription with pt today.  Pt voiced understanding and was given a copy of goals to take home.         Expected Outcomes  Short: Use RPE daily to regulate intensity.  Long: Follow program prescription in THR.          Discharge Exercise Prescription (Final Exercise Prescription Changes):   Nutrition:  Target Goals: Understanding of nutrition guidelines, daily intake of sodium <1562m, cholesterol <2055m calories 30% from fat and 7% or less from saturated fats, daily to have 5 or more servings of fruits and vegetables.  Biometrics: Pre Biometrics - 03/21/18 1622      Pre Biometrics   Height  5' 1.75" (1.568 m)    Weight  187 lb 11.2 oz (85.1 kg)    Waist Circumference  40 inches    Hip Circumference  49 inches    Waist to Hip Ratio  0.82 %    BMI (Calculated)  34.63        Nutrition Therapy Plan and Nutrition Goals:   Nutrition Assessments:   Nutrition Goals Re-Evaluation:   Nutrition Goals Discharge (Final Nutrition Goals Re-Evaluation):   Psychosocial: Target Goals: Acknowledge presence or absence of significant depression and/or stress, maximize coping skills, provide positive support system. Participant is able to verbalize types and ability to use techniques and skills needed for reducing stress and depression.   Initial Review & Psychosocial Screening:   Quality of Life Scores:  Quality of Life - 03/21/18 1844      Quality of Life Scores   Health/Function Pre  22.5 %    Socioeconomic Pre  24.29 %    Psych/Spiritual Pre  19.5 %    Family Pre  22.1 %    GLOBAL Pre  22.19 %      Scores of 19 and below usually indicate a poorer quality of life in these areas.  A difference of  2-3 points is a clinically meaningful difference.  A difference of 2-3 points in the total score of the Quality of Life Index has been associated with significant improvement in overall quality of life, self-image, physical symptoms, and general health in studies assessing change in quality of life.  PHQ-9: Recent Review Flowsheet Data    Depression screen [facility]/9 03/21/2018  11/25/2017 01/20/2016 12/12/2015 10/14/2015   Decreased Interest 1 2 0 0 0   Down, Depressed, Hopeless 1 3 0 0 0   PHQ - 2 Score 2 5 0 0 0   Altered sleeping 0 1 - - -   Tired, decreased energy 3 3 - - -   Change in appetite 0 3 - - -   Feeling bad or failure about yourself  0 0 - - -   Trouble concentrating 1 1 - - -   Moving slowly or fidgety/restless 0 0 - - -   Suicidal thoughts 0 0 - - -  PHQ-9 Score 6 13 - - -   Difficult doing work/chores Somewhat difficult Somewhat difficult - - -     Interpretation of Total Score  Total Score Depression Severity:  1-4 = Minimal depression, 5-9 = Mild depression, 10-14 = Moderate depression, 15-19 = Moderately severe depression, 20-27 = Severe depression   Psychosocial Evaluation and Intervention:   Psychosocial Re-Evaluation:   Psychosocial Discharge (Final Psychosocial Re-Evaluation):   Vocational Rehabilitation: Provide vocational rehab assistance to qualifying candidates.   Vocational Rehab Evaluation & Intervention: Vocational Rehab - 03/21/18 1844      Initial Vocational Rehab Evaluation & Intervention   Assessment shows need for Vocational Rehabilitation  No       Education: Education Goals: Education classes will be provided on a variety of topics geared toward better understanding of heart health and risk factor modification. Participant will state understanding/return demonstration of topics presented as noted by education test scores.  Learning Barriers/Preferences:   Education Topics:  AED/CPR: - Group verbal and written instruction with the use of models to demonstrate the basic use of the AED with the basic ABC's of resuscitation.   General Nutrition Guidelines/Fats and Fiber: -Group instruction provided by verbal, written material, models and posters to present the general guidelines for heart healthy nutrition. Gives an explanation and review of dietary fats and fiber.   Cardiac Rehab from 03/21/2018 in [facility]  Cardiac and Pulmonary Rehab  Date  03/21/18  Educator  PI  Instruction Review Code  1- Verbalizes Understanding      Controlling Sodium/Reading Food Labels: -Group verbal and written material supporting the discussion of sodium use in heart healthy nutrition. Review and explanation with models, verbal and written materials for utilization of the food label.   Exercise Physiology & General Exercise Guidelines: - Group verbal and written instruction with models to review the exercise physiology of the cardiovascular system and associated critical values. Provides general exercise guidelines with specific guidelines to those with heart or lung disease.    Aerobic Exercise & Resistance Training: - Gives group verbal and written instruction on the various components of exercise. Focuses on aerobic and resistive training programs and the benefits of this training and how to safely progress through these programs..   Flexibility, Balance, Mind/Body Relaxation: Provides group verbal/written instruction on the benefits of flexibility and balance training, including mind/body exercise modes such as yoga, pilates and tai chi.  Demonstration and skill practice provided.   Stress and Anxiety: - Provides group verbal and written instruction about the health risks of elevated stress and causes of high stress.  Discuss the correlation between heart/lung disease and anxiety and treatment options. Review healthy ways to manage with stress and anxiety.   Depression: - Provides group verbal and written instruction on the correlation between heart/lung disease and depressed mood, treatment options, and the stigmas associated with seeking treatment.   Anatomy & Physiology of the Heart: - Group verbal and written instruction and models provide basic cardiac anatomy and physiology, with the coronary electrical and arterial systems. Review of Valvular disease and Heart Failure   Cardiac Procedures: -  Group verbal and written instruction to review commonly prescribed medications for heart disease. Reviews the medication, class of the drug, and side effects. Includes the steps to properly store meds and maintain the prescription regimen. (beta blockers and nitrates)   Cardiac Medications I: - Group verbal and written instruction to review commonly prescribed medications for heart disease. Reviews the medication, class of the drug, and side effects.  Includes the steps to properly store meds and maintain the prescription regimen.   Cardiac Medications II: -Group verbal and written instruction to review commonly prescribed medications for heart disease. Reviews the medication, class of the drug, and side effects. (all other drug classes)    Go Sex-Intimacy & Heart Disease, Get SMART - Goal Setting: - Group verbal and written instruction through game format to discuss heart disease and the return to sexual intimacy. Provides group verbal and written material to discuss and apply goal setting through the application of the S.M.A.R.T. Method.   Other Matters of the Heart: - Provides group verbal, written materials and models to describe Stable Angina and Peripheral Artery. Includes description of the disease process and treatment options available to the cardiac patient.   Exercise & Equipment Safety: - Individual verbal instruction and demonstration of equipment use and safety with use of the equipment.   Cardiac Rehab from 03/21/2018 in [facility] Cardiac and Pulmonary Rehab  Date  11/25/17  Educator  mc  Instruction Review Code  1- Verbalizes Understanding      Infection Prevention: - Provides verbal and written material to individual with discussion of infection control including proper hand washing and proper equipment cleaning during exercise session.   Cardiac Rehab from 03/21/2018 in [facility] Cardiac and Pulmonary Rehab  Date  11/25/17  Educator  mc  Instruction Review Code  1- Verbalizes  Understanding      Falls Prevention: - Provides verbal and written material to individual with discussion of falls prevention and safety.   Cardiac Rehab from 03/21/2018 in [facility] Cardiac and Pulmonary Rehab  Date  11/25/17  Educator  mc  Instruction Review Code  1- Verbalizes Understanding      Diabetes: - Individual verbal and written instruction to review signs/symptoms of diabetes, desired ranges of glucose level fasting, after meals and with exercise. Acknowledge that pre and post exercise glucose checks will be done for 3 sessions at entry of program.   Know Your Numbers and Risk Factors: -Group verbal and written instruction about important numbers in your health.  Discussion of what are risk factors and how they play a role in the disease process.  Review of Cholesterol, Blood Pressure, Diabetes, and BMI and the role they play in your overall health.   Sleep Hygiene: -Provides group verbal and written instruction about how sleep can affect your health.  Define sleep hygiene, discuss sleep cycles and impact of sleep habits. Review good sleep hygiene tips.    Other: -Provides group and verbal instruction on various topics (see comments)   Knowledge Questionnaire Score:   Core Components/Risk Factors/Patient Goals at Admission:   Core Components/Risk Factors/Patient Goals Review:    Core Components/Risk Factors/Patient Goals at Discharge (Final Review):    ITP Comments: ITP Comments    Row Name 01/26/18 0630 03/23/18 0646         ITP Comments  30 Day review. Continue with ITP unless directed changes per Medical Director review.  no visits in Jan  30 Day review. Continue with ITP unless directed changes per Medical Director review.   Returns as new start after lengthy absence         Comments:

## 2018-03-24 DIAGNOSIS — I213 ST elevation (STEMI) myocardial infarction of unspecified site: Secondary | ICD-10-CM | POA: Diagnosis not present

## 2018-03-24 NOTE — Progress Notes (Signed)
Daily Session Note  Patient Details  Name: PAELYN SMICK MRN: [id number] Date of Birth: [birth year] Referring Provider:     Cardiac Rehab from 03/21/2018 in [place] Cardiac and Pulmonary Rehab  Referring Provider  [name]      Encounter Date: 03/24/2018  Check In: Session Check In - 03/24/18 1639      Check-In   Location  [facility]-Cardiac & Pulmonary Rehab    Staff Present  Justin Mend RCP,RRT,BSRT;Laureen Owens Shark, BS, RRT, Respiratory Therapist;Meredith Sherryll Burger, RN BSN    Supervising physician immediately available to respond to emergencies  See telemetry face sheet for immediately available ER MD    Medication changes reported      No    Fall or balance concerns reported     No    Tobacco Cessation  Use Decreased 3 cigs    Warm-up and Cool-down  Performed on first and last piece of equipment    Resistance Training Performed  Yes    VAD Patient?  No      Pain Assessment   Currently in Pain?  No/denies          Social History   Tobacco Use  Smoking Status Current Every Day Smoker  . Packs/day: 0.50  . Types: Cigarettes  Smokeless Tobacco Current User  Tobacco Comment   Cutting down to five cigs a day 11/25/17    Goals Met:  Proper associated with RPD/PD & O2 Sat Independence with exercise equipment Exercise tolerated well No report of cardiac concerns or symptoms Strength training completed today  Goals Unmet:  Not Applicable  Comments: Pt able to follow exercise prescription today without complaint.  Will continue to monitor for progression.   Dr. Emily Filbert is Medical Director for [place] and [facility].

## 2018-03-28 ENCOUNTER — Encounter: Payer: BC Managed Care – PPO | Attending: Internal Medicine | Admitting: *Deleted

## 2018-03-28 DIAGNOSIS — F17210 Nicotine dependence, cigarettes, uncomplicated: Secondary | ICD-10-CM | POA: Insufficient documentation

## 2018-03-28 DIAGNOSIS — I213 ST elevation (STEMI) myocardial infarction of unspecified site: Secondary | ICD-10-CM | POA: Diagnosis not present

## 2018-03-28 DIAGNOSIS — Z7982 Long term (current) use of aspirin: Secondary | ICD-10-CM | POA: Diagnosis not present

## 2018-03-28 DIAGNOSIS — F329 Major depressive disorder, single episode, unspecified: Secondary | ICD-10-CM | POA: Insufficient documentation

## 2018-03-28 DIAGNOSIS — Z79899 Other long term (current) drug therapy: Secondary | ICD-10-CM | POA: Diagnosis not present

## 2018-03-28 DIAGNOSIS — Z7902 Long term (current) use of antithrombotics/antiplatelets: Secondary | ICD-10-CM | POA: Insufficient documentation

## 2018-03-28 DIAGNOSIS — F419 Anxiety disorder, unspecified: Secondary | ICD-10-CM | POA: Insufficient documentation

## 2018-03-28 NOTE — Progress Notes (Signed)
Daily Session Note  Patient Details  Name: Angela Mccullough MRN: [id number] Date of Birth: Oct 11, 1966 Referring Provider:     Cardiac Rehab from 03/21/2018 in [facility] Cardiac and Pulmonary Rehab  Referring Provider  [name]      Encounter Date: 03/28/2018  Check In: Session Check In - 03/28/18 1624      Check-In   Location  [facility]-Cardiac & Pulmonary Rehab    Staff Present  Earlean Shawl, BS, ACSM CEP, Exercise Physiologist;Amanda Oletta Darter, BA, ACSM CEP, Exercise Physiologist;Carroll Enterkin, RN, BSN    Supervising physician immediately available to respond to emergencies  See telemetry face sheet for immediately available ER MD    Medication changes reported      No    Fall or balance concerns reported     No    Tobacco Cessation  No Change    Warm-up and Cool-down  Performed on first and last piece of equipment    Resistance Training Performed  Yes    VAD Patient?  No      Pain Assessment   Currently in Pain?  No/denies    Multiple Pain Sites  No          Social History   Tobacco Use  Smoking Status Current Every Day Smoker  . Packs/day: 0.50  . Types: Cigarettes  Smokeless Tobacco Current User  Tobacco Comment   Cutting down to five cigs a day 11/25/17    Goals Met:  Independence with exercise equipment Exercise tolerated well No report of cardiac concerns or symptoms Strength training completed today  Goals Unmet:  Not Applicable  Comments: Pt able to follow exercise prescription today without complaint.  Will continue to monitor for progression.    Dr. Emily Filbert is Medical Director for [place] and [facility].

## 2018-03-30 ENCOUNTER — Encounter: Payer: BC Managed Care – PPO | Admitting: *Deleted

## 2018-03-30 ENCOUNTER — Encounter: Payer: Self-pay | Admitting: *Deleted

## 2018-03-30 DIAGNOSIS — I213 ST elevation (STEMI) myocardial infarction of unspecified site: Secondary | ICD-10-CM

## 2018-03-30 NOTE — Progress Notes (Signed)
Daily Session Note  Patient Details  Name: Angela Mccullough MRN: [id number] Date of Birth: 1966/01/28 Referring Provider:     Cardiac Rehab from 03/21/2018 in [facility] Cardiac and Pulmonary Rehab  Referring Provider  [name]      Encounter Date: 03/30/2018  Check In: Session Check In - 03/30/18 1637      Check-In   Staff Present  Nada Maclachlan, BA, ACSM CEP, Exercise Physiologist;Shailey Butterbaugh, RN, BSN;Meredith Sherryll Burger, RN BSN    Supervising physician immediately available to respond to emergencies  See telemetry face sheet for immediately available ER MD    Medication changes reported      No    Fall or balance concerns reported     No    Tobacco Cessation  No Change    Warm-up and Cool-down  Performed on first and last piece of equipment    Resistance Training Performed  Yes    VAD Patient?  No      Pain Assessment   Currently in Pain?  No/denies        Exercise Prescription Changes - 03/30/18 1100      Response to Exercise   Blood Pressure (Admit)  118/68    Blood Pressure (Exercise)  138/70    Blood Pressure (Exit)  104/64    Heart Rate (Admit)  68 bpm    Heart Rate (Exercise)  112 bpm    Heart Rate (Exit)  85 bpm    Rating of Perceived Exertion (Exercise)  12    Symptoms  none    Duration  Progress to 45 minutes of aerobic exercise without signs/symptoms of physical distress    Intensity  THRR unchanged      Progression   Progression  Continue to progress workloads to maintain intensity without signs/symptoms of physical distress.    Average METs  3.33      Resistance Training   Training Prescription  Yes    Weight  3 lb    Reps  10-15      Treadmill   MPH  2.5    Grade  1.5    Minutes  15    METs  3.26      REL-XR   Level  3    Speed  50    Minutes  15    METs  3.4       Social History   Tobacco Use  Smoking Status Current Every Day Smoker  . Packs/day: 0.50  . Types: Cigarettes  Smokeless Tobacco Current User  Tobacco Comment   Cutting  down to five cigs a day 11/25/17    Goals Met:  Proper associated with RPD/PD & O2 Sat Independence with exercise equipment Exercise tolerated well No report of cardiac concerns or symptoms Strength training completed today  Goals Unmet:  Not Applicable  Comments:     Dr. Emily Filbert is Medical Director for [place] and [facility].

## 2018-04-06 ENCOUNTER — Encounter: Payer: BC Managed Care – PPO | Admitting: *Deleted

## 2018-04-06 DIAGNOSIS — I213 ST elevation (STEMI) myocardial infarction of unspecified site: Secondary | ICD-10-CM | POA: Diagnosis not present

## 2018-04-06 NOTE — Progress Notes (Signed)
Daily Session Note  Patient Details  Name: EMMRY HINSCH MRN: [id number] Date of Birth: 06-30-1966 Referring Provider:     Cardiac Rehab from 03/21/2018 in [facility] Cardiac and Pulmonary Rehab  Referring Provider  [name]      Encounter Date: 04/06/2018  Check In: Session Check In - 04/06/18 1649      Check-In   Location  [facility]-Cardiac & Pulmonary Rehab    Staff Present  Renita Papa, RN Vickki Hearing, BA, ACSM CEP, Exercise Physiologist;Carroll Enterkin, RN, BSN;Other [place] physician immediately available to respond to emergencies  See telemetry face sheet for immediately available ER MD    Medication changes reported      No    Fall or balance concerns reported     No    Warm-up and Cool-down  Performed on first and last piece of equipment    Resistance Training Performed  Yes    VAD Patient?  No      Pain Assessment   Currently in Pain?  No/denies          Social History   Tobacco Use  Smoking Status Current Every Day Smoker  . Packs/day: 0.50  . Types: Cigarettes  Smokeless Tobacco Current User  Tobacco Comment   Cutting down to five cigs a day 11/25/17    Goals Met:  Proper associated with RPD/PD & O2 Sat Independence with exercise equipment Exercise tolerated well No report of cardiac concerns or symptoms Strength training completed today  Goals Unmet:  Not Applicable  Comments: Pt able to follow exercise prescription today without complaint.  Will continue to monitor for progression.    Dr. Emily Filbert is Medical Director for [place] and [facility].

## 2018-04-07 DIAGNOSIS — I213 ST elevation (STEMI) myocardial infarction of unspecified site: Secondary | ICD-10-CM

## 2018-04-07 NOTE — Progress Notes (Signed)
Daily Session Note  Patient Details  Name: Angela Mccullough MRN: [id number] Date of Birth: 22-Mar-1966 Referring Provider:     Cardiac Rehab from 03/21/2018 in [facility] Cardiac and Pulmonary Rehab  Referring Provider  [name]      Encounter Date: 04/07/2018  Check In: Session Check In - 04/07/18 1630      Check-In   Location  [facility]-Cardiac & Pulmonary Rehab    Staff Present  Nada Maclachlan, BA, ACSM CEP, Exercise Physiologist;Meredith Sherryll Burger, RN BSN;Brooks Stotz Flavia Shipper    Supervising physician immediately available to respond to emergencies  See telemetry face sheet for immediately available ER MD    Medication changes reported      No    Fall or balance concerns reported     No    Tobacco Cessation  No Change 4 cigs    Warm-up and Cool-down  Performed on first and last piece of equipment    Resistance Training Performed  Yes    VAD Patient?  No      Pain Assessment   Currently in Pain?  No/denies          Social History   Tobacco Use  Smoking Status Current Every Day Smoker  . Packs/day: 0.50  . Types: Cigarettes  Smokeless Tobacco Current User  Tobacco Comment   Cutting down to five cigs a day 11/25/17    Goals Met:  Independence with exercise equipment Exercise tolerated well No report of cardiac concerns or symptoms Strength training completed today  Goals Unmet:  Not Applicable  Comments: Reviewed home exercise with pt today.  Pt plans to walk for exercise.  Reviewed THR, pulse, RPE, sign and symptoms, NTG use, and when to call 911 or MD.  Also discussed weather considerations and indoor options.  Pt voiced understanding.   Dr. Emily Filbert is Medical Director for [place] and [facility].

## 2018-04-11 DIAGNOSIS — I213 ST elevation (STEMI) myocardial infarction of unspecified site: Secondary | ICD-10-CM

## 2018-04-11 NOTE — Progress Notes (Signed)
Daily Session Note  Patient Details  Name: IEASHA BOEREMA MRN: [id number] Date of Birth: 05/14/1966 Referring Provider:     Cardiac Rehab from 03/21/2018 in [facility] Cardiac and Pulmonary Rehab  Referring Provider  [name]      Encounter Date: 04/11/2018  Check In: Session Check In - 04/11/18 1724      Check-In   Location  [facility]-Cardiac & Pulmonary Rehab    Staff Present  Earlean Shawl, BS, ACSM CEP, Exercise Physiologist;Amanda Oletta Darter, BA, ACSM CEP, Exercise Physiologist;Carroll Enterkin, RN, BSN    Supervising physician immediately available to respond to emergencies  See telemetry face sheet for immediately available ER MD    Medication changes reported      No    Fall or balance concerns reported     No    Warm-up and Cool-down  Performed on first and last piece of equipment    Resistance Training Performed  Yes    VAD Patient?  No      Pain Assessment   Currently in Pain?  No/denies    Multiple Pain Sites  No          Social History   Tobacco Use  Smoking Status Current Every Day Smoker  . Packs/day: 0.25  . Types: Cigarettes  Smokeless Tobacco Current User  Tobacco Comment   down to 4 cigs per day    Goals Met:  Independence with exercise equipment Exercise tolerated well No report of cardiac concerns or symptoms Strength training completed today  Goals Unmet:  Not Applicable  Comments: Pt able to follow exercise prescription today without complaint.  Will continue to monitor for progression.    Dr. Emily Filbert is Medical Director for [place] and [facility].

## 2018-04-20 ENCOUNTER — Encounter: Payer: Self-pay | Admitting: *Deleted

## 2018-04-20 ENCOUNTER — Encounter: Payer: Self-pay | Admitting: Dietician

## 2018-04-20 DIAGNOSIS — I213 ST elevation (STEMI) myocardial infarction of unspecified site: Secondary | ICD-10-CM

## 2018-04-20 NOTE — Progress Notes (Signed)
Cardiac Individual Treatment Plan  Patient Details  Name: Angela Mccullough MRN: [id number] Date of Birth: 27-Oct-1966 Referring Provider:     Cardiac Rehab from 03/21/2018 in [facility] Cardiac and Pulmonary Rehab  Referring Provider  [name]      Initial Encounter Date:    Cardiac Rehab from 03/21/2018 in [facility] Cardiac and Pulmonary Rehab  Date  03/21/18  Referring Provider  [name]      Visit Diagnosis: ST elevation myocardial infarction (STEMI), unspecified artery ([place])  Patient's Home Medications on Admission:  Current Outpatient Medications:  .  albuterol (PROVENTIL HFA;VENTOLIN HFA) 108 (90 BASE) MCG/ACT inhaler, Inhale 2 puffs into the lungs every 6 (six) hours as needed for wheezing or shortness of breath. (Patient not taking: Reported on 11/25/2017), Disp: 1 Inhaler, Rfl: 12 .  aspirin 81 MG chewable tablet, Chew 1 tablet (81 mg total) by mouth daily., Disp: 30 tablet, Rfl: 1 .  atorvastatin (LIPITOR) 80 MG tablet, Take 1 tablet (80 mg total) by mouth daily at 6 PM., Disp: 30 tablet, Rfl: 1 .  baclofen (LIORESAL) 10 MG tablet, Take 0.5-1 tablets (5-10 mg total) by mouth 3 (three) times daily as needed for muscle spasms., Disp: 30 each, Rfl: 1 .  carvedilol (COREG) 3.125 MG tablet, Take 1 tablet (3.125 mg total) by mouth 2 (two) times daily with a meal., Disp: 60 tablet, Rfl: 1 .  cetirizine (ZYRTEC) 10 MG tablet, Take 10 mg by mouth daily., Disp: , Rfl:  .  Cholecalciferol 2000 units CAPS, Take 1 capsule (2,000 Units total) by mouth daily. (Patient not taking: Reported on 11/25/2017), Disp: 30 each, Rfl:  .  citalopram (CELEXA) 20 MG tablet, Take 20 mg by mouth daily., Disp: , Rfl:  .  citalopram (CELEXA) 40 MG tablet, , Disp: , Rfl:  .  clopidogrel (PLAVIX) 75 MG tablet, Take 1 tablet (75 mg total) by mouth daily with breakfast., Disp: 30 tablet, Rfl: 1 .  diphenhydrAMINE (BENADRYL) 25 MG tablet, Take 25 mg by mouth every 4 (four) hours as needed., Disp: , Rfl:  .  fluticasone  (FLOVENT HFA) 110 MCG/ACT inhaler, Inhale 1 puff into the lungs 2 (two) times daily. (Patient not taking: Reported on 11/25/2017), Disp: 1 Inhaler, Rfl: 12 .  mometasone (NASONEX) 50 MCG/ACT nasal spray, Place 2 sprays into the nose daily. (Patient not taking: Reported on 11/25/2017), Disp: 17 g, Rfl: 12 .  Psyllium 28.3 % POWD, Take 500 mg by mouth daily. (Patient not taking: Reported on 10/28/2017), Disp: 283 g, Rfl:   Past Medical History: Past Medical History:  Diagnosis Date  . Anxiety and depression   . ASD (atrial septal defect), common atrium (single atrium)   . Heart murmur   . Heartburn   . Kidney stone     Tobacco Use: Social History   Tobacco Use  Smoking Status Current Every Day Smoker  . Packs/day: 0.25  . Types: Cigarettes  Smokeless Tobacco Current User  Tobacco Comment   down to 4 cigs per day    Labs: Recent Review Flowsheet Data    Labs for ITP Cardiac and Pulmonary Rehab Latest Ref Rng & Units 07/09/2009 11/26/2015 10/26/2017   Cholestrol 0 - 200 mg/dL - 214(H) 213(H)   LDLCALC 0 - 99 mg/dL - 128(H) 130(H)   LDLDIRECT mg/dL 130.4 - -   HDL >40 mg/dL - 63 49   Trlycerides <150 mg/dL - 115 172(H)   Hemoglobin A1c 4.8 - 5.6 % - - 5.4  Exercise Target Goals:    Exercise Program Goal: Individual exercise prescription set using results from initial 6 min walk test and THRR while considering  patient's activity barriers and safety.   Exercise Prescription Goal: Initial exercise prescription builds to 30-45 minutes a day of aerobic activity, 2-3 days per week.  Home exercise guidelines will be given to patient during program as part of exercise prescription that the participant will acknowledge.  Activity Barriers & Risk Stratification:   6 Minute Walk: 6 Minute Walk    Row Name 03/21/18 1623         6 Minute Walk   Distance  1400 feet     Walk Time  6 minutes     # of Rest Breaks  0     METS  3.66     RPE  12     Perceived Dyspnea   1      VO2 Peak  12.82     Symptoms  Yes (comment)     Comments  headache 2/10 - happens when she exerts - went away after walk test     Resting HR  87 bpm     Resting BP  116/62     Resting Oxygen Saturation   100 %     Exercise Oxygen Saturation  during 6 min walk  99 %     Max Ex. HR  100 bpm     Max Ex. BP  132/66     2 Minute Post BP  104/56        Oxygen Initial Assessment:   Oxygen Re-Evaluation:   Oxygen Discharge (Final Oxygen Re-Evaluation):   Initial Exercise Prescription: Initial Exercise Prescription - 03/21/18 1600      Date of Initial Exercise RX and Referring Provider   Date  03/21/18    Referring Provider  [name]      Treadmill   MPH  2.5    Grade  1.5    Minutes  15    METs  3.5      Recumbant Bike   Level  5    RPM  60    Watts  45    Minutes  15    METs  3.5      REL-XR   Level  3    Speed  50    Minutes  15    METs  3.5      Prescription Details   Frequency (times per week)  3    Duration  Progress to 45 minutes of aerobic exercise without signs/symptoms of physical distress      Intensity   THRR 40-80% of Max Heartrate  119-152    Ratings of Perceived Exertion  11-13    Perceived Dyspnea  0-4      Resistance Training   Training Prescription  Yes    Weight  3 lb    Reps  10-15       Perform Capillary Blood Glucose checks as needed.  Exercise Prescription Changes: Exercise Prescription Changes    Row Name 03/30/18 1100 04/07/18 1600 04/13/18 1400         Response to Exercise   Blood Pressure (Admit)  118/68  -  110/70     Blood Pressure (Exercise)  138/70  -  138/82     Blood Pressure (Exit)  104/64  -  122/62     Heart Rate (Admit)  68 bpm  -  80 bpm     Heart  Rate (Exercise)  112 bpm  -  124 bpm     Heart Rate (Exit)  85 bpm  -  82 bpm     Rating of Perceived Exertion (Exercise)  12  -  13     Symptoms  none  -  none     Duration  Progress to 45 minutes of aerobic exercise without signs/symptoms of physical distress   -  Continue with 45 min of aerobic exercise without signs/symptoms of physical distress.     Intensity  THRR unchanged  -  THRR unchanged       Progression   Progression  Continue to progress workloads to maintain intensity without signs/symptoms of physical distress.  -  Continue to progress workloads to maintain intensity without signs/symptoms of physical distress.     Average METs  3.33  -  3.68       Resistance Training   Training Prescription  Yes  -  Yes     Weight  3 lb  -  3 lb     Reps  10-15  -  10-15       Treadmill   MPH  2.5  -  2.5     Grade  1.5  -  1.5     Minutes  15  -  15     METs  3.26  -  3.26       REL-XR   Level  3  -  4     Speed  50  -  50     Minutes  15  -  15     METs  3.4  -  4.1       Home Exercise Plan   Plans to continue exercise at  -  [employer] (comment) [employer]  [place] (comment) [employer]     Frequency  -  Add 1 additional day to program exercise sessions.  Add 1 additional day to program exercise sessions.     Initial Home Exercises Provided  -  04/07/18  04/07/18        Exercise Comments: Exercise Comments    Row Name 03/21/18 1640 04/07/18 1645 04/07/18 1717       Exercise Comments   First full day of exercise!  Patient was oriented to gym and equipment including functions, settings, policies, and procedures.  Patient's individual exercise prescription and treatment plan were reviewed.  All starting workloads were established based on the results of the 6 minute walk test done at initial orientation visit.  The plan for exercise progression was also introduced and progression will be customized based on patient's performance and goals.  Reviewed home exercise with pt today.  Pt plans to walk for exercise.  Reviewed THR, pulse, RPE, sign and symptoms, NTG use, and when to call 911 or MD.  Also discussed weather considerations and indoor options.  Pt voiced understanding.  Shylyn has had some dull ache in her  chest and R arm since her procedure.  Her Dr has not expressed concern and it does not happen during exercise.  She was advised to call her Dr if it worsens.        Exercise Goals and Review: Exercise Goals    Row Name 03/21/18 1623             Exercise Goals   Increase Physical Activity  Yes       Intervention  Provide advice, education, support and counseling about  physical activity/exercise needs.;Develop an individualized exercise prescription for aerobic and resistive training based on initial evaluation findings, risk stratification, comorbidities and participant's personal goals.       Expected Outcomes  Short Term: Attend rehab on a regular basis to increase amount of physical activity.;Long Term: Add in home exercise to make exercise part of routine and to increase amount of physical activity.;Long Term: Exercising regularly at least 3-5 days a week.       Increase Strength and Stamina  Yes       Intervention  Provide advice, education, support and counseling about physical activity/exercise needs.;Develop an individualized exercise prescription for aerobic and resistive training based on initial evaluation findings, risk stratification, comorbidities and participant's personal goals.       Expected Outcomes  Short Term: Increase workloads from initial exercise prescription for resistance, speed, and METs.;Short Term: Perform resistance training exercises routinely during rehab and add in resistance training at home;Long Term: Improve cardiorespiratory fitness, muscular endurance and strength as measured by increased METs and functional capacity (6MWT)       Able to understand and use rate of perceived exertion (RPE) scale  Yes       Intervention  Provide education and explanation on how to use RPE scale       Expected Outcomes  Short Term: Able to use RPE daily in rehab to express subjective intensity level;Long Term:  Able to use RPE to guide intensity level when exercising  independently       Able to understand and use Dyspnea scale  Yes       Intervention  Provide education and explanation on how to use Dyspnea scale       Expected Outcomes  Short Term: Able to use Dyspnea scale daily in rehab to express subjective sense of shortness of breath during exertion;Long Term: Able to use Dyspnea scale to guide intensity level when exercising independently       Knowledge and understanding of Target Heart Rate Range (THRR)  Yes       Intervention  Provide education and explanation of THRR including how the numbers were predicted and where they are located for reference       Expected Outcomes  Short Term: Able to state/look up THRR;Long Term: Able to use THRR to govern intensity when exercising independently;Short Term: Able to use daily as guideline for intensity in rehab       Able to check pulse independently  Yes       Intervention  Provide education and demonstration on how to check pulse in carotid and radial arteries.;Review the importance of being able to check your own pulse for safety during independent exercise       Expected Outcomes  Short Term: Able to explain why pulse checking is important during independent exercise;Long Term: Able to check pulse independently and accurately       Understanding of Exercise Prescription  Yes       Intervention  Provide education, explanation, and written materials on patient's individual exercise prescription       Expected Outcomes  Short Term: Able to explain program exercise prescription;Long Term: Able to explain home exercise prescription to exercise independently          Exercise Goals Re-Evaluation : Exercise Goals Re-Evaluation    Row Name 03/21/18 1640 03/30/18 1206 04/07/18 1645 04/13/18 1413       Exercise Goal Re-Evaluation   Exercise Goals Review  Increase Physical Activity;Understanding of Exercise Prescription;Knowledge and understanding  of Target Heart Rate Range (THRR);Able to understand and use rate  of perceived exertion (RPE) scale  Increase Physical Activity;Increase Strength and Stamina;Able to understand and use rate of perceived exertion (RPE) scale;Knowledge and understanding of Target Heart Rate Range (THRR);Understanding of Exercise Prescription  Increase Physical Activity;Understanding of Exercise Prescription;Increase Strength and Stamina;Knowledge and understanding of Target Heart Rate Range (THRR)  Increase Physical Activity;Increase Strength and Stamina;Able to understand and use rate of perceived exertion (RPE) scale;Knowledge and understanding of Target Heart Rate Range (THRR);Understanding of Exercise Prescription    Comments  Reviewed RPE scale, THR and program prescription with pt today.  Pt voiced understanding and was given a copy of goals to take home.   Temica is not quite reaching her THR goals.  Staff will monitor as she is just starting back.  She has a positive attitude about working hard.  Reviewed home exercise with pt today.  Pt plans to walk for exercise.  Reviewed THR, pulse, RPE, sign and symptoms, NTG use, and when to call 911 or MD.  Also discussed weather considerations and indoor options.  Pt voiced understanding.  Abbigael is improving MET level slowly.  She has increased resistance on XR - staff wil suggest increase in DB weights and TM.     Expected Outcomes  Short: Use RPE daily to regulate intensity.  Long: Follow program prescription in THR.  Short - Mylin will attend class 3 days per week Long - Indria will complete HT and exercise on her own  Short - Katilynn will begin walking and monitoring pulse one day in addition to classes Long - [place] will exercise on her own  Short - Adalae will continue to attend regularly Long - Yuridia will maintain improved fitness on her own       Discharge Exercise Prescription (Final Exercise Prescription Changes): Exercise Prescription Changes - 04/13/18 1400      Response to Exercise   Blood Pressure (Admit)  110/70     Blood Pressure (Exercise)  138/82    Blood Pressure (Exit)  122/62    Heart Rate (Admit)  80 bpm    Heart Rate (Exercise)  124 bpm    Heart Rate (Exit)  82 bpm    Rating of Perceived Exertion (Exercise)  13    Symptoms  none    Duration  Continue with 45 min of aerobic exercise without signs/symptoms of physical distress.    Intensity  THRR unchanged      Progression   Progression  Continue to progress workloads to maintain intensity without signs/symptoms of physical distress.    Average METs  3.68      Resistance Training   Training Prescription  Yes    Weight  3 lb    Reps  10-15      Treadmill   MPH  2.5    Grade  1.5    Minutes  15    METs  3.26      REL-XR   Level  4    Speed  50    Minutes  15    METs  4.1      Home Exercise Plan   Plans to continue exercise at  [employer] (comment) [employer]    Frequency  Add 1 additional day to program exercise sessions.    Initial Home Exercises Provided  04/07/18       Nutrition:  Target Goals: Understanding of nutrition guidelines, daily intake of sodium <1517m, cholesterol <2091m calories 30% from  fat and 7% or less from saturated fats, daily to have 5 or more servings of fruits and vegetables.  Biometrics: Pre Biometrics - 03/21/18 1622      Pre Biometrics   Height  5' 1.75" (1.568 m)    Weight  187 lb 11.2 oz (85.1 kg)    Waist Circumference  40 inches    Hip Circumference  49 inches    Waist to Hip Ratio  0.82 %    BMI (Calculated)  34.63        Nutrition Therapy Plan and Nutrition Goals:   Nutrition Assessments:   Nutrition Goals Re-Evaluation: Nutrition Goals Re-Evaluation    [place] Name 04/07/18 1659             Goals   Current Weight  188 lb 9.6 oz (85.5 kg)       Nutrition Goal  Eat healthier and meet with the dietician       Comment  Naly wants to lose weight and eat a healthier diet. She has an appointment with the dietician on April 24th       Expected Outcome  Short: meet  with the dietician. Long: Adhere to a diet plan          Nutrition Goals Discharge (Final Nutrition Goals Re-Evaluation): Nutrition Goals Re-Evaluation - 04/07/18 1659      Goals   Current Weight  188 lb 9.6 oz (85.5 kg)    Nutrition Goal  Eat healthier and meet with the dietician    Comment  Elesia wants to lose weight and eat a healthier diet. She has an appointment with the dietician on April 24th    Expected Outcome  Short: meet with the dietician. Long: Adhere to a diet plan       Psychosocial: Target Goals: Acknowledge presence or absence of significant depression and/or stress, maximize coping skills, provide positive support system. Participant is able to verbalize types and ability to use techniques and skills needed for reducing stress and depression.   Initial Review & Psychosocial Screening:   Quality of Life Scores:  Quality of Life - 03/21/18 1844      Quality of Life Scores   Health/Function Pre  22.5 %    Socioeconomic Pre  24.29 %    Psych/Spiritual Pre  19.5 %    Family Pre  22.1 %    GLOBAL Pre  22.19 %      Scores of 19 and below usually indicate a poorer quality of life in these areas.  A difference of  2-3 points is a clinically meaningful difference.  A difference of 2-3 points in the total score of the Quality of Life Index has been associated with significant improvement in overall quality of life, self-image, physical symptoms, and general health in studies assessing change in quality of life.  PHQ-9: Recent Review Flowsheet Data    Depression screen [facility] 2/9 03/21/2018 11/25/2017 01/20/2016 12/12/2015 10/14/2015   Decreased Interest 1 2 0 0 0   Down, Depressed, Hopeless 1 3 0 0 0   PHQ - 2 Score 2 5 0 0 0   Altered sleeping 0 1 - - -   Tired, decreased energy 3 3 - - -   Change in appetite 0 3 - - -   Feeling bad or failure about yourself  0 0 - - -   Trouble concentrating 1 1 - - -   Moving slowly or fidgety/restless 0 0 - - -   Suicidal  thoughts 0 0 - - -   PHQ-9 Score 6 13 - - -   Difficult doing work/chores Somewhat difficult Somewhat difficult - - -     Interpretation of Total Score  Total Score Depression Severity:  1-4 = Minimal depression, 5-9 = Mild depression, 10-14 = Moderate depression, 15-19 = Moderately severe depression, 20-27 = Severe depression   Psychosocial Evaluation and Intervention: Psychosocial Evaluation - 03/28/18 1649      Psychosocial Evaluation & Interventions   Interventions  Encouraged to exercise with the program and follow exercise prescription;Stress management education;Relaxation education    Comments  Counselor met with Ms. Murdaugh Anne Ng) today for initial psychosocial evaluation.  She is a 52 year old who had a STEMI on 10/30 and subsequent stressors with the death of her father in 2023/11/20 preventing her from attending this program consistently earlier.  She has a strong support system with a significant other of 6 years; and an adult son and daughter who live locally.  [place] reports sleeping well and has a good appetite.  She admits to a history of of depression since she was a teenager and PTSD diagnosed ~3 years ago.  Her mood symptoms are managed by medication and she attends a PTSD support group weekly.  Ysela has had multiple stressors in the past 6 months with her own health issues; the death of her father; single parently and working 7 days/week.  Counselor spoke to her about making positive healthy changes; which are her goals and she is planning to discontinue the part-time job on the weekends in the near future.  Hermena has goals to increase her energy and improve her mood as well as learn ways to be healthier in general.  She will be followed by staff throughout the course of this program.      Expected Outcomes  Pakou will benefit from consistent exercise to achieve her stated goals.  Long - Exercise to increase energy and improve mood/manage stress.  Short - set healthier  limits with her work schedule to allow some "down time" and rest/refresh.    Continue Psychosocial Services   Follow up required by staff       Psychosocial Re-Evaluation: Psychosocial Re-Evaluation    [place] Name 04/07/18 1710             Psychosocial Re-Evaluation   Comments  No new stress - more settled since move is done       Expected Outcomes  Short - continue to attend class Long - manage stress on her own       Interventions  Encouraged to attend Cardiac Rehabilitation for the exercise       Continue Psychosocial Services   Follow up required by staff          Psychosocial Discharge (Final Psychosocial Re-Evaluation): Psychosocial Re-Evaluation - 04/07/18 1710      Psychosocial Re-Evaluation   Comments  No new stress - more settled since move is done    Expected Outcomes  Short - continue to attend class Long - manage stress on her own    Interventions  Encouraged to attend Cardiac Rehabilitation for the exercise    Continue Psychosocial Services   Follow up required by staff       Vocational Rehabilitation: Provide vocational rehab assistance to qualifying candidates.   Vocational Rehab Evaluation & Intervention: Vocational Rehab - 03/21/18 1844      Initial Vocational Rehab Evaluation & Intervention   Assessment shows need for Vocational Rehabilitation  No       Education: Education Goals: Education classes will be provided on a variety of topics geared toward better understanding of heart health and risk factor modification. Participant will state understanding/return demonstration of topics presented as noted by education test scores.  Learning Barriers/Preferences:   Education Topics:  AED/CPR: - Group verbal and written instruction with the use of models to demonstrate the basic use of the AED with the basic ABC's of resuscitation.   Cardiac Rehab from 04/11/2018 in [facility] Cardiac and Pulmonary Rehab  Date  03/30/18  Educator  CE  Instruction Review  Code  1- Verbalizes Understanding      General Nutrition Guidelines/Fats and Fiber: -Group instruction provided by verbal, written material, models and posters to present the general guidelines for heart healthy nutrition. Gives an explanation and review of dietary fats and fiber.   Cardiac Rehab from 04/11/2018 in [facility] Cardiac and Pulmonary Rehab  Date  03/21/18  Educator  PI  Instruction Review Code  1- Verbalizes Understanding      Controlling Sodium/Reading Food Labels: -Group verbal and written material supporting the discussion of sodium use in heart healthy nutrition. Review and explanation with models, verbal and written materials for utilization of the food label.   Cardiac Rehab from 04/11/2018 in [facility] Cardiac and Pulmonary Rehab  Date  03/28/18  Educator  PI  Instruction Review Code  1- Verbalizes Understanding      Exercise Physiology & General Exercise Guidelines: - Group verbal and written instruction with models to review the exercise physiology of the cardiovascular system and associated critical values. Provides general exercise guidelines with specific guidelines to those with heart or lung disease.    Cardiac Rehab from 04/11/2018 in [facility] Cardiac and Pulmonary Rehab  Date  04/06/18  Educator  AS  Instruction Review Code  1- Verbalizes Understanding      Aerobic Exercise & Resistance Training: - Gives group verbal and written instruction on the various components of exercise. Focuses on aerobic and resistive training programs and the benefits of this training and how to safely progress through these programs..   Cardiac Rehab from 04/11/2018 in [facility] Cardiac and Pulmonary Rehab  Date  04/11/18  Educator  [facility]  Instruction Review Code  1- Geologist, engineering, Balance, Mind/Body Relaxation: Provides group verbal/written instruction on the benefits of flexibility and balance training, including mind/body exercise modes such as yoga, pilates and  tai chi.  Demonstration and skill practice provided.   Stress and Anxiety: - Provides group verbal and written instruction about the health risks of elevated stress and causes of high stress.  Discuss the correlation between heart/lung disease and anxiety and treatment options. Review healthy ways to manage with stress and anxiety.   Depression: - Provides group verbal and written instruction on the correlation between heart/lung disease and depressed mood, treatment options, and the stigmas associated with seeking treatment.   Anatomy & Physiology of the Heart: - Group verbal and written instruction and models provide basic cardiac anatomy and physiology, with the coronary electrical and arterial systems. Review of Valvular disease and Heart Failure   Cardiac Procedures: - Group verbal and written instruction to review commonly prescribed medications for heart disease. Reviews the medication, class of the drug, and side effects. Includes the steps to properly store meds and maintain the prescription regimen. (beta blockers and nitrates)   Cardiac Medications I: - Group verbal and written instruction to review commonly prescribed medications for heart disease.  Reviews the medication, class of the drug, and side effects. Includes the steps to properly store meds and maintain the prescription regimen.   Cardiac Medications II: -Group verbal and written instruction to review commonly prescribed medications for heart disease. Reviews the medication, class of the drug, and side effects. (all other drug classes)    Go Sex-Intimacy & Heart Disease, Get SMART - Goal Setting: - Group verbal and written instruction through game format to discuss heart disease and the return to sexual intimacy. Provides group verbal and written material to discuss and apply goal setting through the application of the S.M.A.R.T. Method.   Other Matters of the Heart: - Provides group verbal, written materials and  models to describe Stable Angina and Peripheral Artery. Includes description of the disease process and treatment options available to the cardiac patient.   Exercise & Equipment Safety: - Individual verbal instruction and demonstration of equipment use and safety with use of the equipment.   Cardiac Rehab from 04/11/2018 in [facility] Cardiac and Pulmonary Rehab  Date  11/25/17  Educator  mc  Instruction Review Code  1- Verbalizes Understanding      Infection Prevention: - Provides verbal and written material to individual with discussion of infection control including proper hand washing and proper equipment cleaning during exercise session.   Cardiac Rehab from 04/11/2018 in [facility] Cardiac and Pulmonary Rehab  Date  11/25/17  Educator  mc  Instruction Review Code  1- Verbalizes Understanding      Falls Prevention: - Provides verbal and written material to individual with discussion of falls prevention and safety.   Cardiac Rehab from 04/11/2018 in [facility] Cardiac and Pulmonary Rehab  Date  11/25/17  Educator  mc  Instruction Review Code  1- Verbalizes Understanding      Diabetes: - Individual verbal and written instruction to review signs/symptoms of diabetes, desired ranges of glucose level fasting, after meals and with exercise. Acknowledge that pre and post exercise glucose checks will be done for 3 sessions at entry of program.   Know Your Numbers and Risk Factors: -Group verbal and written instruction about important numbers in your health.  Discussion of what are risk factors and how they play a role in the disease process.  Review of Cholesterol, Blood Pressure, Diabetes, and BMI and the role they play in your overall health.   Sleep Hygiene: -Provides group verbal and written instruction about how sleep can affect your health.  Define sleep hygiene, discuss sleep cycles and impact of sleep habits. Review good sleep hygiene tips.    Other: -Provides group and verbal instruction  on various topics (see comments)   Knowledge Questionnaire Score:   Core Components/Risk Factors/Patient Goals at Admission:   Core Components/Risk Factors/Patient Goals Review:  Goals and Risk Factor Review    Row Name 04/07/18 1708             Core Components/Risk Factors/Patient Goals Review   Personal Goals Review  Weight Management/Obesity;Stress;Tobacco Cessation;Hypertension;Lipids       Review  Kaleah is down to 3-4 cigarettes a day.  Her stress level is better since she is settled after moving.  She is watching sodium and eating more fruits and vegetables.  She has appt with RD in 2 weeks.       Expected Outcomes  Short - Keriann will continue to attend and modify diet to be more healthy [place] will exercise on her own and maintain healthy lifestyle changes  Core Components/Risk Factors/Patient Goals at Discharge (Final Review):  Goals and Risk Factor Review - 04/07/18 1708      Core Components/Risk Factors/Patient Goals Review   Personal Goals Review  Weight Management/Obesity;Stress;Tobacco Cessation;Hypertension;Lipids    Review  Jodeen is down to 3-4 cigarettes a day.  Her stress level is better since she is settled after moving.  She is watching sodium and eating more fruits and vegetables.  She has appt with RD in 2 weeks.    Expected Outcomes  Short - Amany will continue to attend and modify diet to be more healthy Long - Florenda will exercise on her own and maintain healthy lifestyle changes       ITP Comments: ITP Comments    Row Name 03/23/18 0646 04/20/18 0625         ITP Comments  30 Day review. Continue with ITP unless directed changes per Medical Director review.   Returns as new start after lengthy absence  30 day review. Continue with ITP unless directed changes per Medical Director         Comments:

## 2018-04-20 NOTE — Progress Notes (Signed)
Daily Session Note  Patient Details  Name: KIYONNA TORTORELLI MRN: [id number] Date of Birth: 1966/10/06 Referring Provider:     Cardiac Rehab from 03/21/2018 in [facility] Cardiac and Pulmonary Rehab  Referring Provider  [name]      Encounter Date: 04/20/2018  Check In: Session Check In - 04/20/18 1704      Check-In   Location  [facility]-Cardiac & Pulmonary Rehab    Staff Present  Renita Papa, RN Vickki Hearing, BA, ACSM CEP, Exercise Physiologist;Carroll Enterkin, RN, BSN    Supervising physician immediately available to respond to emergencies  See telemetry face sheet for immediately available ER MD    Medication changes reported      No    Fall or balance concerns reported     No    Tobacco Cessation  Use Increase 5    Warm-up and Cool-down  Performed on first and last piece of equipment    Resistance Training Performed  Yes          Social History   Tobacco Use  Smoking Status Current Every Day Smoker  . Packs/day: 0.25  . Types: Cigarettes  Smokeless Tobacco Current User  Tobacco Comment   down to 4 cigs per day    Goals Met:  Independence with exercise equipment Exercise tolerated well No report of cardiac concerns or symptoms Strength training completed today  Goals Unmet:  Not Applicable  Comments: Pt able to follow exercise prescription today without complaint.  Will continue to monitor for progression.    Dr. Emily Filbert is Medical Director for [facility] and [facility].

## 2018-04-21 DIAGNOSIS — I213 ST elevation (STEMI) myocardial infarction of unspecified site: Secondary | ICD-10-CM

## 2018-04-21 NOTE — Progress Notes (Signed)
Daily Session Note  Patient Details  Name: Angela Mccullough MRN: [id number] Date of Birth: 03/08/1966 Referring Provider:     Cardiac Rehab from 03/21/2018 in [facility] Cardiac and Pulmonary Rehab  Referring Provider  [name]      Encounter Date: 04/21/2018  Check In: Session Check In - 04/21/18 1621      Check-In   Location  [facility]-Cardiac & Pulmonary Rehab    Staff Present  Earlean Shawl, BS, ACSM CEP, Exercise Physiologist;Meredith Sherryll Burger, RN BSN;Joseph Flavia Shipper    Supervising physician immediately available to respond to emergencies  See telemetry face sheet for immediately available ER MD    Medication changes reported      No    Fall or balance concerns reported     No    Tobacco Cessation  No Change    Warm-up and Cool-down  Performed on first and last piece of equipment    Resistance Training Performed  Yes    VAD Patient?  No      Pain Assessment   Currently in Pain?  No/denies          Social History   Tobacco Use  Smoking Status Current Every Day Smoker  . Packs/day: 0.25  . Types: Cigarettes  Smokeless Tobacco Current User  Tobacco Comment   down to 4 cigs per day    Goals Met:  Independence with exercise equipment Exercise tolerated well No report of cardiac concerns or symptoms Strength training completed today  Goals Unmet:  Not Applicable  Comments: Pt able to follow exercise prescription today without complaint.  Will continue to monitor for progression.   Dr. Emily Filbert is Medical Director for [facility] and [facility].

## 2018-04-25 ENCOUNTER — Encounter: Payer: BC Managed Care – PPO | Admitting: *Deleted

## 2018-04-25 DIAGNOSIS — I213 ST elevation (STEMI) myocardial infarction of unspecified site: Secondary | ICD-10-CM

## 2018-04-25 NOTE — Progress Notes (Signed)
Daily Session Note  Patient Details  Name: Kellan E Hams MRN: [id number] Date of Birth: 08/05/1966 Referring Provider:     Cardiac Rehab from 03/21/2018 in [facility] Cardiac and Pulmonary Rehab  Referring Provider  Kowalski      Encounter Date: 04/25/2018  Check In: Session Check In - 04/25/18 1708      Check-In   Location  [facility]-Cardiac & Pulmonary Rehab    Staff Present  Meredith Craven, RN BSN; , BS, ACSM CEP, Exercise Physiologist;Susanne Bice, RN, BSN, CCRP;Carroll Enterkin, RN, BSN    Supervising physician immediately available to respond to emergencies  See telemetry face sheet for immediately available ER MD    Medication changes reported      No    Fall or balance concerns reported     No    Warm-up and Cool-down  Performed as group-led instruction    Resistance Training Performed  Yes    VAD Patient?  No      Pain Assessment   Currently in Pain?  No/denies          Social History   Tobacco Use  Smoking Status Current Every Day Smoker  . Packs/day: 0.25  . Types: Cigarettes  Smokeless Tobacco Current User  Tobacco Comment   down to 4 cigs per day    Goals Met:  Independence with exercise equipment Exercise tolerated well No report of cardiac concerns or symptoms Strength training completed today  Goals Unmet:  Not Applicable  Comments: Pt able to follow exercise prescription today without complaint.  Will continue to monitor for progression.    Dr. Mark Miller is Medical Director for [facility] and [facility]. 

## 2018-04-27 ENCOUNTER — Encounter: Payer: BC Managed Care – PPO | Attending: Internal Medicine | Admitting: *Deleted

## 2018-04-27 DIAGNOSIS — Z79899 Other long term (current) drug therapy: Secondary | ICD-10-CM | POA: Insufficient documentation

## 2018-04-27 DIAGNOSIS — F329 Major depressive disorder, single episode, unspecified: Secondary | ICD-10-CM | POA: Diagnosis not present

## 2018-04-27 DIAGNOSIS — F419 Anxiety disorder, unspecified: Secondary | ICD-10-CM | POA: Insufficient documentation

## 2018-04-27 DIAGNOSIS — F17210 Nicotine dependence, cigarettes, uncomplicated: Secondary | ICD-10-CM | POA: Insufficient documentation

## 2018-04-27 DIAGNOSIS — Z7982 Long term (current) use of aspirin: Secondary | ICD-10-CM | POA: Insufficient documentation

## 2018-04-27 DIAGNOSIS — I213 ST elevation (STEMI) myocardial infarction of unspecified site: Secondary | ICD-10-CM | POA: Diagnosis present

## 2018-04-27 DIAGNOSIS — Z7902 Long term (current) use of antithrombotics/antiplatelets: Secondary | ICD-10-CM | POA: Insufficient documentation

## 2018-04-27 NOTE — Progress Notes (Signed)
Daily Session Note  Patient Details  Name: Angela Mccullough MRN: [id number] Date of Birth: March 16, 1966 Referring Provider:     Cardiac Rehab from 03/21/2018 in [facility] Cardiac and Pulmonary Rehab  Referring Provider  [name]      Encounter Date: 04/27/2018  Check In: Session Check In - 04/27/18 1620      Check-In   Location  [facility]-Cardiac & Pulmonary Rehab    Staff Present  Renita Papa, RN Vickki Hearing, BA, ACSM CEP, Exercise Physiologist;Carroll Enterkin, RN, BSN    Supervising physician immediately available to respond to emergencies  See telemetry face sheet for immediately available ER MD    Medication changes reported      No    Fall or balance concerns reported     No    Tobacco Cessation  No Change    Warm-up and Cool-down  Performed on first and last piece of equipment    Resistance Training Performed  Yes    VAD Patient?  No      Pain Assessment   Currently in Pain?  No/denies        Exercise Prescription Changes - 04/27/18 1400      Response to Exercise   Blood Pressure (Admit)  134/68    Blood Pressure (Exercise)  152/72    Blood Pressure (Exit)  128/78    Heart Rate (Admit)  99 bpm    Heart Rate (Exercise)  128 bpm    Heart Rate (Exit)  88 bpm    Rating of Perceived Exertion (Exercise)  13    Symptoms  none    Duration  Continue with 45 min of aerobic exercise without signs/symptoms of physical distress.    Intensity  THRR unchanged      Progression   Progression  Continue to progress workloads to maintain intensity without signs/symptoms of physical distress.    Average METs  3.78      Resistance Training   Training Prescription  Yes    Weight  3 lb    Reps  10-15      Interval Training   Interval Training  No      Treadmill   MPH  2.5    Grade  3    Minutes  15    METs  3.6      Recumbant Bike   Level  5    RPM  60    Watts  44    Minutes  15    METs  3.6      Home Exercise Plan   Plans to continue exercise at  [employer] (comment) [employer]    Frequency  Add 1 additional day to program exercise sessions.    Initial Home Exercises Provided  04/07/18       Social History   Tobacco Use  Smoking Status Current Every Day Smoker  . Packs/day: 0.25  . Types: Cigarettes  Smokeless Tobacco Current User  Tobacco Comment   down to 4 cigs per day    Goals Met:  Independence with exercise equipment Exercise tolerated well No report of cardiac concerns or symptoms Strength training completed today  Goals Unmet:  Not Applicable  Comments: Pt able to follow exercise prescription today without complaint.  Will continue to monitor for progression.    Dr. Emily Filbert is Medical Director for [place] and [facility].

## 2018-05-02 DIAGNOSIS — I213 ST elevation (STEMI) myocardial infarction of unspecified site: Secondary | ICD-10-CM | POA: Diagnosis not present

## 2018-05-02 NOTE — Progress Notes (Signed)
Daily Session Note  Patient Details  Name: Angela Mccullough MRN: [id number] Date of Birth: March 17, 1966 Referring Provider:     Cardiac Rehab from 03/21/2018 in [facility] Cardiac and Pulmonary Rehab  Referring Provider  [name]      Encounter Date: 05/02/2018  Check In: Session Check In - 05/02/18 1610      Check-In   Location  [facility]-Cardiac & Pulmonary Rehab    Staff Present  Nada Maclachlan, BA, ACSM CEP, Exercise Physiologist;Carroll Enterkin, RN, Moises Blood, BS, ACSM CEP, Exercise Physiologist    Supervising physician immediately available to respond to emergencies  See telemetry face sheet for immediately available ER MD    Medication changes reported      No    Fall or balance concerns reported     No    Warm-up and Cool-down  Performed on first and last piece of equipment    Resistance Training Performed  Yes    VAD Patient?  No      Pain Assessment   Currently in Pain?  No/denies    Multiple Pain Sites  No          Social History   Tobacco Use  Smoking Status Current Every Day Smoker  . Packs/day: 0.25  . Types: Cigarettes  Smokeless Tobacco Current User  Tobacco Comment   down to 4 cigs per day    Goals Met:  Proper associated with RPD/PD & O2 Sat Independence with exercise equipment Exercise tolerated well Personal goals reviewed  Goals Unmet:  Not Applicable  Comments: Pt able to follow exercise prescription today without complaint.  Will continue to monitor for progression.    Dr. Emily Filbert is Medical Director for [place] and [facility].

## 2018-05-18 ENCOUNTER — Encounter: Payer: Self-pay | Admitting: *Deleted

## 2018-05-18 DIAGNOSIS — I213 ST elevation (STEMI) myocardial infarction of unspecified site: Secondary | ICD-10-CM

## 2018-05-18 NOTE — Progress Notes (Signed)
Cardiac Individual Treatment Plan  Patient Details  Name: Angela Mccullough MRN: [id number] Date of Birth: 27-Oct-1966 Referring Provider:     Cardiac Rehab from 03/21/2018 in [facility] Cardiac and Pulmonary Rehab  Referring Provider  [name]      Initial Encounter Date:    Cardiac Rehab from 03/21/2018 in [facility] Cardiac and Pulmonary Rehab  Date  03/21/18  Referring Provider  [name]      Visit Diagnosis: ST elevation myocardial infarction (STEMI), unspecified artery ([place])  Patient's Home Medications on Admission:  Current Outpatient Medications:  .  albuterol (PROVENTIL HFA;VENTOLIN HFA) 108 (90 BASE) MCG/ACT inhaler, Inhale 2 puffs into the lungs every 6 (six) hours as needed for wheezing or shortness of breath. (Patient not taking: Reported on 11/25/2017), Disp: 1 Inhaler, Rfl: 12 .  aspirin 81 MG chewable tablet, Chew 1 tablet (81 mg total) by mouth daily., Disp: 30 tablet, Rfl: 1 .  atorvastatin (LIPITOR) 80 MG tablet, Take 1 tablet (80 mg total) by mouth daily at 6 PM., Disp: 30 tablet, Rfl: 1 .  baclofen (LIORESAL) 10 MG tablet, Take 0.5-1 tablets (5-10 mg total) by mouth 3 (three) times daily as needed for muscle spasms., Disp: 30 each, Rfl: 1 .  carvedilol (COREG) 3.125 MG tablet, Take 1 tablet (3.125 mg total) by mouth 2 (two) times daily with a meal., Disp: 60 tablet, Rfl: 1 .  cetirizine (ZYRTEC) 10 MG tablet, Take 10 mg by mouth daily., Disp: , Rfl:  .  Cholecalciferol 2000 units CAPS, Take 1 capsule (2,000 Units total) by mouth daily. (Patient not taking: Reported on 11/25/2017), Disp: 30 each, Rfl:  .  citalopram (CELEXA) 20 MG tablet, Take 20 mg by mouth daily., Disp: , Rfl:  .  citalopram (CELEXA) 40 MG tablet, , Disp: , Rfl:  .  clopidogrel (PLAVIX) 75 MG tablet, Take 1 tablet (75 mg total) by mouth daily with breakfast., Disp: 30 tablet, Rfl: 1 .  diphenhydrAMINE (BENADRYL) 25 MG tablet, Take 25 mg by mouth every 4 (four) hours as needed., Disp: , Rfl:  .  fluticasone  (FLOVENT HFA) 110 MCG/ACT inhaler, Inhale 1 puff into the lungs 2 (two) times daily. (Patient not taking: Reported on 11/25/2017), Disp: 1 Inhaler, Rfl: 12 .  mometasone (NASONEX) 50 MCG/ACT nasal spray, Place 2 sprays into the nose daily. (Patient not taking: Reported on 11/25/2017), Disp: 17 g, Rfl: 12 .  Psyllium 28.3 % POWD, Take 500 mg by mouth daily. (Patient not taking: Reported on 10/28/2017), Disp: 283 g, Rfl:   Past Medical History: Past Medical History:  Diagnosis Date  . Anxiety and depression   . ASD (atrial septal defect), common atrium (single atrium)   . Heart murmur   . Heartburn   . Kidney stone     Tobacco Use: Social History   Tobacco Use  Smoking Status Current Every Day Smoker  . Packs/day: 0.25  . Types: Cigarettes  Smokeless Tobacco Current User  Tobacco Comment   down to 4 cigs per day    Labs: Recent Review Flowsheet Data    Labs for ITP Cardiac and Pulmonary Rehab Latest Ref Rng & Units 07/09/2009 11/26/2015 10/26/2017   Cholestrol 0 - 200 mg/dL - 214(H) 213(H)   LDLCALC 0 - 99 mg/dL - 128(H) 130(H)   LDLDIRECT mg/dL 130.4 - -   HDL >40 mg/dL - 63 49   Trlycerides <150 mg/dL - 115 172(H)   Hemoglobin A1c 4.8 - 5.6 % - - 5.4  Exercise Target Goals:    Exercise Program Goal: Individual exercise prescription set using results from initial 6 min walk test and THRR while considering  patient's activity barriers and safety.   Exercise Prescription Goal: Initial exercise prescription builds to 30-45 minutes a day of aerobic activity, 2-3 days per week.  Home exercise guidelines will be given to patient during program as part of exercise prescription that the participant will acknowledge.  Activity Barriers & Risk Stratification:   6 Minute Walk: 6 Minute Walk    Row Name 03/21/18 1623         6 Minute Walk   Distance  1400 feet     Walk Time  6 minutes     # of Rest Breaks  0     METS  3.66     RPE  12     Perceived Dyspnea   1      VO2 Peak  12.82     Symptoms  Yes (comment)     Comments  headache 2/10 - happens when she exerts - went away after walk test     Resting HR  87 bpm     Resting BP  116/62     Resting Oxygen Saturation   100 %     Exercise Oxygen Saturation  during 6 min walk  99 %     Max Ex. HR  100 bpm     Max Ex. BP  132/66     2 Minute Post BP  104/56        Oxygen Initial Assessment:   Oxygen Re-Evaluation:   Oxygen Discharge (Final Oxygen Re-Evaluation):   Initial Exercise Prescription: Initial Exercise Prescription - 03/21/18 1600      Date of Initial Exercise RX and Referring Provider   Date  03/21/18    Referring Provider  [name]      Treadmill   MPH  2.5    Grade  1.5    Minutes  15    METs  3.5      Recumbant Bike   Level  5    RPM  60    Watts  45    Minutes  15    METs  3.5      REL-XR   Level  3    Speed  50    Minutes  15    METs  3.5      Prescription Details   Frequency (times per week)  3    Duration  Progress to 45 minutes of aerobic exercise without signs/symptoms of physical distress      Intensity   THRR 40-80% of Max Heartrate  119-152    Ratings of Perceived Exertion  11-13    Perceived Dyspnea  0-4      Resistance Training   Training Prescription  Yes    Weight  3 lb    Reps  10-15       Perform Capillary Blood Glucose checks as needed.  Exercise Prescription Changes: Exercise Prescription Changes    Row Name 03/30/18 1100 04/07/18 1600 04/13/18 1400 04/27/18 1400       Response to Exercise   Blood Pressure (Admit)  118/68  -  110/70  134/68    Blood Pressure (Exercise)  138/70  -  138/82  152/72    Blood Pressure (Exit)  104/64  -  122/62  128/78    Heart Rate (Admit)  68 bpm  -  80 bpm  99  bpm    Heart Rate (Exercise)  112 bpm  -  124 bpm  128 bpm    Heart Rate (Exit)  85 bpm  -  82 bpm  88 bpm    Rating of Perceived Exertion (Exercise)  12  -  13  13    Symptoms  none  -  none  none    Duration  Progress to 45 minutes of  aerobic exercise without signs/symptoms of physical distress  -  Continue with 45 min of aerobic exercise without signs/symptoms of physical distress.  Continue with 45 min of aerobic exercise without signs/symptoms of physical distress.    Intensity  THRR unchanged  -  THRR unchanged  THRR unchanged      Progression   Progression  Continue to progress workloads to maintain intensity without signs/symptoms of physical distress.  -  Continue to progress workloads to maintain intensity without signs/symptoms of physical distress.  Continue to progress workloads to maintain intensity without signs/symptoms of physical distress.    Average METs  3.33  -  3.68  3.78      Resistance Training   Training Prescription  Yes  -  Yes  Yes    Weight  3 lb  -  3 lb  3 lb    Reps  10-15  -  10-15  10-15      Interval Training   Interval Training  -  -  -  No      Treadmill   MPH  2.5  -  2.5  2.5    Grade  1.5  -  1.5  3    Minutes  15  -  15  15    METs  3.26  -  3.26  3.6      Recumbant Bike   Level  -  -  -  5    RPM  -  -  -  60    Watts  -  -  -  44    Minutes  -  -  -  15    METs  -  -  -  3.6      REL-XR   Level  3  -  4  -    Speed  50  -  50  -    Minutes  15  -  15  -    METs  3.4  -  4.1  -      Home Exercise Plan   Plans to continue exercise at  -  [employer] (comment[facility] [employer]  [employer] [facility] [employer]  [facility]    Frequency  -  Add 1 additional day to program exercise sessions.  Add 1 additional day to program exercise sessions.  Add 1 additional day to program exercise sessions.    Initial Home Exercises Provided  -  04/07/18  04/07/18  04/07/18       Exercise Comments: Exercise Comments    Row Name 03/21/18 1640 04/07/18 1645 04/07/18 1717       Exercise Comments   First full day of exercise!  Patient was oriented to gym and equipment including functions, settings, policies, and procedures.  Patient's  individual exercise prescription and treatment plan were reviewed.  All starting workloads were established based on the results of the 6 minute walk test done at initial orientation visit.  The plan for exercise progression was also introduced and progression  will be customized based on patient's performance and goals.  Reviewed home exercise with pt today.  Pt plans to walk for exercise.  Reviewed THR, pulse, RPE, sign and symptoms, NTG use, and when to call 911 or MD.  Also discussed weather considerations and indoor options.  Pt voiced understanding.  Angela Mccullough has had some dull ache in her chest and R arm since her procedure.  Her Dr has not expressed concern and it does not happen during exercise.  She was advised to call her Dr if it worsens.        Exercise Goals and Review: Exercise Goals    Row Name 03/21/18 1623             Exercise Goals   Increase Physical Activity  Yes       Intervention  Provide advice, education, support and counseling about physical activity/exercise needs.;Develop an individualized exercise prescription for aerobic and resistive training based on initial evaluation findings, risk stratification, comorbidities and participant's personal goals.       Expected Outcomes  Short Term: Attend rehab on a regular basis to increase amount of physical activity.;Long Term: Add in home exercise to make exercise part of routine and to increase amount of physical activity.;Long Term: Exercising regularly at least 3-5 days a week.       Increase Strength and Stamina  Yes       Intervention  Provide advice, education, support and counseling about physical activity/exercise needs.;Develop an individualized exercise prescription for aerobic and resistive training based on initial evaluation findings, risk stratification, comorbidities and participant's personal goals.       Expected Outcomes  Short Term: Increase workloads from initial exercise prescription for resistance, speed, and  METs.;Short Term: Perform resistance training exercises routinely during rehab and add in resistance training at home;Long Term: Improve cardiorespiratory fitness, muscular endurance and strength as measured by increased METs and functional capacity (6MWT)       Able to understand and use rate of perceived exertion (RPE) scale  Yes       Intervention  Provide education and explanation on how to use RPE scale       Expected Outcomes  Short Term: Able to use RPE daily in rehab to express subjective intensity level;Long Term:  Able to use RPE to guide intensity level when exercising independently       Able to understand and use Dyspnea scale  Yes       Intervention  Provide education and explanation on how to use Dyspnea scale       Expected Outcomes  Short Term: Able to use Dyspnea scale daily in rehab to express subjective sense of shortness of breath during exertion;Long Term: Able to use Dyspnea scale to guide intensity level when exercising independently       Knowledge and understanding of Target Heart Rate Range (THRR)  Yes       Intervention  Provide education and explanation of THRR including how the numbers were predicted and where they are located for reference       Expected Outcomes  Short Term: Able to state/look up THRR;Long Term: Able to use THRR to govern intensity when exercising independently;Short Term: Able to use daily as guideline for intensity in rehab       Able to check pulse independently  Yes       Intervention  Provide education and demonstration on how to check pulse in carotid and radial arteries.;Review the importance of being able to  check your own pulse for safety during independent exercise       Expected Outcomes  Short Term: Able to explain why pulse checking is important during independent exercise;Long Term: Able to check pulse independently and accurately       Understanding of Exercise Prescription  Yes       Intervention  Provide education, explanation, and  written materials on patient's individual exercise prescription       Expected Outcomes  Short Term: Able to explain program exercise prescription;Long Term: Able to explain home exercise prescription to exercise independently          Exercise Goals Re-Evaluation : Exercise Goals Re-Evaluation    Row Name 03/21/18 1640 03/30/18 1206 04/07/18 1645 04/13/18 1413 04/27/18 1503     Exercise Goal Re-Evaluation   Exercise Goals Review  Increase Physical Activity;Understanding of Exercise Prescription;Knowledge and understanding of Target Heart Rate Range (THRR);Able to understand and use rate of perceived exertion (RPE) scale  Increase Physical Activity;Increase Strength and Stamina;Able to understand and use rate of perceived exertion (RPE) scale;Knowledge and understanding of Target Heart Rate Range (THRR);Understanding of Exercise Prescription  Increase Physical Activity;Understanding of Exercise Prescription;Increase Strength and Stamina;Knowledge and understanding of Target Heart Rate Range (THRR)  Increase Physical Activity;Increase Strength and Stamina;Able to understand and use rate of perceived exertion (RPE) scale;Knowledge and understanding of Target Heart Rate Range (THRR);Understanding of Exercise Prescription  Increase Physical Activity;Able to understand and use rate of perceived exertion (RPE) scale;Increase Strength and Stamina   Comments  Reviewed RPE scale, THR and program prescription with pt today.  Pt voiced understanding and was given a copy of goals to take home.   Angela Mccullough is not quite reaching her THR goals.  Staff will monitor as she is just starting back.  She has a positive attitude about working hard.  Reviewed home exercise with pt today.  Pt plans to walk for exercise.  Reviewed THR, pulse, RPE, sign and symptoms, NTG use, and when to call 911 or MD.  Also discussed weather considerations and indoor options.  Pt voiced understanding.  Angela Mccullough is improving MET level slowly.  She  has increased resistance on XR - staff wil suggest increase in DB weights and TM.   Angela Mccullough is making steady progress with exercise.  Staff will encourage interval training.   Expected Outcomes  Short: Use RPE daily to regulate intensity.  Long: Follow program prescription in THR.  Short - Rini will attend class 3 days per week Long - Michiko will complete HT and exercise on her own  Short - Ravin will begin walking and monitoring pulse one day in addition to classes Long - Laresa will exercise on her own  Short - Atalya will continue to attend regularly Long - Neilah will maintain improved fitness on her own  Short - Avalyn will add intervals trianing to her program Long - Dalissa will improve overall MET level      Discharge Exercise Prescription (Final Exercise Prescription Changes): Exercise Prescription Changes - 04/27/18 1400      Response to Exercise   Blood Pressure (Admit)  134/68    Blood Pressure (Exercise)  152/72    Blood Pressure (Exit)  128/78    Heart Rate (Admit)  99 bpm    Heart Rate (Exercise)  128 bpm    Heart Rate (Exit)  88 bpm    Rating of Perceived Exertion (Exercise)  13    Symptoms  none    Duration  Continue with 45  min of aerobic exercise without signs/symptoms of physical distress.    Intensity  THRR unchanged      Progression   Progression  Continue to progress workloads to maintain intensity without signs/symptoms of physical distress.    Average METs  3.78      Resistance Training   Training Prescription  Yes    Weight  3 lb    Reps  10-15      Interval Training   Interval Training  No      Treadmill   MPH  2.5    Grade  3    Minutes  15    METs  3.6      Recumbant Bike   Level  5    RPM  60    Watts  44    Minutes  15    METs  3.6      Home Exercise Plan   Plans to continue exercise at  [employer] (comment) [employer]    Frequency  Add 1 additional day to program exercise sessions.    Initial Home Exercises Provided   04/07/18       Nutrition:  Target Goals: Understanding of nutrition guidelines, daily intake of sodium <1560m, cholesterol <2016m calories 30% from fat and 7% or less from saturated fats, daily to have 5 or more servings of fruits and vegetables.  Biometrics: Pre Biometrics - 03/21/18 1622      Pre Biometrics   Height  5' 1.75" (1.568 m)    Weight  187 lb 11.2 oz (85.1 kg)    Waist Circumference  40 inches    Hip Circumference  49 inches    Waist to Hip Ratio  0.82 %    BMI (Calculated)  34.63        Nutrition Therapy Plan and Nutrition Goals: Nutrition Therapy & Goals - 04/20/18 0911      Nutrition Therapy   Diet  TLC    Drug/Food Interactions  Statins/Certain Fruits    Protein (specify units)  6-7oz    Fiber  25 grams    Whole Grain Foods  3 servings    Saturated Fats  11 max. grams    Fruits and Vegetables  5 servings/day 8 ideal    Sodium  2000 grams      Personal Nutrition Goals   Nutrition Goal  Decrease total calories coming from beverages, for example sweet tea and coffee. Choose low or zero calorie varieties    Personal Goal #2  Increase daily fiber intake to 25g/day to help lower cholesterol and increase satiety. Fruits, vegetables and whole grains all contain fiber    Personal Goal #3  Limit the amount of fried foods you eat. Choose vegetables or a baked potato as a side when eating out, and choose lean non-fried meats instead    Personal Goal #4  Use the nutrition facts label to identify good and bad fats, total fat, and sodium    Comments  She has been increasing her intake of vegetables. Eats out or chooses frozen meals daily for dinner but doest bring her lunch and breakfast to work daily.      Intervention Plan   Intervention  Prescribe, educate and counsel regarding individualized specific dietary modifications aiming towards targeted core components such as weight, hypertension, lipid management, diabetes, heart failure and other comorbidities.;Nutrition  handout(s) given to patient. Guidelines for Losing Weight handout & Goals    Expected Outcomes  Short Term Goal: Understand basic principles  of dietary content, such as calories, fat, sodium, cholesterol and nutrients.;Short Term Goal: A plan has been developed with personal nutrition goals set during dietitian appointment.;Long Term Goal: Adherence to prescribed nutrition plan.       Nutrition Assessments:   Nutrition Goals Re-Evaluation: Nutrition Goals Re-Evaluation    [place] Name 04/07/18 1659 04/20/18 0917 04/20/18 0918 05/02/18 1612       Goals   Current Weight  188 lb 9.6 oz (85.5 kg)  -  -  -    Nutrition Goal  Eat healthier and meet with the dietician  Decrease total calories coming from beverages, for example sweet tea and coffee. Choose low or zero calorie varieties  -  Pt states she is drinking more water, having fish twice per week and avoiding fried foods.  No change in weight yet - will continue exercise and eating medittearanean diet as recommended by RD.      Comment  Angela Mccullough wants to lose weight and eat a healthier diet. She has an appointment with the dietician on April 24th  She currently drinks at least 3 cups of coffee/day with creamer. Drinks sweet tea frequently with dinner.  -  -    Expected Outcome  Short: meet with the dietician. Long: Adhere to a diet plan  She will consume less calories through beverages each day  -  Short - Pt will continue to follow RD recommendation Long - Pt will reach goal weight      Personal Goal #2 Re-Evaluation   Personal Goal #2  -  -  Increase daily fiber intake to 25g/ay to help lower cholesterol and increase satiety. Fruits, vegetables, and whole grains all contain fiber  -      Personal Goal #3 Re-Evaluation   Personal Goal #3  -  -  Limit the amount of fried foods you eat. Choose vegetables or a baked potato as a side when eating out, and choose lan non-fried meats instead  -      Personal Goal #4 Re-Evaluation   Personal Goal #4  -   -  Use the nutrition facts label to identify good and bad fats, total fat and sodium  -       Nutrition Goals Discharge (Final Nutrition Goals Re-Evaluation): Nutrition Goals Re-Evaluation - 05/02/18 1612      Goals   Nutrition Goal  Pt states she is drinking more water, having fish twice per week and avoiding fried foods.  No change in weight yet - will continue exercise and eating medittearanean diet as recommended by RD.      Expected Outcome  Short - Pt will continue to follow RD recommendation Long - Pt will reach goal weight       Psychosocial: Target Goals: Acknowledge presence or absence of significant depression and/or stress, maximize coping skills, provide positive support system. Participant is able to verbalize types and ability to use techniques and skills needed for reducing stress and depression.   Initial Review & Psychosocial Screening:   Quality of Life Scores:  Quality of Life - 03/21/18 1844      Quality of Life Scores   Health/Function Pre  22.5 %    Socioeconomic Pre  24.29 %    Psych/Spiritual Pre  19.5 %    Family Pre  22.1 %    GLOBAL Pre  22.19 %      Scores of 19 and below usually indicate a poorer quality of life in these areas.  A difference of  2-3 points is a clinically meaningful difference.  A difference of 2-3 points in the total score of the Quality of Life Index has been associated with significant improvement in overall quality of life, self-image, physical symptoms, and general health in studies assessing change in quality of life.  PHQ-9: Recent Review Flowsheet Data    Depression screen [facility] 2/9 03/21/2018 11/25/2017 01/20/2016 12/12/2015 10/14/2015   Decreased Interest 1 2 0 0 0   Down, Depressed, Hopeless 1 3 0 0 0   PHQ - 2 Score 2 5 0 0 0   Altered sleeping 0 1 - - -   Tired, decreased energy 3 3 - - -   Change in appetite 0 3 - - -   Feeling bad or failure about yourself  0 0 - - -   Trouble concentrating 1 1 - - -   Moving slowly  or fidgety/restless 0 0 - - -   Suicidal thoughts 0 0 - - -   PHQ-9 Score 6 13 - - -   Difficult doing work/chores Somewhat difficult Somewhat difficult - - -     Interpretation of Total Score  Total Score Depression Severity:  1-4 = Minimal depression, 5-9 = Mild depression, 10-14 = Moderate depression, 15-19 = Moderately severe depression, 20-27 = Severe depression   Psychosocial Evaluation and Intervention: Psychosocial Evaluation - 03/28/18 1649      Psychosocial Evaluation & Interventions   Interventions  Encouraged to exercise with the program and follow exercise prescription;Stress management education;Relaxation education    Comments  Counselor met with Ms. Moline Anne Ng) today for initial psychosocial evaluation.  She is a 52 year old who had a STEMI on 10/30 and subsequent stressors with the death of her father in December 06, 2023 preventing her from attending this program consistently earlier.  She has a strong support system with a significant other of 6 years; and an adult son and daughter who live locally.  Zamani reports sleeping well and has a good appetite.  She admits to a history of of depression since she was a teenager and PTSD diagnosed ~3 years ago.  Her mood symptoms are managed by medication and she attends a PTSD support group weekly.  Angela Mccullough has had multiple stressors in the past 6 months with her own health issues; the death of her father; single parently and working 7 days/week.  Counselor spoke to her about making positive healthy changes; which are her goals and she is planning to discontinue the part-time job on the weekends in the near future.  Bowen has goals to increase her energy and improve her mood as well as learn ways to be healthier in general.  She will be followed by staff throughout the course of this program.      Expected Outcomes  Angela Mccullough will benefit from consistent exercise to achieve her stated goals.  Long - Exercise to increase energy and improve  mood/manage stress.  Short - set healthier limits with her work schedule to allow some "down time" and rest/refresh.    Continue Psychosocial Services   Follow up required by staff       Psychosocial Re-Evaluation: Psychosocial Re-Evaluation    [place] Name 04/07/18 1710             Psychosocial Re-Evaluation   Comments  No new stress - more settled since move is done       Expected Outcomes  Short - continue to attend class Long - manage stress on her own  Interventions  Encouraged to attend Cardiac Rehabilitation for the exercise       Continue Psychosocial Services   Follow up required by staff          Psychosocial Discharge (Final Psychosocial Re-Evaluation): Psychosocial Re-Evaluation - 04/07/18 1710      Psychosocial Re-Evaluation   Comments  No new stress - more settled since move is done    Expected Outcomes  Short - continue to attend class Long - manage stress on her own    Interventions  Encouraged to attend Cardiac Rehabilitation for the exercise    Continue Psychosocial Services   Follow up required by staff       Vocational Rehabilitation: Provide vocational rehab assistance to qualifying candidates.   Vocational Rehab Evaluation & Intervention: Vocational Rehab - 03/21/18 1844      Initial Vocational Rehab Evaluation & Intervention   Assessment shows need for Vocational Rehabilitation  No       Education: Education Goals: Education classes will be provided on a variety of topics geared toward better understanding of heart health and risk factor modification. Participant will state understanding/return demonstration of topics presented as noted by education test scores.  Learning Barriers/Preferences:   Education Topics:  AED/CPR: - Group verbal and written instruction with the use of models to demonstrate the basic use of the AED with the basic ABC's of resuscitation.   Cardiac Rehab from 04/27/2018 in [facility] Cardiac and Pulmonary Rehab  Date   03/30/18  Educator  CE  Instruction Review Code  1- Verbalizes Understanding      General Nutrition Guidelines/Fats and Fiber: -Group instruction provided by verbal, written material, models and posters to present the general guidelines for heart healthy nutrition. Gives an explanation and review of dietary fats and fiber.   Cardiac Rehab from 04/27/2018 in [facility] Cardiac and Pulmonary Rehab  Date  03/21/18  Educator  PI  Instruction Review Code  1- Verbalizes Understanding      Controlling Sodium/Reading Food Labels: -Group verbal and written material supporting the discussion of sodium use in heart healthy nutrition. Review and explanation with models, verbal and written materials for utilization of the food label.   Cardiac Rehab from 04/27/2018 in [facility] Cardiac and Pulmonary Rehab  Date  03/28/18  Educator  PI  Instruction Review Code  1- Verbalizes Understanding      Exercise Physiology & General Exercise Guidelines: - Group verbal and written instruction with models to review the exercise physiology of the cardiovascular system and associated critical values. Provides general exercise guidelines with specific guidelines to those with heart or lung disease.    Cardiac Rehab from 04/27/2018 in [facility] Cardiac and Pulmonary Rehab  Date  04/06/18  Educator  AS  Instruction Review Code  1- Verbalizes Understanding      Aerobic Exercise & Resistance Training: - Gives group verbal and written instruction on the various components of exercise. Focuses on aerobic and resistive training programs and the benefits of this training and how to safely progress through these programs..   Cardiac Rehab from 04/27/2018 in [facility] Cardiac and Pulmonary Rehab  Date  04/11/18  Educator  [facility]  Instruction Review Code  1- Geologist, engineering, Balance, Mind/Body Relaxation: Provides group verbal/written instruction on the benefits of flexibility and balance training, including mind/body  exercise modes such as yoga, pilates and tai chi.  Demonstration and skill practice provided.   Stress and Anxiety: - Provides group verbal and written instruction about the  health risks of elevated stress and causes of high stress.  Discuss the correlation between heart/lung disease and anxiety and treatment options. Review healthy ways to manage with stress and anxiety.   Cardiac Rehab from 04/27/2018 in [facility] Cardiac and Pulmonary Rehab  Date  04/27/18  Educator  [facility]  Instruction Review Code  1- Verbalizes Understanding      Depression: - Provides group verbal and written instruction on the correlation between heart/lung disease and depressed mood, treatment options, and the stigmas associated with seeking treatment.   Anatomy & Physiology of the Heart: - Group verbal and written instruction and models provide basic cardiac anatomy and physiology, with the coronary electrical and arterial systems. Review of Valvular disease and Heart Failure   Cardiac Procedures: - Group verbal and written instruction to review commonly prescribed medications for heart disease. Reviews the medication, class of the drug, and side effects. Includes the steps to properly store meds and maintain the prescription regimen. (beta blockers and nitrates)   Cardiac Medications I: - Group verbal and written instruction to review commonly prescribed medications for heart disease. Reviews the medication, class of the drug, and side effects. Includes the steps to properly store meds and maintain the prescription regimen.   Cardiac Rehab from 05/02/2018 in [facility] Cardiac and Pulmonary Rehab  Date  05/02/18  Educator  CE  Instruction Review Code  1- Verbalizes Understanding      Cardiac Medications II: -Group verbal and written instruction to review commonly prescribed medications for heart disease. Reviews the medication, class of the drug, and side effects. (all other drug classes)   Cardiac Rehab from 04/27/2018 in  [facility] Cardiac and Pulmonary Rehab  Date  04/20/18  Educator  [facility]  Instruction Review Code  1- Verbalizes Understanding       Go Sex-Intimacy & Heart Disease, Get SMART - Goal Setting: - Group verbal and written instruction through game format to discuss heart disease and the return to sexual intimacy. Provides group verbal and written material to discuss and apply goal setting through the application of the S.M.A.R.T. Method.   Other Matters of the Heart: - Provides group verbal, written materials and models to describe Stable Angina and Peripheral Artery. Includes description of the disease process and treatment options available to the cardiac patient.   Exercise & Equipment Safety: - Individual verbal instruction and demonstration of equipment use and safety with use of the equipment.   Cardiac Rehab from 04/27/2018 in [facility] Cardiac and Pulmonary Rehab  Date  11/25/17  Educator  mc  Instruction Review Code  1- Verbalizes Understanding      Infection Prevention: - Provides verbal and written material to individual with discussion of infection control including proper hand washing and proper equipment cleaning during exercise session.   Cardiac Rehab from 04/27/2018 in [facility] Cardiac and Pulmonary Rehab  Date  11/25/17  Educator  mc  Instruction Review Code  1- Verbalizes Understanding      Falls Prevention: - Provides verbal and written material to individual with discussion of falls prevention and safety.   Cardiac Rehab from 04/27/2018 in [facility] Cardiac and Pulmonary Rehab  Date  11/25/17  Educator  mc  Instruction Review Code  1- Verbalizes Understanding      Diabetes: - Individual verbal and written instruction to review signs/symptoms of diabetes, desired ranges of glucose level fasting, after meals and with exercise. Acknowledge that pre and post exercise glucose checks will be done for 3 sessions at entry of program.  Know Your Numbers and Risk Factors: -Group verbal and  written instruction about important numbers in your health.  Discussion of what are risk factors and how they play a role in the disease process.  Review of Cholesterol, Blood Pressure, Diabetes, and BMI and the role they play in your overall health.   Cardiac Rehab from 04/27/2018 in [facility] Cardiac and Pulmonary Rehab  Date  04/20/18  Educator  [facility]  Instruction Review Code  1- Verbalizes Understanding      Sleep Hygiene: -Provides group verbal and written instruction about how sleep can affect your health.  Define sleep hygiene, discuss sleep cycles and impact of sleep habits. Review good sleep hygiene tips.    Other: -Provides group and verbal instruction on various topics (see comments)   Knowledge Questionnaire Score:   Core Components/Risk Factors/Patient Goals at Admission:   Core Components/Risk Factors/Patient Goals Review:  Goals and Risk Factor Review    Row Name 04/07/18 1708 05/02/18 1616           Core Components/Risk Factors/Patient Goals Review   Personal Goals Review  Weight Management/Obesity;Stress;Tobacco Cessation;Hypertension;Lipids  Weight Management/Obesity;Lipids;Hypertension;Stress;Tobacco Cessation      Review  Angela Mccullough is down to 3-4 cigarettes a day.  Her stress level is better since she is settled after moving.  She is watching sodium and eating more fruits and vegetables.  She has appt with RD in 2 weeks.  Pt reports taking all meds as directed.  BP has been normal during HT classes.  She met with RD last week for dietary changes.  She is meeting with Juliann Pulse today about stress.      Expected Outcomes  Short - Hisae will continue to attend and modify diet to be more healthy [place] will exercise on her own and maintain healthy lifestyle changes  Short - Pt will continue to exercise to help manage stress and keep heart healthy .  She is still smiking 3 cigarettes per day  Long - Pt will quit smoking and will reach weight goals, maintain exercise on her  own         Core Components/Risk Factors/Patient Goals at Discharge (Final Review):  Goals and Risk Factor Review - 05/02/18 1616      Core Components/Risk Factors/Patient Goals Review   Personal Goals Review  Weight Management/Obesity;Lipids;Hypertension;Stress;Tobacco Cessation    Review  Pt reports taking all meds as directed.  BP has been normal during HT classes.  She met with RD last week for dietary changes.  She is meeting with Juliann Pulse today about stress.    Expected Outcomes  Short - Pt will continue to exercise to help manage stress and keep heart healthy .  She is still smiking 3 cigarettes per day  Long - Pt will quit smoking and will reach weight goals, maintain exercise on her own       ITP Comments: ITP Comments    Row Name 03/23/18 0646 04/20/18 0625 04/25/18 1708 05/18/18 0628     ITP Comments  30 Day review. Continue with ITP unless directed changes per Medical Director review.   Returns as new start after lengthy absence  30 day review. Continue with ITP unless directed changes per Medical Director  Britni reports a spider bite that is being treated with antibiotics  30 day review. Continue with ITP unless directed changes per Medical Director  Last visit 05/02/2018       Comments:

## 2018-05-25 ENCOUNTER — Telehealth: Payer: Self-pay

## 2018-05-25 NOTE — Telephone Encounter (Signed)
LMOM

## 2018-05-30 ENCOUNTER — Encounter: Payer: BC Managed Care – PPO | Attending: Internal Medicine

## 2018-05-30 DIAGNOSIS — Z7982 Long term (current) use of aspirin: Secondary | ICD-10-CM | POA: Insufficient documentation

## 2018-05-30 DIAGNOSIS — F17210 Nicotine dependence, cigarettes, uncomplicated: Secondary | ICD-10-CM | POA: Insufficient documentation

## 2018-05-30 DIAGNOSIS — Z7902 Long term (current) use of antithrombotics/antiplatelets: Secondary | ICD-10-CM | POA: Insufficient documentation

## 2018-05-30 DIAGNOSIS — I213 ST elevation (STEMI) myocardial infarction of unspecified site: Secondary | ICD-10-CM | POA: Insufficient documentation

## 2018-05-30 DIAGNOSIS — F419 Anxiety disorder, unspecified: Secondary | ICD-10-CM | POA: Insufficient documentation

## 2018-05-30 DIAGNOSIS — Z79899 Other long term (current) drug therapy: Secondary | ICD-10-CM | POA: Insufficient documentation

## 2018-05-30 DIAGNOSIS — F329 Major depressive disorder, single episode, unspecified: Secondary | ICD-10-CM | POA: Insufficient documentation

## 2018-06-07 ENCOUNTER — Telehealth: Payer: Self-pay

## 2018-06-07 NOTE — Telephone Encounter (Signed)
LMOM to call back

## 2018-06-15 ENCOUNTER — Encounter: Payer: Self-pay | Admitting: *Deleted

## 2018-06-15 DIAGNOSIS — I213 ST elevation (STEMI) myocardial infarction of unspecified site: Secondary | ICD-10-CM

## 2018-06-15 NOTE — Progress Notes (Signed)
Cardiac Individual Treatment Plan  Patient Details  Name: Angela Mccullough MRN: [id number] Date of Birth: 27-Oct-1966 Referring Provider:     Cardiac Rehab from 03/21/2018 in [facility] Cardiac and Pulmonary Rehab  Referring Provider  [name]      Initial Encounter Date:    Cardiac Rehab from 03/21/2018 in [facility] Cardiac and Pulmonary Rehab  Date  03/21/18  Referring Provider  [name]      Visit Diagnosis: ST elevation myocardial infarction (STEMI), unspecified artery ([place])  Patient's Home Medications on Admission:  Current Outpatient Medications:  .  albuterol (PROVENTIL HFA;VENTOLIN HFA) 108 (90 BASE) MCG/ACT inhaler, Inhale 2 puffs into the lungs every 6 (six) hours as needed for wheezing or shortness of breath. (Patient not taking: Reported on 11/25/2017), Disp: 1 Inhaler, Rfl: 12 .  aspirin 81 MG chewable tablet, Chew 1 tablet (81 mg total) by mouth daily., Disp: 30 tablet, Rfl: 1 .  atorvastatin (LIPITOR) 80 MG tablet, Take 1 tablet (80 mg total) by mouth daily at 6 PM., Disp: 30 tablet, Rfl: 1 .  baclofen (LIORESAL) 10 MG tablet, Take 0.5-1 tablets (5-10 mg total) by mouth 3 (three) times daily as needed for muscle spasms., Disp: 30 each, Rfl: 1 .  carvedilol (COREG) 3.125 MG tablet, Take 1 tablet (3.125 mg total) by mouth 2 (two) times daily with a meal., Disp: 60 tablet, Rfl: 1 .  cetirizine (ZYRTEC) 10 MG tablet, Take 10 mg by mouth daily., Disp: , Rfl:  .  Cholecalciferol 2000 units CAPS, Take 1 capsule (2,000 Units total) by mouth daily. (Patient not taking: Reported on 11/25/2017), Disp: 30 each, Rfl:  .  citalopram (CELEXA) 20 MG tablet, Take 20 mg by mouth daily., Disp: , Rfl:  .  citalopram (CELEXA) 40 MG tablet, , Disp: , Rfl:  .  clopidogrel (PLAVIX) 75 MG tablet, Take 1 tablet (75 mg total) by mouth daily with breakfast., Disp: 30 tablet, Rfl: 1 .  diphenhydrAMINE (BENADRYL) 25 MG tablet, Take 25 mg by mouth every 4 (four) hours as needed., Disp: , Rfl:  .  fluticasone  (FLOVENT HFA) 110 MCG/ACT inhaler, Inhale 1 puff into the lungs 2 (two) times daily. (Patient not taking: Reported on 11/25/2017), Disp: 1 Inhaler, Rfl: 12 .  mometasone (NASONEX) 50 MCG/ACT nasal spray, Place 2 sprays into the nose daily. (Patient not taking: Reported on 11/25/2017), Disp: 17 g, Rfl: 12 .  Psyllium 28.3 % POWD, Take 500 mg by mouth daily. (Patient not taking: Reported on 10/28/2017), Disp: 283 g, Rfl:   Past Medical History: Past Medical History:  Diagnosis Date  . Anxiety and depression   . ASD (atrial septal defect), common atrium (single atrium)   . Heart murmur   . Heartburn   . Kidney stone     Tobacco Use: Social History   Tobacco Use  Smoking Status Current Every Day Smoker  . Packs/day: 0.25  . Types: Cigarettes  Smokeless Tobacco Current User  Tobacco Comment   down to 4 cigs per day    Labs: Recent Review Flowsheet Data    Labs for ITP Cardiac and Pulmonary Rehab Latest Ref Rng & Units 07/09/2009 11/26/2015 10/26/2017   Cholestrol 0 - 200 mg/dL - 214(H) 213(H)   LDLCALC 0 - 99 mg/dL - 128(H) 130(H)   LDLDIRECT mg/dL 130.4 - -   HDL >40 mg/dL - 63 49   Trlycerides <150 mg/dL - 115 172(H)   Hemoglobin A1c 4.8 - 5.6 % - - 5.4  Exercise Target Goals:    Exercise Program Goal: Individual exercise prescription set using results from initial 6 min walk test and THRR while considering  patient's activity barriers and safety.   Exercise Prescription Goal: Initial exercise prescription builds to 30-45 minutes a day of aerobic activity, 2-3 days per week.  Home exercise guidelines will be given to patient during program as part of exercise prescription that the participant will acknowledge.  Activity Barriers & Risk Stratification:   6 Minute Walk: 6 Minute Walk    Row Name 03/21/18 1623         6 Minute Walk   Distance  1400 feet     Walk Time  6 minutes     # of Rest Breaks  0     METS  3.66     RPE  12     Perceived Dyspnea   1      VO2 Peak  12.82     Symptoms  Yes (comment)     Comments  headache 2/10 - happens when she exerts - went away after walk test     Resting HR  87 bpm     Resting BP  116/62     Resting Oxygen Saturation   100 %     Exercise Oxygen Saturation  during 6 min walk  99 %     Max Ex. HR  100 bpm     Max Ex. BP  132/66     2 Minute Post BP  104/56        Oxygen Initial Assessment:   Oxygen Re-Evaluation:   Oxygen Discharge (Final Oxygen Re-Evaluation):   Initial Exercise Prescription: Initial Exercise Prescription - 03/21/18 1600      Date of Initial Exercise RX and Referring Provider   Date  03/21/18    Referring Provider  [name]      Treadmill   MPH  2.5    Grade  1.5    Minutes  15    METs  3.5      Recumbant Bike   Level  5    RPM  60    Watts  45    Minutes  15    METs  3.5      REL-XR   Level  3    Speed  50    Minutes  15    METs  3.5      Prescription Details   Frequency (times per week)  3    Duration  Progress to 45 minutes of aerobic exercise without signs/symptoms of physical distress      Intensity   THRR 40-80% of Max Heartrate  119-152    Ratings of Perceived Exertion  11-13    Perceived Dyspnea  0-4      Resistance Training   Training Prescription  Yes    Weight  3 lb    Reps  10-15       Perform Capillary Blood Glucose checks as needed.  Exercise Prescription Changes: Exercise Prescription Changes    Row Name 03/30/18 1100 04/07/18 1600 04/13/18 1400 04/27/18 1400       Response to Exercise   Blood Pressure (Admit)  118/68  -  110/70  134/68    Blood Pressure (Exercise)  138/70  -  138/82  152/72    Blood Pressure (Exit)  104/64  -  122/62  128/78    Heart Rate (Admit)  68 bpm  -  80 bpm  99  bpm    Heart Rate (Exercise)  112 bpm  -  124 bpm  128 bpm    Heart Rate (Exit)  85 bpm  -  82 bpm  88 bpm    Rating of Perceived Exertion (Exercise)  12  -  13  13    Symptoms  none  -  none  none    Duration  Progress to 45 minutes of  aerobic exercise without signs/symptoms of physical distress  -  Continue with 45 min of aerobic exercise without signs/symptoms of physical distress.  Continue with 45 min of aerobic exercise without signs/symptoms of physical distress.    Intensity  THRR unchanged  -  THRR unchanged  THRR unchanged      Progression   Progression  Continue to progress workloads to maintain intensity without signs/symptoms of physical distress.  -  Continue to progress workloads to maintain intensity without signs/symptoms of physical distress.  Continue to progress workloads to maintain intensity without signs/symptoms of physical distress.    Average METs  3.33  -  3.68  3.78      Resistance Training   Training Prescription  Yes  -  Yes  Yes    Weight  3 lb  -  3 lb  3 lb    Reps  10-15  -  10-15  10-15      Interval Training   Interval Training  -  -  -  No      Treadmill   MPH  2.5  -  2.5  2.5    Grade  1.5  -  1.5  3    Minutes  15  -  15  15    METs  3.26  -  3.26  3.6      Recumbant Bike   Level  -  -  -  5    RPM  -  -  -  60    Watts  -  -  -  44    Minutes  -  -  -  15    METs  -  -  -  3.6      REL-XR   Level  3  -  4  -    Speed  50  -  50  -    Minutes  15  -  15  -    METs  3.4  -  4.1  -      Home Exercise Plan   Plans to continue exercise at  -  [employer] (comment[facility] [employer]  [employer] [facility] [employer]  [facility]    Frequency  -  Add 1 additional day to program exercise sessions.  Add 1 additional day to program exercise sessions.  Add 1 additional day to program exercise sessions.    Initial Home Exercises Provided  -  04/07/18  04/07/18  04/07/18       Exercise Comments: Exercise Comments    Row Name 03/21/18 1640 04/07/18 1645 04/07/18 1717       Exercise Comments   First full day of exercise!  Patient was oriented to gym and equipment including functions, settings, policies, and procedures.  Patient's  individual exercise prescription and treatment plan were reviewed.  All starting workloads were established based on the results of the 6 minute walk test done at initial orientation visit.  The plan for exercise progression was also introduced and progression  will be customized based on patient's performance and goals.  Reviewed home exercise with pt today.  Pt plans to walk for exercise.  Reviewed THR, pulse, RPE, sign and symptoms, NTG use, and when to call 911 or MD.  Also discussed weather considerations and indoor options.  Pt voiced understanding.  Jeidy has had some dull ache in her chest and R arm since her procedure.  Her Dr has not expressed concern and it does not happen during exercise.  She was advised to call her Dr if it worsens.        Exercise Goals and Review: Exercise Goals    Row Name 03/21/18 1623             Exercise Goals   Increase Physical Activity  Yes       Intervention  Provide advice, education, support and counseling about physical activity/exercise needs.;Develop an individualized exercise prescription for aerobic and resistive training based on initial evaluation findings, risk stratification, comorbidities and participant's personal goals.       Expected Outcomes  Short Term: Attend rehab on a regular basis to increase amount of physical activity.;Long Term: Add in home exercise to make exercise part of routine and to increase amount of physical activity.;Long Term: Exercising regularly at least 3-5 days a week.       Increase Strength and Stamina  Yes       Intervention  Provide advice, education, support and counseling about physical activity/exercise needs.;Develop an individualized exercise prescription for aerobic and resistive training based on initial evaluation findings, risk stratification, comorbidities and participant's personal goals.       Expected Outcomes  Short Term: Increase workloads from initial exercise prescription for resistance, speed, and  METs.;Short Term: Perform resistance training exercises routinely during rehab and add in resistance training at home;Long Term: Improve cardiorespiratory fitness, muscular endurance and strength as measured by increased METs and functional capacity (6MWT)       Able to understand and use rate of perceived exertion (RPE) scale  Yes       Intervention  Provide education and explanation on how to use RPE scale       Expected Outcomes  Short Term: Able to use RPE daily in rehab to express subjective intensity level;Long Term:  Able to use RPE to guide intensity level when exercising independently       Able to understand and use Dyspnea scale  Yes       Intervention  Provide education and explanation on how to use Dyspnea scale       Expected Outcomes  Short Term: Able to use Dyspnea scale daily in rehab to express subjective sense of shortness of breath during exertion;Long Term: Able to use Dyspnea scale to guide intensity level when exercising independently       Knowledge and understanding of Target Heart Rate Range (THRR)  Yes       Intervention  Provide education and explanation of THRR including how the numbers were predicted and where they are located for reference       Expected Outcomes  Short Term: Able to state/look up THRR;Long Term: Able to use THRR to govern intensity when exercising independently;Short Term: Able to use daily as guideline for intensity in rehab       Able to check pulse independently  Yes       Intervention  Provide education and demonstration on how to check pulse in carotid and radial arteries.;Review the importance of being able to  check your own pulse for safety during independent exercise       Expected Outcomes  Short Term: Able to explain why pulse checking is important during independent exercise;Long Term: Able to check pulse independently and accurately       Understanding of Exercise Prescription  Yes       Intervention  Provide education, explanation, and  written materials on patient's individual exercise prescription       Expected Outcomes  Short Term: Able to explain program exercise prescription;Long Term: Able to explain home exercise prescription to exercise independently          Exercise Goals Re-Evaluation : Exercise Goals Re-Evaluation    Row Name 03/21/18 1640 03/30/18 1206 04/07/18 1645 04/13/18 1413 04/27/18 1503     Exercise Goal Re-Evaluation   Exercise Goals Review  Increase Physical Activity;Understanding of Exercise Prescription;Knowledge and understanding of Target Heart Rate Range (THRR);Able to understand and use rate of perceived exertion (RPE) scale  Increase Physical Activity;Increase Strength and Stamina;Able to understand and use rate of perceived exertion (RPE) scale;Knowledge and understanding of Target Heart Rate Range (THRR);Understanding of Exercise Prescription  Increase Physical Activity;Understanding of Exercise Prescription;Increase Strength and Stamina;Knowledge and understanding of Target Heart Rate Range (THRR)  Increase Physical Activity;Increase Strength and Stamina;Able to understand and use rate of perceived exertion (RPE) scale;Knowledge and understanding of Target Heart Rate Range (THRR);Understanding of Exercise Prescription  Increase Physical Activity;Able to understand and use rate of perceived exertion (RPE) scale;Increase Strength and Stamina   Comments  Reviewed RPE scale, THR and program prescription with pt today.  Pt voiced understanding and was given a copy of goals to take home.   Eveleigh is not quite reaching her THR goals.  Staff will monitor as she is just starting back.  She has a positive attitude about working hard.  Reviewed home exercise with pt today.  Pt plans to walk for exercise.  Reviewed THR, pulse, RPE, sign and symptoms, NTG use, and when to call 911 or MD.  Also discussed weather considerations and indoor options.  Pt voiced understanding.  Yannely is improving MET level slowly.  She  has increased resistance on XR - staff wil suggest increase in DB weights and TM.   Othella is making steady progress with exercise.  Staff will encourage interval training.   Expected Outcomes  Short: Use RPE daily to regulate intensity.  Long: Follow program prescription in THR.  Short - Rini will attend class 3 days per week Long - Michiko will complete HT and exercise on her own  Short - Ravin will begin walking and monitoring pulse one day in addition to classes Long - Laresa will exercise on her own  Short - Atalya will continue to attend regularly Long - Neilah will maintain improved fitness on her own  Short - Avalyn will add intervals trianing to her program Long - Dalissa will improve overall MET level      Discharge Exercise Prescription (Final Exercise Prescription Changes): Exercise Prescription Changes - 04/27/18 1400      Response to Exercise   Blood Pressure (Admit)  134/68    Blood Pressure (Exercise)  152/72    Blood Pressure (Exit)  128/78    Heart Rate (Admit)  99 bpm    Heart Rate (Exercise)  128 bpm    Heart Rate (Exit)  88 bpm    Rating of Perceived Exertion (Exercise)  13    Symptoms  none    Duration  Continue with 45  min of aerobic exercise without signs/symptoms of physical distress.    Intensity  THRR unchanged      Progression   Progression  Continue to progress workloads to maintain intensity without signs/symptoms of physical distress.    Average METs  3.78      Resistance Training   Training Prescription  Yes    Weight  3 lb    Reps  10-15      Interval Training   Interval Training  No      Treadmill   MPH  2.5    Grade  3    Minutes  15    METs  3.6      Recumbant Bike   Level  5    RPM  60    Watts  44    Minutes  15    METs  3.6      Home Exercise Plan   Plans to continue exercise at  [employer] (comment) [employer]    Frequency  Add 1 additional day to program exercise sessions.    Initial Home Exercises Provided   04/07/18       Nutrition:  Target Goals: Understanding of nutrition guidelines, daily intake of sodium <1560m, cholesterol <2016m calories 30% from fat and 7% or less from saturated fats, daily to have 5 or more servings of fruits and vegetables.  Biometrics: Pre Biometrics - 03/21/18 1622      Pre Biometrics   Height  5' 1.75" (1.568 m)    Weight  187 lb 11.2 oz (85.1 kg)    Waist Circumference  40 inches    Hip Circumference  49 inches    Waist to Hip Ratio  0.82 %    BMI (Calculated)  34.63        Nutrition Therapy Plan and Nutrition Goals: Nutrition Therapy & Goals - 04/20/18 0911      Nutrition Therapy   Diet  TLC    Drug/Food Interactions  Statins/Certain Fruits    Protein (specify units)  6-7oz    Fiber  25 grams    Whole Grain Foods  3 servings    Saturated Fats  11 max. grams    Fruits and Vegetables  5 servings/day 8 ideal    Sodium  2000 grams      Personal Nutrition Goals   Nutrition Goal  Decrease total calories coming from beverages, for example sweet tea and coffee. Choose low or zero calorie varieties    Personal Goal #2  Increase daily fiber intake to 25g/day to help lower cholesterol and increase satiety. Fruits, vegetables and whole grains all contain fiber    Personal Goal #3  Limit the amount of fried foods you eat. Choose vegetables or a baked potato as a side when eating out, and choose lean non-fried meats instead    Personal Goal #4  Use the nutrition facts label to identify good and bad fats, total fat, and sodium    Comments  She has been increasing her intake of vegetables. Eats out or chooses frozen meals daily for dinner but doest bring her lunch and breakfast to work daily.      Intervention Plan   Intervention  Prescribe, educate and counsel regarding individualized specific dietary modifications aiming towards targeted core components such as weight, hypertension, lipid management, diabetes, heart failure and other comorbidities.;Nutrition  handout(s) given to patient. Guidelines for Losing Weight handout & Goals    Expected Outcomes  Short Term Goal: Understand basic principles  of dietary content, such as calories, fat, sodium, cholesterol and nutrients.;Short Term Goal: A plan has been developed with personal nutrition goals set during dietitian appointment.;Long Term Goal: Adherence to prescribed nutrition plan.       Nutrition Assessments:   Nutrition Goals Re-Evaluation: Nutrition Goals Re-Evaluation    [place] Name 04/07/18 1659 04/20/18 0917 04/20/18 0918 05/02/18 1612       Goals   Current Weight  188 lb 9.6 oz (85.5 kg)  -  -  -    Nutrition Goal  Eat healthier and meet with the dietician  Decrease total calories coming from beverages, for example sweet tea and coffee. Choose low or zero calorie varieties  -  Pt states she is drinking more water, having fish twice per week and avoiding fried foods.  No change in weight yet - will continue exercise and eating medittearanean diet as recommended by RD.      Comment  Libertie wants to lose weight and eat a healthier diet. She has an appointment with the dietician on April 24th  She currently drinks at least 3 cups of coffee/day with creamer. Drinks sweet tea frequently with dinner.  -  -    Expected Outcome  Short: meet with the dietician. Long: Adhere to a diet plan  She will consume less calories through beverages each day  -  Short - Pt will continue to follow RD recommendation Long - Pt will reach goal weight      Personal Goal #2 Re-Evaluation   Personal Goal #2  -  -  Increase daily fiber intake to 25g/ay to help lower cholesterol and increase satiety. Fruits, vegetables, and whole grains all contain fiber  -      Personal Goal #3 Re-Evaluation   Personal Goal #3  -  -  Limit the amount of fried foods you eat. Choose vegetables or a baked potato as a side when eating out, and choose lan non-fried meats instead  -      Personal Goal #4 Re-Evaluation   Personal Goal #4  -   -  Use the nutrition facts label to identify good and bad fats, total fat and sodium  -       Nutrition Goals Discharge (Final Nutrition Goals Re-Evaluation): Nutrition Goals Re-Evaluation - 05/02/18 1612      Goals   Nutrition Goal  Pt states she is drinking more water, having fish twice per week and avoiding fried foods.  No change in weight yet - will continue exercise and eating medittearanean diet as recommended by RD.      Expected Outcome  Short - Pt will continue to follow RD recommendation Long - Pt will reach goal weight       Psychosocial: Target Goals: Acknowledge presence or absence of significant depression and/or stress, maximize coping skills, provide positive support system. Participant is able to verbalize types and ability to use techniques and skills needed for reducing stress and depression.   Initial Review & Psychosocial Screening:   Quality of Life Scores:  Quality of Life - 03/21/18 1844      Quality of Life Scores   Health/Function Pre  22.5 %    Socioeconomic Pre  24.29 %    Psych/Spiritual Pre  19.5 %    Family Pre  22.1 %    GLOBAL Pre  22.19 %      Scores of 19 and below usually indicate a poorer quality of life in these areas.  A difference of  2-3 points is a clinically meaningful difference.  A difference of 2-3 points in the total score of the Quality of Life Index has been associated with significant improvement in overall quality of life, self-image, physical symptoms, and general health in studies assessing change in quality of life.  PHQ-9: Recent Review Flowsheet Data    Depression screen [facility] 2/9 03/21/2018 11/25/2017 01/20/2016 12/12/2015 10/14/2015   Decreased Interest 1 2 0 0 0   Down, Depressed, Hopeless 1 3 0 0 0   PHQ - 2 Score 2 5 0 0 0   Altered sleeping 0 1 - - -   Tired, decreased energy 3 3 - - -   Change in appetite 0 3 - - -   Feeling bad or failure about yourself  0 0 - - -   Trouble concentrating 1 1 - - -   Moving slowly  or fidgety/restless 0 0 - - -   Suicidal thoughts 0 0 - - -   PHQ-9 Score 6 13 - - -   Difficult doing work/chores Somewhat difficult Somewhat difficult - - -     Interpretation of Total Score  Total Score Depression Severity:  1-4 = Minimal depression, 5-9 = Mild depression, 10-14 = Moderate depression, 15-19 = Moderately severe depression, 20-27 = Severe depression   Psychosocial Evaluation and Intervention: Psychosocial Evaluation - 03/28/18 1649      Psychosocial Evaluation & Interventions   Interventions  Encouraged to exercise with the program and follow exercise prescription;Stress management education;Relaxation education    Comments  Counselor met with Ms. Moline Anne Ng) today for initial psychosocial evaluation.  She is a 52 year old who had a STEMI on 10/30 and subsequent stressors with the death of her father in December 06, 2023 preventing her from attending this program consistently earlier.  She has a strong support system with a significant other of 6 years; and an adult son and daughter who live locally.  Zamani reports sleeping well and has a good appetite.  She admits to a history of of depression since she was a teenager and PTSD diagnosed ~3 years ago.  Her mood symptoms are managed by medication and she attends a PTSD support group weekly.  Ronin has had multiple stressors in the past 6 months with her own health issues; the death of her father; single parently and working 7 days/week.  Counselor spoke to her about making positive healthy changes; which are her goals and she is planning to discontinue the part-time job on the weekends in the near future.  Bowen has goals to increase her energy and improve her mood as well as learn ways to be healthier in general.  She will be followed by staff throughout the course of this program.      Expected Outcomes  Haille will benefit from consistent exercise to achieve her stated goals.  Long - Exercise to increase energy and improve  mood/manage stress.  Short - set healthier limits with her work schedule to allow some "down time" and rest/refresh.    Continue Psychosocial Services   Follow up required by staff       Psychosocial Re-Evaluation: Psychosocial Re-Evaluation    [place] Name 04/07/18 1710             Psychosocial Re-Evaluation   Comments  No new stress - more settled since move is done       Expected Outcomes  Short - continue to attend class Long - manage stress on her own  Interventions  Encouraged to attend Cardiac Rehabilitation for the exercise       Continue Psychosocial Services   Follow up required by staff          Psychosocial Discharge (Final Psychosocial Re-Evaluation): Psychosocial Re-Evaluation - 04/07/18 1710      Psychosocial Re-Evaluation   Comments  No new stress - more settled since move is done    Expected Outcomes  Short - continue to attend class Long - manage stress on her own    Interventions  Encouraged to attend Cardiac Rehabilitation for the exercise    Continue Psychosocial Services   Follow up required by staff       Vocational Rehabilitation: Provide vocational rehab assistance to qualifying candidates.   Vocational Rehab Evaluation & Intervention: Vocational Rehab - 03/21/18 1844      Initial Vocational Rehab Evaluation & Intervention   Assessment shows need for Vocational Rehabilitation  No       Education: Education Goals: Education classes will be provided on a variety of topics geared toward better understanding of heart health and risk factor modification. Participant will state understanding/return demonstration of topics presented as noted by education test scores.  Learning Barriers/Preferences:   Education Topics:  AED/CPR: - Group verbal and written instruction with the use of models to demonstrate the basic use of the AED with the basic ABC's of resuscitation.   Cardiac Rehab from 04/27/2018 in [facility] Cardiac and Pulmonary Rehab  Date   03/30/18  Educator  CE  Instruction Review Code  1- Verbalizes Understanding      General Nutrition Guidelines/Fats and Fiber: -Group instruction provided by verbal, written material, models and posters to present the general guidelines for heart healthy nutrition. Gives an explanation and review of dietary fats and fiber.   Cardiac Rehab from 04/27/2018 in [facility] Cardiac and Pulmonary Rehab  Date  03/21/18  Educator  PI  Instruction Review Code  1- Verbalizes Understanding      Controlling Sodium/Reading Food Labels: -Group verbal and written material supporting the discussion of sodium use in heart healthy nutrition. Review and explanation with models, verbal and written materials for utilization of the food label.   Cardiac Rehab from 04/27/2018 in [facility] Cardiac and Pulmonary Rehab  Date  03/28/18  Educator  PI  Instruction Review Code  1- Verbalizes Understanding      Exercise Physiology & General Exercise Guidelines: - Group verbal and written instruction with models to review the exercise physiology of the cardiovascular system and associated critical values. Provides general exercise guidelines with specific guidelines to those with heart or lung disease.    Cardiac Rehab from 04/27/2018 in [facility] Cardiac and Pulmonary Rehab  Date  04/06/18  Educator  AS  Instruction Review Code  1- Verbalizes Understanding      Aerobic Exercise & Resistance Training: - Gives group verbal and written instruction on the various components of exercise. Focuses on aerobic and resistive training programs and the benefits of this training and how to safely progress through these programs..   Cardiac Rehab from 04/27/2018 in [facility] Cardiac and Pulmonary Rehab  Date  04/11/18  Educator  [facility]  Instruction Review Code  1- Geologist, engineering, Balance, Mind/Body Relaxation: Provides group verbal/written instruction on the benefits of flexibility and balance training, including mind/body  exercise modes such as yoga, pilates and tai chi.  Demonstration and skill practice provided.   Stress and Anxiety: - Provides group verbal and written instruction about the  health risks of elevated stress and causes of high stress.  Discuss the correlation between heart/lung disease and anxiety and treatment options. Review healthy ways to manage with stress and anxiety.   Cardiac Rehab from 04/27/2018 in [facility] Cardiac and Pulmonary Rehab  Date  04/27/18  Educator  [facility]  Instruction Review Code  1- Verbalizes Understanding      Depression: - Provides group verbal and written instruction on the correlation between heart/lung disease and depressed mood, treatment options, and the stigmas associated with seeking treatment.   Anatomy & Physiology of the Heart: - Group verbal and written instruction and models provide basic cardiac anatomy and physiology, with the coronary electrical and arterial systems. Review of Valvular disease and Heart Failure   Cardiac Procedures: - Group verbal and written instruction to review commonly prescribed medications for heart disease. Reviews the medication, class of the drug, and side effects. Includes the steps to properly store meds and maintain the prescription regimen. (beta blockers and nitrates)   Cardiac Medications I: - Group verbal and written instruction to review commonly prescribed medications for heart disease. Reviews the medication, class of the drug, and side effects. Includes the steps to properly store meds and maintain the prescription regimen.   Cardiac Rehab from 05/02/2018 in [facility] Cardiac and Pulmonary Rehab  Date  05/02/18  Educator  CE  Instruction Review Code  1- Verbalizes Understanding      Cardiac Medications II: -Group verbal and written instruction to review commonly prescribed medications for heart disease. Reviews the medication, class of the drug, and side effects. (all other drug classes)   Cardiac Rehab from 04/27/2018 in  [facility] Cardiac and Pulmonary Rehab  Date  04/20/18  Educator  [facility]  Instruction Review Code  1- Verbalizes Understanding       Go Sex-Intimacy & Heart Disease, Get SMART - Goal Setting: - Group verbal and written instruction through game format to discuss heart disease and the return to sexual intimacy. Provides group verbal and written material to discuss and apply goal setting through the application of the S.M.A.R.T. Method.   Other Matters of the Heart: - Provides group verbal, written materials and models to describe Stable Angina and Peripheral Artery. Includes description of the disease process and treatment options available to the cardiac patient.   Exercise & Equipment Safety: - Individual verbal instruction and demonstration of equipment use and safety with use of the equipment.   Cardiac Rehab from 04/27/2018 in [facility] Cardiac and Pulmonary Rehab  Date  11/25/17  Educator  mc  Instruction Review Code  1- Verbalizes Understanding      Infection Prevention: - Provides verbal and written material to individual with discussion of infection control including proper hand washing and proper equipment cleaning during exercise session.   Cardiac Rehab from 04/27/2018 in [facility] Cardiac and Pulmonary Rehab  Date  11/25/17  Educator  mc  Instruction Review Code  1- Verbalizes Understanding      Falls Prevention: - Provides verbal and written material to individual with discussion of falls prevention and safety.   Cardiac Rehab from 04/27/2018 in [facility] Cardiac and Pulmonary Rehab  Date  11/25/17  Educator  mc  Instruction Review Code  1- Verbalizes Understanding      Diabetes: - Individual verbal and written instruction to review signs/symptoms of diabetes, desired ranges of glucose level fasting, after meals and with exercise. Acknowledge that pre and post exercise glucose checks will be done for 3 sessions at entry of program.  Know Your Numbers and Risk Factors: -Group verbal and  written instruction about important numbers in your health.  Discussion of what are risk factors and how they play a role in the disease process.  Review of Cholesterol, Blood Pressure, Diabetes, and BMI and the role they play in your overall health.   Cardiac Rehab from 04/27/2018 in [facility] Cardiac and Pulmonary Rehab  Date  04/20/18  Educator  [facility]  Instruction Review Code  1- Verbalizes Understanding      Sleep Hygiene: -Provides group verbal and written instruction about how sleep can affect your health.  Define sleep hygiene, discuss sleep cycles and impact of sleep habits. Review good sleep hygiene tips.    Other: -Provides group and verbal instruction on various topics (see comments)   Knowledge Questionnaire Score:   Core Components/Risk Factors/Patient Goals at Admission:   Core Components/Risk Factors/Patient Goals Review:  Goals and Risk Factor Review    Row Name 04/07/18 1708 05/02/18 1616           Core Components/Risk Factors/Patient Goals Review   Personal Goals Review  Weight Management/Obesity;Stress;Tobacco Cessation;Hypertension;Lipids  Weight Management/Obesity;Lipids;Hypertension;Stress;Tobacco Cessation      Review  Xolani is down to 3-4 cigarettes a day.  Her stress level is better since she is settled after moving.  She is watching sodium and eating more fruits and vegetables.  She has appt with RD in 2 weeks.  Pt reports taking all meds as directed.  BP has been normal during HT classes.  She met with RD last week for dietary changes.  She is meeting with Juliann Pulse today about stress.      Expected Outcomes  Short - Manika will continue to attend and modify diet to be more healthy [place] will exercise on her own and maintain healthy lifestyle changes  Short - Pt will continue to exercise to help manage stress and keep heart healthy .  She is still smiking 3 cigarettes per day  Long - Pt will quit smoking and will reach weight goals, maintain exercise on her  own         Core Components/Risk Factors/Patient Goals at Discharge (Final Review):  Goals and Risk Factor Review - 05/02/18 1616      Core Components/Risk Factors/Patient Goals Review   Personal Goals Review  Weight Management/Obesity;Lipids;Hypertension;Stress;Tobacco Cessation    Review  Pt reports taking all meds as directed.  BP has been normal during HT classes.  She met with RD last week for dietary changes.  She is meeting with Juliann Pulse today about stress.    Expected Outcomes  Short - Pt will continue to exercise to help manage stress and keep heart healthy .  She is still smiking 3 cigarettes per day  Long - Pt will quit smoking and will reach weight goals, maintain exercise on her own       ITP Comments: ITP Comments    Row Name 03/23/18 0646 04/20/18 0625 04/25/18 1708 05/18/18 0628 06/15/18 0627   ITP Comments  30 Day review. Continue with ITP unless directed changes per Medical Director review.   Returns as new start after lengthy absence  30 day review. Continue with ITP unless directed changes per Medical Director  Salote reports a spider bite that is being treated with antibiotics  30 day review. Continue with ITP unless directed changes per Medical Director  Last visit 05/02/2018  30 day review. Continue with ITP unless directed changes per Medical Director review   Out since 5/6  Comments:

## 2018-06-16 NOTE — Progress Notes (Signed)
Cardiac Individual Treatment Plan  Patient Details  Name: Angela Mccullough MRN: [id number] Date of Birth: [birth year] Referring Provider:     Cardiac Rehab from 03/21/2018 in [facility] Cardiac and Pulmonary Rehab  Referring Provider  [name]      Initial Encounter Date:    Cardiac Rehab from 03/21/2018 in [facility] Cardiac and Pulmonary Rehab  Date  03/21/18  Referring Provider  [name]      Visit Diagnosis: No diagnosis found.  Patient's Home Medications on Admission:  Current Outpatient Medications:  .  albuterol (PROVENTIL HFA;VENTOLIN HFA) 108 (90 BASE) MCG/ACT inhaler, Inhale 2 puffs into the lungs every 6 (six) hours as needed for wheezing or shortness of breath. (Patient not taking: Reported on 11/25/2017), Disp: 1 Inhaler, Rfl: 12 .  aspirin 81 MG chewable tablet, Chew 1 tablet (81 mg total) by mouth daily., Disp: 30 tablet, Rfl: 1 .  atorvastatin (LIPITOR) 80 MG tablet, Take 1 tablet (80 mg total) by mouth daily at 6 PM., Disp: 30 tablet, Rfl: 1 .  baclofen (LIORESAL) 10 MG tablet, Take 0.5-1 tablets (5-10 mg total) by mouth 3 (three) times daily as needed for muscle spasms., Disp: 30 each, Rfl: 1 .  carvedilol (COREG) 3.125 MG tablet, Take 1 tablet (3.125 mg total) by mouth 2 (two) times daily with a meal., Disp: 60 tablet, Rfl: 1 .  cetirizine (ZYRTEC) 10 MG tablet, Take 10 mg by mouth daily., Disp: , Rfl:  .  Cholecalciferol 2000 units CAPS, Take 1 capsule (2,000 Units total) by mouth daily. (Patient not taking: Reported on 11/25/2017), Disp: 30 each, Rfl:  .  citalopram (CELEXA) 20 MG tablet, Take 20 mg by mouth daily., Disp: , Rfl:  .  citalopram (CELEXA) 40 MG tablet, , Disp: , Rfl:  .  clopidogrel (PLAVIX) 75 MG tablet, Take 1 tablet (75 mg total) by mouth daily with breakfast., Disp: 30 tablet, Rfl: 1 .  diphenhydrAMINE (BENADRYL) 25 MG tablet, Take 25 mg by mouth every 4 (four) hours as needed., Disp: , Rfl:  .  fluticasone (FLOVENT HFA) 110 MCG/ACT inhaler, Inhale 1 puff  into the lungs 2 (two) times daily. (Patient not taking: Reported on 11/25/2017), Disp: 1 Inhaler, Rfl: 12 .  mometasone (NASONEX) 50 MCG/ACT nasal spray, Place 2 sprays into the nose daily. (Patient not taking: Reported on 11/25/2017), Disp: 17 g, Rfl: 12 .  Psyllium 28.3 % POWD, Take 500 mg by mouth daily. (Patient not taking: Reported on 10/28/2017), Disp: 283 g, Rfl:   Past Medical History: Past Medical History:  Diagnosis Date  . Anxiety and depression   . ASD (atrial septal defect), common atrium (single atrium)   . Heart murmur   . Heartburn   . Kidney stone     Tobacco Use: Social History   Tobacco Use  Smoking Status Current Every Day Smoker  . Packs/day: 0.25  . Types: Cigarettes  Smokeless Tobacco Current User  Tobacco Comment   down to 4 cigs per day    Labs: Recent Review Flowsheet Data    Labs for ITP Cardiac and Pulmonary Rehab Latest Ref Rng & Units 07/09/2009 11/26/2015 10/26/2017   Cholestrol 0 - 200 mg/dL - 214(H) 213(H)   LDLCALC 0 - 99 mg/dL - 128(H) 130(H)   LDLDIRECT mg/dL 130.4 - -   HDL >40 mg/dL - 63 49   Trlycerides <150 mg/dL - 115 172(H)   Hemoglobin A1c 4.8 - 5.6 % - - 5.4       Exercise Target Goals:  Exercise Program Goal: Individual exercise prescription set using results from initial 6 min walk test and THRR while considering  patient's activity barriers and safety.   Exercise Prescription Goal: Initial exercise prescription builds to 30-45 minutes a day of aerobic activity, 2-3 days per week.  Home exercise guidelines will be given to patient during program as part of exercise prescription that the participant will acknowledge.  Activity Barriers & Risk Stratification:   6 Minute Walk: 6 Minute Walk    Row Name 03/21/18 1623         6 Minute Walk   Distance  1400 feet     Walk Time  6 minutes     # of Rest Breaks  0     METS  3.66     RPE  12     Perceived Dyspnea   1     VO2 Peak  12.82     Symptoms  Yes (comment)      Comments  headache 2/10 - happens when she exerts - went away after walk test     Resting HR  87 bpm     Resting BP  116/62     Resting Oxygen Saturation   100 %     Exercise Oxygen Saturation  during 6 min walk  99 %     Max Ex. HR  100 bpm     Max Ex. BP  132/66     2 Minute Post BP  104/56        Oxygen Initial Assessment:   Oxygen Re-Evaluation:   Oxygen Discharge (Final Oxygen Re-Evaluation):   Initial Exercise Prescription: Initial Exercise Prescription - 03/21/18 1600      Date of Initial Exercise RX and Referring Provider   Date  03/21/18    Referring Provider  [name]      Treadmill   MPH  2.5    Grade  1.5    Minutes  15    METs  3.5      Recumbant Bike   Level  5    RPM  60    Watts  45    Minutes  15    METs  3.5      REL-XR   Level  3    Speed  50    Minutes  15    METs  3.5      Prescription Details   Frequency (times per week)  3    Duration  Progress to 45 minutes of aerobic exercise without signs/symptoms of physical distress      Intensity   THRR 40-80% of Max Heartrate  119-152    Ratings of Perceived Exertion  11-13    Perceived Dyspnea  0-4      Resistance Training   Training Prescription  Yes    Weight  3 lb    Reps  10-15       Perform Capillary Blood Glucose checks as needed.  Exercise Prescription Changes: Exercise Prescription Changes    Row Name 03/30/18 1100 04/07/18 1600 04/13/18 1400 04/27/18 1400       Response to Exercise   Blood Pressure (Admit)  118/68  -  110/70  134/68    Blood Pressure (Exercise)  138/70  -  138/82  152/72    Blood Pressure (Exit)  104/64  -  122/62  128/78    Heart Rate (Admit)  68 bpm  -  80 bpm  99 bpm    Heart Rate (  Exercise)  112 bpm  -  124 bpm  128 bpm    Heart Rate (Exit)  85 bpm  -  82 bpm  88 bpm    Rating of Perceived Exertion (Exercise)  12  -  13  13    Symptoms  none  -  none  none    Duration  Progress to 45 minutes of aerobic exercise without signs/symptoms of  physical distress  -  Continue with 45 min of aerobic exercise without signs/symptoms of physical distress.  Continue with 45 min of aerobic exercise without signs/symptoms of physical distress.    Intensity  THRR unchanged  -  THRR unchanged  THRR unchanged      Progression   Progression  Continue to progress workloads to maintain intensity without signs/symptoms of physical distress.  -  Continue to progress workloads to maintain intensity without signs/symptoms of physical distress.  Continue to progress workloads to maintain intensity without signs/symptoms of physical distress.    Average METs  3.33  -  3.68  3.78      Resistance Training   Training Prescription  Yes  -  Yes  Yes    Weight  3 lb  -  3 lb  3 lb    Reps  10-15  -  10-15  10-15      Interval Training   Interval Training  -  -  -  No      Treadmill   MPH  2.5  -  2.5  2.5    Grade  1.5  -  1.5  3    Minutes  15  -  15  15    METs  3.26  -  3.26  3.6      Recumbant Bike   Level  -  -  -  5    RPM  -  -  -  60    Watts  -  -  -  44    Minutes  -  -  -  15    METs  -  -  -  3.6      REL-XR   Level  3  -  4  -    Speed  50  -  50  -    Minutes  15  -  15  -    METs  3.4  -  4.1  -      Home Exercise Plan   Plans to continue exercise at  -  [employer] ([facility] [employer]  [employer] [facility] [employer]  [facility]    Frequency  -  Add 1 additional day to program exercise sessions.  Add 1 additional day to program exercise sessions.  Add 1 additional day to program exercise sessions.    Initial Home Exercises Provided  -  04/07/18  04/07/18  04/07/18       Exercise Comments: Exercise Comments    Row Name 03/21/18 1640 04/07/18 1645 04/07/18 1717       Exercise Comments   First full day of exercise!  Patient was oriented to gym and equipment including functions, settings, policies, and procedures.  Patient's individual exercise prescription and  treatment plan were reviewed.  All starting workloads were established based on the results of the 6 minute walk test done at initial orientation visit.  The plan for exercise progression was also introduced and progression will be customized based on patient's  performance and goals.  Reviewed home exercise with pt today.  Pt plans to walk for exercise.  Reviewed THR, Mccullough, RPE, sign and symptoms, NTG use, and when to call 911 or MD.  Also discussed weather considerations and indoor options.  Pt voiced understanding.  Angela Mccullough has had some dull ache in her chest and R arm since her procedure.  Her Dr has not expressed concern and it does not happen during exercise.  She was advised to call her Dr if it worsens.        Exercise Goals and Review: Exercise Goals    Row Name 03/21/18 1623             Exercise Goals   Increase Physical Activity  Yes       Intervention  Provide advice, education, support and counseling about physical activity/exercise needs.;Develop an individualized exercise prescription for aerobic and resistive training based on initial evaluation findings, risk stratification, comorbidities and participant's personal goals.       Expected Outcomes  Short Term: Attend rehab on a regular basis to increase amount of physical activity.;Long Term: Add in home exercise to make exercise part of routine and to increase amount of physical activity.;Long Term: Exercising regularly at least 3-5 days a week.       Increase Strength and Stamina  Yes       Intervention  Provide advice, education, support and counseling about physical activity/exercise needs.;Develop an individualized exercise prescription for aerobic and resistive training based on initial evaluation findings, risk stratification, comorbidities and participant's personal goals.       Expected Outcomes  Short Term: Increase workloads from initial exercise prescription for resistance, speed, and METs.;Short Term: Perform resistance  training exercises routinely during rehab and add in resistance training at home;Long Term: Improve cardiorespiratory fitness, muscular endurance and strength as measured by increased METs and functional capacity (6MWT)       Able to understand and use rate of perceived exertion (RPE) scale  Yes       Intervention  Provide education and explanation on how to use RPE scale       Expected Outcomes  Short Term: Able to use RPE daily in rehab to express subjective intensity level;Long Term:  Able to use RPE to guide intensity level when exercising independently       Able to understand and use Dyspnea scale  Yes       Intervention  Provide education and explanation on how to use Dyspnea scale       Expected Outcomes  Short Term: Able to use Dyspnea scale daily in rehab to express subjective sense of shortness of breath during exertion;Long Term: Able to use Dyspnea scale to guide intensity level when exercising independently       Knowledge and understanding of Target Heart Rate Range (THRR)  Yes       Intervention  Provide education and explanation of THRR including how the numbers were predicted and where they are located for reference       Expected Outcomes  Short Term: Able to state/look up THRR;Long Term: Able to use THRR to govern intensity when exercising independently;Short Term: Able to use daily as guideline for intensity in rehab       Able to check Mccullough independently  Yes       Intervention  Provide education and demonstration on how to check Mccullough in carotid and radial arteries.;Review the importance of being able to check your own Mccullough for safety  during independent exercise       Expected Outcomes  Short Term: Able to explain why Mccullough checking is important during independent exercise;Long Term: Able to check Mccullough independently and accurately       Understanding of Exercise Prescription  Yes       Intervention  Provide education, explanation, and written materials on patient's individual  exercise prescription       Expected Outcomes  Short Term: Able to explain program exercise prescription;Long Term: Able to explain home exercise prescription to exercise independently          Exercise Goals Re-Evaluation : Exercise Goals Re-Evaluation    Row Name 03/21/18 1640 03/30/18 1206 04/07/18 1645 04/13/18 1413 04/27/18 1503     Exercise Goal Re-Evaluation   Exercise Goals Review  Increase Physical Activity;Understanding of Exercise Prescription;Knowledge and understanding of Target Heart Rate Range (THRR);Able to understand and use rate of perceived exertion (RPE) scale  Increase Physical Activity;Increase Strength and Stamina;Able to understand and use rate of perceived exertion (RPE) scale;Knowledge and understanding of Target Heart Rate Range (THRR);Understanding of Exercise Prescription  Increase Physical Activity;Understanding of Exercise Prescription;Increase Strength and Stamina;Knowledge and understanding of Target Heart Rate Range (THRR)  Increase Physical Activity;Increase Strength and Stamina;Able to understand and use rate of perceived exertion (RPE) scale;Knowledge and understanding of Target Heart Rate Range (THRR);Understanding of Exercise Prescription  Increase Physical Activity;Able to understand and use rate of perceived exertion (RPE) scale;Increase Strength and Stamina   Comments  Reviewed RPE scale, THR and program prescription with pt today.  Pt voiced understanding and was given a copy of goals to take home.   Angela Mccullough is not quite reaching her THR goals.  Staff will monitor as she is just starting back.  She has a positive attitude about working hard.  Reviewed home exercise with pt today.  Pt plans to walk for exercise.  Reviewed THR, Mccullough, RPE, sign and symptoms, NTG use, and when to call 911 or MD.  Also discussed weather considerations and indoor options.  Pt voiced understanding.  Angela Mccullough is improving MET level slowly.  She has increased resistance on XR - staff wil  suggest increase in DB weights and TM.   Angela Mccullough is making steady progress with exercise.  Staff will encourage interval training.   Expected Outcomes  Short: Use RPE daily to regulate intensity.  Long: Follow program prescription in THR.  Short - Angela Mccullough will attend class 3 days per week Long - Angela Mccullough will complete HT and exercise on her own  Short - Angela Mccullough will begin walking and monitoring Mccullough one day in addition to classes Long - Angela Mccullough will exercise on her own  Short - Angela Mccullough will continue to attend regularly Long - Angela Mccullough will maintain improved fitness on her own  Short - Angela Mccullough will add intervals trianing to her program Long - Angela Mccullough will improve overall MET level      Discharge Exercise Prescription (Final Exercise Prescription Changes): Exercise Prescription Changes - 04/27/18 1400      Response to Exercise   Blood Pressure (Admit)  134/68    Blood Pressure (Exercise)  152/72    Blood Pressure (Exit)  128/78    Heart Rate (Admit)  99 bpm    Heart Rate (Exercise)  128 bpm    Heart Rate (Exit)  88 bpm    Rating of Perceived Exertion (Exercise)  13    Symptoms  none    Duration  Continue with 45 min of aerobic exercise without signs/symptoms  of physical distress.    Intensity  THRR unchanged      Progression   Progression  Continue to progress workloads to maintain intensity without signs/symptoms of physical distress.    Average METs  3.78      Resistance Training   Training Prescription  Yes    Weight  3 lb    Reps  10-15      Interval Training   Interval Training  No      Treadmill   MPH  2.5    Grade  3    Minutes  15    METs  3.6      Recumbant Bike   Level  5    RPM  60    Watts  44    Minutes  15    METs  3.6      Home Exercise Plan   Plans to continue exercise at  [employer] (comment) [employer]    Frequency  Add 1 additional day to program exercise sessions.    Initial Home Exercises Provided  04/07/18       Nutrition:  Target  Goals: Understanding of nutrition guidelines, daily intake of sodium <1545m, cholesterol <2077m calories 30% from fat and 7% or less from saturated fats, daily to have 5 or more servings of fruits and vegetables.  Biometrics: Pre Biometrics - 03/21/18 1622      Pre Biometrics   Height  5' 1.75" (1.568 m)    Weight  187 lb 11.2 oz (85.1 kg)    Waist Circumference  40 inches    Hip Circumference  49 inches    Waist to Hip Ratio  0.82 %    BMI (Calculated)  34.63        Nutrition Therapy Plan and Nutrition Goals: Nutrition Therapy & Goals - 04/20/18 0911      Nutrition Therapy   Diet  TLC    Drug/Food Interactions  Statins/Certain Fruits    Protein (specify units)  6-7oz    Fiber  25 grams    Whole Grain Foods  3 servings    Saturated Fats  11 max. grams    Fruits and Vegetables  5 servings/day 8 ideal    Sodium  2000 grams      Personal Nutrition Goals   Nutrition Goal  Decrease total calories coming from beverages, for example sweet tea and coffee. Choose low or zero calorie varieties    Personal Goal #2  Increase daily fiber intake to 25g/day to help lower cholesterol and increase satiety. Fruits, vegetables and whole grains all contain fiber    Personal Goal #3  Limit the amount of fried foods you eat. Choose vegetables or a baked potato as a side when eating out, and choose lean non-fried meats instead    Personal Goal #4  Use the nutrition facts label to identify good and bad fats, total fat, and sodium    Comments  She has been increasing her intake of vegetables. Eats out or chooses frozen meals daily for dinner but doest bring her lunch and breakfast to work daily.      Intervention Plan   Intervention  Prescribe, educate and counsel regarding individualized specific dietary modifications aiming towards targeted core components such as weight, hypertension, lipid management, diabetes, heart failure and other comorbidities.;Nutrition handout(s) given to patient. Guidelines  for Losing Weight handout & Goals    Expected Outcomes  Short Term Goal: Understand basic principles of dietary content, such as calories,  fat, sodium, cholesterol and nutrients.;Short Term Goal: A plan has been developed with personal nutrition goals set during dietitian appointment.;Long Term Goal: Adherence to prescribed nutrition plan.       Nutrition Assessments:   Nutrition Goals Re-Evaluation: Nutrition Goals Re-Evaluation    Angela Mccullough Name 04/07/18 1659 04/20/18 0917 04/20/18 0918 05/02/18 1612       Goals   Current Weight  188 lb 9.6 oz (85.5 kg)  -  -  -    Nutrition Goal  Eat healthier and meet with the dietician  Decrease total calories coming from beverages, for example sweet tea and coffee. Choose low or zero calorie varieties  -  Pt states she is drinking more water, having fish twice per week and avoiding fried foods.  No change in weight yet - will continue exercise and eating medittearanean diet as recommended by RD.      Comment  Angela Mccullough wants to lose weight and eat a healthier diet. She has an appointment with the dietician on April 24th  She currently drinks at least 3 cups of coffee/day with creamer. Drinks sweet tea frequently with dinner.  -  -    Expected Outcome  Short: meet with the dietician. Long: Adhere to a diet plan  She will consume less calories through beverages each day  -  Short - Pt will continue to follow RD recommendation Long - Pt will reach goal weight      Personal Goal #2 Re-Evaluation   Personal Goal #2  -  -  Increase daily fiber intake to 25g/ay to help lower cholesterol and increase satiety. Fruits, vegetables, and whole grains all contain fiber  -      Personal Goal #3 Re-Evaluation   Personal Goal #3  -  -  Limit the amount of fried foods you eat. Choose vegetables or a baked potato as a side when eating out, and choose lan non-fried meats instead  -      Personal Goal #4 Re-Evaluation   Personal Goal #4  -  -  Use the nutrition facts label to  identify good and bad fats, total fat and sodium  -       Nutrition Goals Discharge (Final Nutrition Goals Re-Evaluation): Nutrition Goals Re-Evaluation - 05/02/18 1612      Goals   Nutrition Goal  Pt states she is drinking more water, having fish twice per week and avoiding fried foods.  No change in weight yet - will continue exercise and eating medittearanean diet as recommended by RD.      Expected Outcome  Short - Pt will continue to follow RD recommendation Long - Pt will reach goal weight       Psychosocial: Target Goals: Acknowledge presence or absence of significant depression and/or stress, maximize coping skills, provide positive support system. Participant is able to verbalize types and ability to use techniques and skills needed for reducing stress and depression.   Initial Review & Psychosocial Screening:   Quality of Life Scores:  Quality of Life - 03/21/18 1844      Quality of Life Scores   Health/Function Pre  22.5 %    Socioeconomic Pre  24.29 %    Psych/Spiritual Pre  19.5 %    Family Pre  22.1 %    GLOBAL Pre  22.19 %      Scores of 19 and below usually indicate a poorer quality of life in these areas.  A difference of  2-3 points is a clinically  meaningful difference.  A difference of 2-3 points in the total score of the Quality of Life Index has been associated with significant improvement in overall quality of life, self-image, physical symptoms, and general health in studies assessing change in quality of life.  PHQ-9: Recent Review Flowsheet Data    Depression screen [facility] 2/9 03/21/2018 11/25/2017 01/20/2016 12/12/2015 10/14/2015   Decreased Interest 1 2 0 0 0   Down, Depressed, Hopeless 1 3 0 0 0   PHQ - 2 Score 2 5 0 0 0   Altered sleeping 0 1 - - -   Tired, decreased energy 3 3 - - -   Change in appetite 0 3 - - -   Feeling bad or failure about yourself  0 0 - - -   Trouble concentrating 1 1 - - -   Moving slowly or fidgety/restless 0 0 - - -    Suicidal thoughts 0 0 - - -   PHQ-9 Score 6 13 - - -   Difficult doing work/chores Somewhat difficult Somewhat difficult - - -     Interpretation of Total Score  Total Score Depression Severity:  1-4 = Minimal depression, 5-9 = Mild depression, 10-14 = Moderate depression, 15-19 = Moderately severe depression, 20-27 = Severe depression   Psychosocial Evaluation and Intervention: Psychosocial Evaluation - 03/28/18 1649      Psychosocial Evaluation & Interventions   Interventions  Encouraged to exercise with the program and follow exercise prescription;Stress management education;Relaxation education    Comments  Counselor met with Ms. Fouty Anne Ng) today for initial psychosocial evaluation.  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  She has a strong support system with a significant other of 6 years; and an adult son and daughter who live locally.  Angela Mccullough reports sleeping well and has a good appetite.  She admits to a history of of depression since she was a teenager and PTSD diagnosed ~3 years ago.  Her mood symptoms are managed by medication and she attends a PTSD support group weekly.  Angela Mccullough has had multiple stressors in the past 6 months with her own health issues; the death of her father; single parently and working 7 days/week.  Counselor spoke to her about making positive healthy changes; which are her goals and she is planning to discontinue the part-time job on the weekends in the near future.  Angela Mccullough has goals to increase her energy and improve her mood as well as learn ways to be healthier in general.  She will be followed by staff throughout the course of this program.      Expected Outcomes  Angela Mccullough will benefit from consistent exercise to achieve her stated goals.  Long - Exercise to increase energy and improve mood/manage stress.  Short - set  healthier limits with her work schedule to allow some "down time" and rest/refresh.    Continue Psychosocial Services   Follow up required by staff       Psychosocial Re-Evaluation: Psychosocial Re-Evaluation    Angela Mccullough Name 04/07/18 1710             Psychosocial Re-Evaluation   Comments  No new stress - more settled since move is done       Expected Outcomes  Short - continue to attend class Long - manage stress on her own  Interventions  Encouraged to attend Cardiac Rehabilitation for the exercise       Continue Psychosocial Services   Follow up required by staff          Psychosocial Discharge (Final Psychosocial Re-Evaluation): Psychosocial Re-Evaluation - 04/07/18 1710      Psychosocial Re-Evaluation   Comments  No new stress - more settled since move is done    Expected Outcomes  Short - continue to attend class Long - manage stress on her own    Interventions  Encouraged to attend Cardiac Rehabilitation for the exercise    Continue Psychosocial Services   Follow up required by staff       Vocational Rehabilitation: Provide vocational rehab assistance to qualifying candidates.   Vocational Rehab Evaluation & Intervention: Vocational Rehab - 03/21/18 1844      Initial Vocational Rehab Evaluation & Intervention   Assessment shows need for Vocational Rehabilitation  No       Education: Education Goals: Education classes will be provided on a variety of topics geared toward better understanding of heart health and risk factor modification. Participant will state understanding/return demonstration of topics presented as noted by education test scores.  Learning Barriers/Preferences:   Education Topics:  AED/CPR: - Group verbal and written instruction with the use of models to demonstrate the basic use of the AED with the basic ABC's of resuscitation.   Cardiac Rehab from 04/27/2018 in [facility] Cardiac and Pulmonary Rehab  Date  03/30/18  Educator  CE  Instruction  Review Code  1- Verbalizes Understanding      General Nutrition Guidelines/Fats and Fiber: -Group instruction provided by verbal, written material, models and posters to present the general guidelines for heart healthy nutrition. Gives an explanation and review of dietary fats and fiber.   Cardiac Rehab from 04/27/2018 in [facility] Cardiac and Pulmonary Rehab  Date  03/21/18  Educator  PI  Instruction Review Code  1- Verbalizes Understanding      Controlling Sodium/Reading Food Labels: -Group verbal and written material supporting the discussion of sodium use in heart healthy nutrition. Review and explanation with models, verbal and written materials for utilization of the food label.   Cardiac Rehab from 04/27/2018 in [facility] Cardiac and Pulmonary Rehab  Date  03/28/18  Educator  PI  Instruction Review Code  1- Verbalizes Understanding      Exercise Physiology & General Exercise Guidelines: - Group verbal and written instruction with models to review the exercise physiology of the cardiovascular system and associated critical values. Provides general exercise guidelines with specific guidelines to those with heart or lung disease.    Cardiac Rehab from 04/27/2018 in [facility] Cardiac and Pulmonary Rehab  Date  04/06/18  Educator  AS  Instruction Review Code  1- Verbalizes Understanding      Aerobic Exercise & Resistance Training: - Gives group verbal and written instruction on the various components of exercise. Focuses on aerobic and resistive training programs and the benefits of this training and how to safely progress through these programs..   Cardiac Rehab from 04/27/2018 in [facility] Cardiac and Pulmonary Rehab  Date  04/11/18  Educator  [facility]  Instruction Review Code  1- Geologist, engineering, Balance, Mind/Body Relaxation: Provides group verbal/written instruction on the benefits of flexibility and balance training, including mind/body exercise modes such as yoga, pilates  and tai chi.  Demonstration and skill practice provided.   Stress and Anxiety: - Provides group verbal and written instruction about the  health risks of elevated stress and causes of high stress.  Discuss the correlation between heart/lung disease and anxiety and treatment options. Review healthy ways to manage with stress and anxiety.   Cardiac Rehab from 04/27/2018 in [facility] Cardiac and Pulmonary Rehab  Date  04/27/18  Educator  [facility]  Instruction Review Code  1- Verbalizes Understanding      Depression: - Provides group verbal and written instruction on the correlation between heart/lung disease and depressed mood, treatment options, and the stigmas associated with seeking treatment.   Anatomy & Physiology of the Heart: - Group verbal and written instruction and models provide basic cardiac anatomy and physiology, with the coronary electrical and arterial systems. Review of Valvular disease and Heart Failure   Cardiac Procedures: - Group verbal and written instruction to review commonly prescribed medications for heart disease. Reviews the medication, class of the drug, and side effects. Includes the steps to properly store meds and maintain the prescription regimen. (beta blockers and nitrates)   Cardiac Medications I: - Group verbal and written instruction to review commonly prescribed medications for heart disease. Reviews the medication, class of the drug, and side effects. Includes the steps to properly store meds and maintain the prescription regimen.   Cardiac Rehab from 05/02/2018 in [facility] Cardiac and Pulmonary Rehab  Date  05/02/18  Educator  CE  Instruction Review Code  1- Verbalizes Understanding      Cardiac Medications II: -Group verbal and written instruction to review commonly prescribed medications for heart disease. Reviews the medication, class of the drug, and side effects. (all other drug classes)   Cardiac Rehab from 04/27/2018 in [facility] Cardiac and Pulmonary Rehab  Date   04/20/18  Educator  [facility]  Instruction Review Code  1- Verbalizes Understanding       Go Sex-Intimacy & Heart Disease, Get SMART - Goal Setting: - Group verbal and written instruction through game format to discuss heart disease and the return to sexual intimacy. Provides group verbal and written material to discuss and apply goal setting through the application of the S.M.A.R.T. Method.   Other Matters of the Heart: - Provides group verbal, written materials and models to describe Stable Angina and Peripheral Artery. Includes description of the disease process and treatment options available to the cardiac patient.   Exercise & Equipment Safety: - Individual verbal instruction and demonstration of equipment use and safety with use of the equipment.   Cardiac Rehab from 04/27/2018 in [facility] Cardiac and Pulmonary Rehab  Date  11/25/17  Educator  mc  Instruction Review Code  1- Verbalizes Understanding      Infection Prevention: - Provides verbal and written material to individual with discussion of infection control including proper hand washing and proper equipment cleaning during exercise session.   Cardiac Rehab from 04/27/2018 in [facility] Cardiac and Pulmonary Rehab  Date  11/25/17  Educator  mc  Instruction Review Code  1- Verbalizes Understanding      Falls Prevention: - Provides verbal and written material to individual with discussion of falls prevention and safety.   Cardiac Rehab from 04/27/2018 in [facility] Cardiac and Pulmonary Rehab  Date  11/25/17  Educator  mc  Instruction Review Code  1- Verbalizes Understanding      Diabetes: - Individual verbal and written instruction to review signs/symptoms of diabetes, desired ranges of glucose level fasting, after meals and with exercise. Acknowledge that pre and post exercise glucose checks will be done for 3 sessions at entry of program.  Know Your Numbers and Risk Factors: -Group verbal and written instruction about important  numbers in your health.  Discussion of what are risk factors and how they play a role in the disease process.  Review of Cholesterol, Blood Pressure, Diabetes, and BMI and the role they play in your overall health.   Cardiac Rehab from 04/27/2018 in [facility] Cardiac and Pulmonary Rehab  Date  04/20/18  Educator  [facility]  Instruction Review Code  1- Verbalizes Understanding      Sleep Hygiene: -Provides group verbal and written instruction about how sleep can affect your health.  Define sleep hygiene, discuss sleep cycles and impact of sleep habits. Review good sleep hygiene tips.    Other: -Provides group and verbal instruction on various topics (see comments)   Knowledge Questionnaire Score:   Core Components/Risk Factors/Patient Goals at Admission:   Core Components/Risk Factors/Patient Goals Review:  Goals and Risk Factor Review    Row Name 04/07/18 1708 05/02/18 1616           Core Components/Risk Factors/Patient Goals Review   Personal Goals Review  Weight Management/Obesity;Stress;Tobacco Cessation;Hypertension;Lipids  Weight Management/Obesity;Lipids;Hypertension;Stress;Tobacco Cessation      Review  Angela Mccullough is down to 3-4 cigarettes a day.  Her stress level is better since she is settled after moving.  She is watching sodium and eating more fruits and vegetables.  She has appt with RD in 2 weeks.  Pt reports taking all meds as directed.  BP has been normal during HT classes.  She met with RD last week for dietary changes.  She is meeting with Angela Mccullough today about stress.      Expected Outcomes  Short - Angela Mccullough will continue to attend and modify diet to be more healthy [place] will exercise on her own and maintain healthy lifestyle changes  Short - Pt will continue to exercise to help manage stress and keep heart healthy .  She is still smiking 3 cigarettes per day  Long - Pt will quit smoking and will reach weight goals, maintain exercise on her own         Core Components/Risk  Factors/Patient Goals at Discharge (Final Review):  Goals and Risk Factor Review - 05/02/18 1616      Core Components/Risk Factors/Patient Goals Review   Personal Goals Review  Weight Management/Obesity;Lipids;Hypertension;Stress;Tobacco Cessation    Review  Pt reports taking all meds as directed.  BP has been normal during HT classes.  She met with RD last week for dietary changes.  She is meeting with Angela Mccullough today about stress.    Expected Outcomes  Short - Pt will continue to exercise to help manage stress and keep heart healthy .  She is still smiking 3 cigarettes per day  Long - Pt will quit smoking and will reach weight goals, maintain exercise on her own       ITP Comments: ITP Comments    Row Name 03/23/18 0646 04/20/18 0625 04/25/18 1708 05/18/18 0628 06/15/18 0627   ITP Comments  30 Day review. Continue with ITP unless directed changes per Medical Director review.   Returns as new start after lengthy absence  30 day review. Continue with ITP unless directed changes per Medical Director  Rilyn reports a spider bite that is being treated with antibiotics  30 day review. Continue with ITP unless directed changes per Medical Director  Last visit 05/02/2018  30 day review. Continue with ITP unless directed changes per Medical Director review   Out since 5/6  Comments: discharge ITP

## 2018-06-16 NOTE — Progress Notes (Signed)
Discharge Progress Report  Patient Details  Name: Angela Mccullough MRN: [id number] Date of Birth: 1966-06-08 Referring Provider:     Cardiac Rehab from 03/21/2018 in [facility] Cardiac and Pulmonary Rehab  Referring Provider  Angela Mccullough       Number of Visits: 24  Reason for Discharge:  Early Exit:  Lack of attendance  Smoking History:  Social History   Tobacco Use  Smoking Status Current Every Day Smoker  . Packs/day: 0.25  . Types: Cigarettes  Smokeless Tobacco Current User  Tobacco Comment   down to 4 cigs per day    Diagnosis:  No diagnosis found.  ADL UCSD:   Initial Exercise Prescription: Initial Exercise Prescription - 03/21/18 1600      Date of Initial Exercise RX and Referring Provider   Date  03/21/18    Referring Provider  Angela Mccullough      Treadmill   MPH  2.5    Grade  1.5    Minutes  15    METs  3.5      Recumbant Bike   Level  5    RPM  60    Watts  45    Minutes  15    METs  3.5      REL-XR   Level  3    Speed  50    Minutes  15    METs  3.5      Prescription Details   Frequency (times per week)  3    Duration  Progress to 45 minutes of aerobic exercise without signs/symptoms of physical distress      Intensity   THRR 40-80% of Max Heartrate  119-152    Ratings of Perceived Exertion  11-13    Perceived Dyspnea  0-4      Resistance Training   Training Prescription  Yes    Weight  3 lb    Reps  10-15       Discharge Exercise Prescription (Final Exercise Prescription Changes): Exercise Prescription Changes - 04/27/18 1400      Response to Exercise   Blood Pressure (Admit)  134/68    Blood Pressure (Exercise)  152/72    Blood Pressure (Exit)  128/78    Heart Rate (Admit)  99 bpm    Heart Rate (Exercise)  128 bpm    Heart Rate (Exit)  88 bpm    Rating of Perceived Exertion (Exercise)  13    Symptoms  none    Duration  Continue with 45 min of aerobic exercise without signs/symptoms of physical distress.    Intensity  THRR  unchanged      Progression   Progression  Continue to progress workloads to maintain intensity without signs/symptoms of physical distress.    Average METs  3.78      Resistance Training   Training Prescription  Yes    Weight  3 lb    Reps  10-15      Interval Training   Interval Training  No      Treadmill   MPH  2.5    Grade  3    Minutes  15    METs  3.6      Recumbant Bike   Level  5    RPM  60    Watts  44    Minutes  15    METs  3.6      Home Exercise Plan   Plans to continue exercise at  [employer] (comment)  [employer]    Frequency  Add 1 additional day to program exercise sessions.    Initial Home Exercises Provided  04/07/18       Functional Capacity: 6 Minute Walk    Row Name 03/21/18 1623         6 Minute Walk   Distance  1400 feet     Walk Time  6 minutes     # of Rest Breaks  0     METS  3.66     RPE  12     Perceived Dyspnea   1     VO2 Peak  12.82     Symptoms  Yes (comment)     Comments  headache 2/10 - happens when she exerts - went away after walk test     Resting HR  87 bpm     Resting BP  116/62     Resting Oxygen Saturation   100 %     Exercise Oxygen Saturation  during 6 min walk  99 %     Max Ex. HR  100 bpm     Max Ex. BP  132/66     2 Minute Post BP  104/56        Psychological, QOL, Others - Outcomes: PHQ 2/9: Depression screen Angela Mccullough 2/9 03/21/2018 11/25/2017 01/20/2016 12/12/2015 10/14/2015  Decreased Interest 1 2 0 0 0  Down, Depressed, Hopeless 1 3 0 0 0  PHQ - 2 Score 2 5 0 0 0  Altered sleeping 0 1 - - -  Tired, decreased energy 3 3 - - -  Change in appetite 0 3 - - -  Feeling bad or failure about yourself  0 0 - - -  Trouble concentrating 1 1 - - -  Moving slowly or fidgety/restless 0 0 - - -  Suicidal thoughts 0 0 - - -  PHQ-9 Score 6 13 - - -  Difficult doing work/chores Somewhat difficult Somewhat difficult - - -    Quality of Life: Quality of Life - 03/21/18 1844      Quality of Life Scores    Health/Function Pre  22.5 %    Socioeconomic Pre  24.29 %    Psych/Spiritual Pre  19.5 %    Family Pre  22.1 %    GLOBAL Pre  22.19 %       Personal Goals: Goals established at orientation with interventions provided to work toward goal.    Personal Goals Discharge: Goals and Risk Factor Review    Row Name 04/07/18 1708 05/02/18 1616           Core Components/Risk Factors/Patient Goals Review   Personal Goals Review  Weight Management/Obesity;Stress;Tobacco Cessation;Hypertension;Lipids  Weight Management/Obesity;Lipids;Hypertension;Stress;Tobacco Cessation      Review  Angela Mccullough is down to 3-4 cigarettes a day.  Her stress level is better since she is settled after moving.  She is watching sodium and eating more fruits and vegetables.  She has appt with RD in 2 weeks.  Pt reports taking all meds as directed.  BP has been normal during HT classes.  She met with RD last week for dietary changes.  She is meeting with Angela Mccullough today about stress.      Expected Outcomes  Short - [name] will continue to attend and modify diet to be more healthy [place] will exercise on her own and maintain healthy lifestyle changes  Short - Pt will continue to exercise to help manage stress and  keep heart healthy .  She is still smiking 3 cigarettes per day  Long - Pt will quit smoking and will reach weight goals, maintain exercise on her own         Exercise Goals and Review: Exercise Goals    Row Name 03/21/18 1623             Exercise Goals   Increase Physical Activity  Yes       Intervention  Provide advice, education, support and counseling about physical activity/exercise needs.;Develop an individualized exercise prescription for aerobic and resistive training based on initial evaluation findings, risk stratification, comorbidities and participant's personal goals.       Expected Outcomes  Short Term: Attend rehab on a regular basis to increase amount of physical activity.;Long Term: Add in  home exercise to make exercise part of routine and to increase amount of physical activity.;Long Term: Exercising regularly at least 3-5 days a week.       Increase Strength and Stamina  Yes       Intervention  Provide advice, education, support and counseling about physical activity/exercise needs.;Develop an individualized exercise prescription for aerobic and resistive training based on initial evaluation findings, risk stratification, comorbidities and participant's personal goals.       Expected Outcomes  Short Term: Increase workloads from initial exercise prescription for resistance, speed, and METs.;Short Term: Perform resistance training exercises routinely during rehab and add in resistance training at home;Long Term: Improve cardiorespiratory fitness, muscular endurance and strength as measured by increased METs and functional capacity (6MWT)       Able to understand and use rate of perceived exertion (RPE) scale  Yes       Intervention  Provide education and explanation on how to use RPE scale       Expected Outcomes  Short Term: Able to use RPE daily in rehab to express subjective intensity level;Long Term:  Able to use RPE to guide intensity level when exercising independently       Able to understand and use Dyspnea scale  Yes       Intervention  Provide education and explanation on how to use Dyspnea scale       Expected Outcomes  Short Term: Able to use Dyspnea scale daily in rehab to express subjective sense of shortness of breath during exertion;Long Term: Able to use Dyspnea scale to guide intensity level when exercising independently       Knowledge and understanding of Target Heart Rate Range (THRR)  Yes       Intervention  Provide education and explanation of THRR including how the numbers were predicted and where they are located for reference       Expected Outcomes  Short Term: Able to state/look up THRR;Long Term: Able to use THRR to govern intensity when exercising  independently;Short Term: Able to use daily as guideline for intensity in rehab       Able to check Mccullough independently  Yes       Intervention  Provide education and demonstration on how to check Mccullough in carotid and radial arteries.;Review the importance of being able to check your own Mccullough for safety during independent exercise       Expected Outcomes  Short Term: Able to explain why Mccullough checking is important during independent exercise;Long Term: Able to check Mccullough independently and accurately       Understanding of Exercise Prescription  Yes       Intervention  Provide education,  explanation, and written materials on patient's individual exercise prescription       Expected Outcomes  Short Term: Able to explain program exercise prescription;Long Term: Able to explain home exercise prescription to exercise independently          Nutrition & Weight - Outcomes: Pre Biometrics - 03/21/18 1622      Pre Biometrics   Height  5' 1.75" (1.568 m)    Weight  187 lb 11.2 oz (85.1 kg)    Waist Circumference  40 inches    Hip Circumference  49 inches    Waist to Hip Ratio  0.82 %    BMI (Calculated)  34.63        Nutrition: Nutrition Therapy & Goals - 04/20/18 0911      Nutrition Therapy   Diet  TLC    Drug/Food Interactions  Statins/Certain Fruits    Protein (specify units)  6-7oz    Fiber  25 grams    Whole Grain Foods  3 servings    Saturated Fats  11 max. grams    Fruits and Vegetables  5 servings/day 8 ideal    Sodium  2000 grams      Personal Nutrition Goals   Nutrition Goal  Decrease total calories coming from beverages, for example sweet tea and coffee. Choose low or zero calorie varieties    Personal Goal #2  Increase daily fiber intake to 25g/day to help lower cholesterol and increase satiety. Fruits, vegetables and whole grains all contain fiber    Personal Goal #3  Limit the amount of fried foods you eat. Choose vegetables or a baked potato as a side when eating out,  and choose lean non-fried meats instead    Personal Goal #4  Use the nutrition facts label to identify good and bad fats, total fat, and sodium    Comments  She has been increasing her intake of vegetables. Eats out or chooses frozen meals daily for dinner but doest bring her lunch and breakfast to work daily.      Intervention Plan   Intervention  Prescribe, educate and counsel regarding individualized specific dietary modifications aiming towards targeted core components such as weight, hypertension, lipid management, diabetes, heart failure and other comorbidities.;Nutrition handout(s) given to patient. Guidelines for Losing Weight handout & Goals    Expected Outcomes  Short Term Goal: Understand basic principles of dietary content, such as calories, fat, sodium, cholesterol and nutrients.;Short Term Goal: A plan has been developed with personal nutrition goals set during dietitian appointment.;Long Term Goal: Adherence to prescribed nutrition plan.       Nutrition Discharge:   Education Questionnaire Score:   Goals reviewed with patient; copy given to patient.

## 2018-06-23 DIAGNOSIS — R635 Abnormal weight gain: Secondary | ICD-10-CM | POA: Insufficient documentation

## 2018-07-18 ENCOUNTER — Telehealth: Payer: Self-pay | Admitting: Family Medicine

## 2018-07-18 ENCOUNTER — Ambulatory Visit (INDEPENDENT_AMBULATORY_CARE_PROVIDER_SITE_OTHER): Payer: BC Managed Care – PPO | Admitting: Family Medicine

## 2018-07-18 ENCOUNTER — Encounter: Payer: Self-pay | Admitting: Family Medicine

## 2018-07-18 VITALS — BP 112/62 | HR 61 | Resp 15 | Ht 61.0 in | Wt 192.4 lb

## 2018-07-18 DIAGNOSIS — I252 Old myocardial infarction: Secondary | ICD-10-CM

## 2018-07-18 DIAGNOSIS — E785 Hyperlipidemia, unspecified: Secondary | ICD-10-CM | POA: Insufficient documentation

## 2018-07-18 DIAGNOSIS — Z1231 Encounter for screening mammogram for malignant neoplasm of breast: Secondary | ICD-10-CM | POA: Diagnosis not present

## 2018-07-18 DIAGNOSIS — Z0000 Encounter for general adult medical examination without abnormal findings: Secondary | ICD-10-CM

## 2018-07-18 DIAGNOSIS — Z1211 Encounter for screening for malignant neoplasm of colon: Secondary | ICD-10-CM | POA: Diagnosis not present

## 2018-07-18 DIAGNOSIS — J301 Allergic rhinitis due to pollen: Secondary | ICD-10-CM | POA: Diagnosis not present

## 2018-07-18 DIAGNOSIS — F418 Other specified anxiety disorders: Secondary | ICD-10-CM

## 2018-07-18 DIAGNOSIS — R635 Abnormal weight gain: Secondary | ICD-10-CM

## 2018-07-18 DIAGNOSIS — Z1239 Encounter for other screening for malignant neoplasm of breast: Secondary | ICD-10-CM

## 2018-07-18 DIAGNOSIS — R05 Cough: Secondary | ICD-10-CM | POA: Diagnosis not present

## 2018-07-18 DIAGNOSIS — E559 Vitamin D deficiency, unspecified: Secondary | ICD-10-CM

## 2018-07-18 DIAGNOSIS — F17210 Nicotine dependence, cigarettes, uncomplicated: Secondary | ICD-10-CM

## 2018-07-18 DIAGNOSIS — R7309 Other abnormal glucose: Secondary | ICD-10-CM | POA: Diagnosis not present

## 2018-07-18 DIAGNOSIS — Z124 Encounter for screening for malignant neoplasm of cervix: Secondary | ICD-10-CM

## 2018-07-18 DIAGNOSIS — F4310 Post-traumatic stress disorder, unspecified: Secondary | ICD-10-CM | POA: Diagnosis not present

## 2018-07-18 DIAGNOSIS — E782 Mixed hyperlipidemia: Secondary | ICD-10-CM | POA: Diagnosis not present

## 2018-07-18 DIAGNOSIS — R053 Chronic cough: Secondary | ICD-10-CM

## 2018-07-18 MED ORDER — ALBUTEROL SULFATE HFA 108 (90 BASE) MCG/ACT IN AERS: 2.0000 | INHALATION_SPRAY | Freq: Four times a day (QID) | RESPIRATORY_TRACT | 5 refills | Status: DC | PRN

## 2018-07-18 NOTE — Progress Notes (Signed)
Subjective:    Patient ID: Angela Mccullough, female    DOB: 20-Jan-1966, 52 y.o.   MRN: [id number]  Angela Mccullough is a 52 y.o. female presenting on 07/18/2018 for Annual Exam and Nicotine Dependence (patient would like to talk about medications to help stop)   HPI   Here for Annual Physical and due for fasting labs today  History of MI STEMI / HTN / Fluid Retention and Weight Gain - Reports prior history before this episode that she was hospitalized on 10/26/17, she had acute jaw pain and nausea, almost felt like panic attack, and she went to hospital, dx with STEMI. She was treated with Cardiac Cath and angioplasty without stent placement. She was started on med management for her heart, Aspirin, Plavix, Coreg, Lipitor, she was not given ACEi due to low BP. She participated in 2 rounds of Cardiac Rehab multiple visits, last ended in May 2019. - Last visit with Cardiology [facility], Dr [name]) 06/15/18, she had issue with swelling and weight gain, she was discontinued on Carvedilol and then switched to ACEi Lisinopril with some improvement, weight down some and swelling improved but still persistent. - Currently doing well without cardiac complaints. Her edema is somewhat improved now. - Lifestyle - Diet: Now working on the [employer], also low salt - Fam history - sister with Hashimoto's Thyroiditis - requesting TSH thyroid testing  PTSD / Chronic Recurrent Depression with Anxiety Followed by [facility] Dr Miles Costain Continues Celexa 40mg  daily Takes OTC Sleep Aid, difficulty falling asleep Anxiety seems to be seasonal. While on Summer break she is less anxious and does better generally.  Nicotine Dependence Active chronic smoker, down to 4 cigs daily. Interested to quit in future.  Chronic Cough / Environmental Allergies Reports several triggers such as change in air temperature, mold, allergies. No formal dx Asthma or COPD. She has a chronic recurrent cough with episodes. Due  for refill Albuterol.  History of Vitamin D Deficiency - due for repeat, testing off Vit D3 currently, was taking 2k daily.  Health Maintenance:  Previously followed by [facility] - reported last pap smear done about 3 years ago, was negative/normal. Prior records available on chart, 06/2009 negative. And she reports a prior abnormal pap smear only x 1 when age 72, "pre-cancerous" - she would like referral back to [facility]  Colon CA Screening: Never had colonoscopy. Currently asymptomatic. Known family history of colon CA with father age 11 Stage IV metastatic. Due for screening test - order Cologuard, counseling given. She was considering Colonoscopy vs Cologuard  Breast CA Screening: Due for mammogram screening. Last mammogram result 2007 done at [facility]. No prior history abnormal mammogram. No known family history of breast cancer. Currently asymptomatic.   Depression screen [facility] 2/9 07/18/2018 03/21/2018 11/25/2017  Decreased Interest 1 1 2   Down, Depressed, Hopeless 1 1 3   PHQ - 2 Score 2 2 5   Altered sleeping 3 0 1  Tired, decreased energy 3 3 3   Change in appetite 1 0 3  Feeling bad or failure about yourself  0 0 0  Trouble concentrating 1 1 1   Moving slowly or fidgety/restless 0 0 0  Suicidal thoughts 0 0 0  PHQ-9 Score 10 6 13   Difficult doing work/chores Not difficult at all Somewhat difficult Somewhat difficult   GAD 7 : Generalized Anxiety Score 07/18/2018  Nervous, Anxious, on Edge 0  Control/stop worrying 3  Worry too much - different things 3  Trouble relaxing 0  Restless 0  Easily annoyed or irritable 0  Afraid - awful might happen 1  Total GAD 7 Score 7  Anxiety Difficulty Not difficult at all    Past Medical History:  Diagnosis Date  . ASD (atrial septal defect), common atrium (single atrium)   . Heart murmur   . Kidney stone    Past Surgical History:  Procedure Laterality Date  . atrium septal defect  1999  . CORONARY/GRAFT ACUTE MI  REVASCULARIZATION N/A 10/26/2017   Procedure: Coronary/Graft Acute MI Revascularization;  Surgeon: Nelva Bush, MD;  Location: [facility];  Service: Cardiovascular;  Laterality: N/A;  . LEFT HEART CATH AND CORONARY ANGIOGRAPHY N/A 10/26/2017   Procedure: LEFT HEART CATH AND CORONARY ANGIOGRAPHY;  Surgeon: Nelva Bush, MD;  Location: [facility];  Service: Cardiovascular;  Laterality: N/A;   Social History   Socioeconomic History  . Marital status: Single    Spouse name: Not on file  . Number of children: Not on file  . Years of education: Not on file  . Highest education level: Not on file  Occupational History  . Not on file  Social Needs  . Financial resource strain: Not on file  . Food insecurity:    Worry: Not on file    Inability: Not on file  . Transportation needs:    Medical: Not on file    Non-medical: Not on file  Tobacco Use  . Smoking status: Current Every Day Smoker    Packs/day: 0.25    Types: Cigarettes  . Smokeless tobacco: Current User  . Tobacco comment: down to 4 cigs per day  Substance and Sexual Activity  . Alcohol use: Yes    Alcohol/week: 0.0 oz    Comment: today  . Drug use: Yes    Types: Marijuana    Comment: Smoked once early today.  Marland Kitchen Sexual activity: Not on file  Lifestyle  . Physical activity:    Days per week: Not on file    Minutes per session: Not on file  . Stress: Not on file  Relationships  . Social connections:    Talks on phone: Not on file    Gets together: Not on file    Attends religious service: Not on file    Active member of club or organization: Not on file    Attends meetings of clubs or organizations: Not on file    Relationship status: Not on file  . Intimate partner violence:    Fear of current or ex partner: Not on file    Emotionally abused: Not on file    Physically abused: Not on file    Forced sexual activity: Not on file  Other Topics Concern  . Not on file  Social History  Narrative  . Not on file   Family History  Problem Relation Age of Onset  . Diabetes Mother   . COPD Mother   . Kidney Stones Mother   . Coronary artery disease Mother   . Kidney Stones Father   . Coronary artery disease Father   . Colon cancer Father 58       Stage IV  . Hashimoto's thyroiditis Sister   . Kidney disease Neg Hx   . Bladder Cancer Neg Hx   . Breast cancer Neg Hx   . Cervical cancer Neg Hx    Current Outpatient Medications on File Prior to Visit  Medication Sig  . aspirin 81 MG chewable tablet Chew 1 tablet (81 mg  total) by mouth daily.  Marland Kitchen atorvastatin (LIPITOR) 80 MG tablet Take 1 tablet (80 mg total) by mouth daily at 6 PM.  . cetirizine (ZYRTEC) 10 MG tablet Take 10 mg by mouth daily.  . citalopram (CELEXA) 40 MG tablet   . clopidogrel (PLAVIX) 75 MG tablet Take 1 tablet (75 mg total) by mouth daily with breakfast.  . diphenhydrAMINE (BENADRYL) 25 MG tablet Take 25 mg by mouth every 4 (four) hours as needed.  Marland Kitchen lisinopril (PRINIVIL,ZESTRIL) 10 MG tablet Take 10 mg by mouth daily.   No current facility-administered medications on file prior to visit.     Review of Systems  Constitutional: Negative for activity change, appetite change, chills, diaphoresis, fatigue and fever.  HENT: Negative for congestion and hearing loss.   Eyes: Negative for visual disturbance.  Respiratory: Negative for apnea, cough, choking, chest tightness, shortness of breath and wheezing.   Cardiovascular: Negative for chest pain, palpitations and leg swelling.  Gastrointestinal: Negative for abdominal pain, anal bleeding, blood in stool, constipation, diarrhea, nausea and vomiting.  Endocrine: Negative for cold intolerance.  Genitourinary: Negative for difficulty urinating, dysuria, frequency, hematuria, menstrual problem, vaginal bleeding, vaginal discharge and vaginal pain.  Musculoskeletal: Negative for arthralgias, back pain and neck pain.  Skin: Negative for rash.    Allergic/Immunologic: Positive for environmental allergies.  Neurological: Negative for dizziness, weakness, light-headedness, numbness and headaches.  Hematological: Negative for adenopathy.  Psychiatric/Behavioral: Negative for behavioral problems, dysphoric mood and sleep disturbance. The patient is not nervous/anxious.    Per HPI unless specifically indicated above     Objective:    BP 112/62 (BP Location: Right Arm, Patient Position: Sitting, Cuff Size: Normal)   Pulse 61   Resp 15   Ht 5\' 1"  (1.549 m)   Wt 192 lb 6.4 oz (87.3 kg)   BMI 36.35 kg/m   Wt Readings from Last 3 Encounters:  07/18/18 192 lb 6.4 oz (87.3 kg)  03/21/18 187 lb 11.2 oz (85.1 kg)  11/25/17 182 lb 14.4 oz (83 kg)    Physical Exam  Constitutional: She is oriented to person, place, and time. She appears well-developed and well-nourished. No distress.  Well-appearing, comfortable, cooperative, obese  HENT:  Head: Normocephalic and atraumatic.  Mouth/Throat: Oropharynx is clear and moist.  Frontal / maxillary sinuses non-tender. Nares patent without purulence or edema. Bilateral TMs clear without erythema, effusion or bulging. Oropharynx clear without erythema, exudates, edema or asymmetry.  Eyes: Pupils are equal, round, and reactive to light. Conjunctivae and EOM are normal. Right eye exhibits no discharge. Left eye exhibits no discharge.  Neck: Normal range of motion. Neck supple. No thyromegaly present.  Cardiovascular: Normal rate, regular rhythm, normal heart sounds and intact distal pulses.  No murmur heard. Pulmonary/Chest: Effort normal and breath sounds normal. No respiratory distress. She has no wheezes. She has no rales.  Abdominal: Soft. Bowel sounds are normal. She exhibits no distension and no mass. There is no tenderness.  Musculoskeletal: Normal range of motion. She exhibits no edema or tenderness.  Upper / Lower Extremities: - Normal muscle tone, strength bilateral upper extremities 5/5,  lower extremities 5/5  Lymphadenopathy:    She has no cervical adenopathy.  Neurological: She is alert and oriented to person, place, and time.  Distal sensation intact to light touch all extremities  Skin: Skin is warm and dry. No rash noted. She is not diaphoretic. No erythema.  Psychiatric: She has a normal mood and affect. Her behavior is normal.  Well groomed, good  eye contact, normal speech and thoughts  Nursing note and vitals reviewed.  Results for orders placed or performed during the hospital encounter of 10/26/17  MRSA PCR Screening  Result Value Ref Range   MRSA by PCR POSITIVE (A) NEGATIVE  CBC with Differential/Platelet  Result Value Ref Range   WBC 16.2 (H) 3.6 - 11.0 K/uL   RBC 4.25 3.80 - 5.20 MIL/uL   Hemoglobin 13.3 12.0 - 16.0 g/dL   HCT 38.9 35.0 - 47.0 %   MCV 91.5 80.0 - 100.0 fL   MCH 31.4 26.0 - 34.0 pg   MCHC 34.2 32.0 - 36.0 g/dL   RDW 12.8 11.5 - 14.5 %   Platelets 532 (H) 150 - 440 K/uL   Neutrophils Relative % 80 %   Neutro Abs 13.0 (H) 1.4 - 6.5 K/uL   Lymphocytes Relative 14 %   Lymphs Abs 2.3 1.0 - 3.6 K/uL   Monocytes Relative 6 %   Monocytes Absolute 0.9 0.2 - 0.9 K/uL   Eosinophils Relative 0 %   Eosinophils Absolute 0.0 0 - 0.7 K/uL   Basophils Relative 0 %   Basophils Absolute 0.1 0 - 0.1 K/uL  Protime-INR  Result Value Ref Range   Prothrombin Time 12.9 11.4 - 15.2 seconds   INR 0.98   APTT  Result Value Ref Range   aPTT 26 24 - 36 seconds  Comprehensive metabolic panel  Result Value Ref Range   Sodium 136 135 - 145 mmol/L   Potassium 3.6 3.5 - 5.1 mmol/L   Chloride 102 101 - 111 mmol/L   CO2 25 22 - 32 mmol/L   Glucose, Bld 139 (H) 65 - 99 mg/dL   BUN 14 6 - 20 mg/dL   Creatinine, Ser 0.61 0.44 - 1.00 mg/dL   Calcium 9.7 8.9 - 10.3 mg/dL   Total Protein 7.6 6.5 - 8.1 g/dL   Albumin 4.0 3.5 - 5.0 g/dL   AST 24 15 - 41 U/L   ALT 20 14 - 54 U/L   Alkaline Phosphatase 85 38 - 126 U/L   Total Bilirubin 0.4 0.3 - 1.2 mg/dL    GFR calc non Af Amer >60 >60 mL/min   GFR calc Af Amer >60 >60 mL/min   Anion gap 9 5 - 15  Troponin I  Result Value Ref Range   Troponin I <0.03 <0.03 ng/mL  Lipid panel  Result Value Ref Range   Cholesterol 213 (H) 0 - 200 mg/dL   Triglycerides 172 (H) <150 mg/dL   HDL 49 >40 mg/dL   Total CHOL/HDL Ratio 4.3 RATIO   VLDL 34 0 - 40 mg/dL   LDL Cholesterol 130 (H) 0 - 99 mg/dL  Basic metabolic panel  Result Value Ref Range   Sodium 137 135 - 145 mmol/L   Potassium 3.8 3.5 - 5.1 mmol/L   Chloride 106 101 - 111 mmol/L   CO2 27 22 - 32 mmol/L   Glucose, Bld 120 (H) 65 - 99 mg/dL   BUN 14 6 - 20 mg/dL   Creatinine, Ser 0.57 0.44 - 1.00 mg/dL   Calcium 8.9 8.9 - 10.3 mg/dL   GFR calc non Af Amer >60 >60 mL/min   GFR calc Af Amer >60 >60 mL/min   Anion gap 4 (L) 5 - 15  CBC  Result Value Ref Range   WBC 11.2 (H) 3.6 - 11.0 K/uL   RBC 3.99 3.80 - 5.20 MIL/uL   Hemoglobin 12.4 12.0 - 16.0 g/dL  HCT 36.3 35.0 - 47.0 %   MCV 91.1 80.0 - 100.0 fL   MCH 31.2 26.0 - 34.0 pg   MCHC 34.2 32.0 - 36.0 g/dL   RDW 12.9 11.5 - 14.5 %   Platelets 512 (H) 150 - 440 K/uL  Troponin I (q 6hr x 3)  Result Value Ref Range   Troponin I 1.84 (HH) <0.03 ng/mL  Troponin I (q 6hr x 3)  Result Value Ref Range   Troponin I 22.76 (HH) <0.03 ng/mL  Troponin I (q 6hr x 3)  Result Value Ref Range   Troponin I 47.18 (HH) <0.03 ng/mL  Hemoglobin A1c  Result Value Ref Range   Hgb A1c MFr Bld 5.4 4.8 - 5.6 %   Mean Plasma Glucose 108.28 mg/dL  Pregnancy, urine  Result Value Ref Range   Preg Test, Ur NEGATIVE NEGATIVE  Urine Drug Screen, Qualitative ([facility] only)  Result Value Ref Range   Tricyclic, Ur Screen NONE DETECTED NONE DETECTED   Amphetamines, Ur Screen NONE DETECTED NONE DETECTED   MDMA (Ecstasy)Ur Screen NONE DETECTED NONE DETECTED   Cocaine Metabolite,Ur [place] NONE DETECTED NONE DETECTED   Opiate, Ur Screen NONE DETECTED NONE DETECTED   Phencyclidine (PCP) Ur S NONE DETECTED NONE DETECTED     Cannabinoid 50 Ng, Ur [place] NONE DETECTED NONE DETECTED   Barbiturates, Ur Screen NONE DETECTED NONE DETECTED   Benzodiazepine, Ur Scrn POSITIVE (A) NONE DETECTED   Methadone Scn, Ur NONE DETECTED NONE DETECTED  Magnesium  Result Value Ref Range   Magnesium 2.0 1.7 - 2.4 mg/dL  Glucose, capillary  Result Value Ref Range   Glucose-Capillary 118 (H) 65 - 99 mg/dL  POCT Activated clotting time  Result Value Ref Range   Activated Clotting Time 235 seconds  POCT Activated clotting time  Result Value Ref Range   Activated Clotting Time 268 seconds  ECHOCARDIOGRAM COMPLETE  Result Value Ref Range   Weight 2,991.2 oz   Height 62 in   BP 122/79 mmHg      Assessment & Plan:   Problem List Items Addressed This Visit    Allergic rhinitis    Stable Secondary to environmental allergies Likely contributing to chronic cough episodes Off nose sprays - may resume if worsening      Chronic cough    Secondary to environmental allergies and smoking history No dx of Asthma or COPD Will refill Albuterol for PRN Remains off Flovent Future follow-up if not improving may need Pulm / PFTs      Relevant Medications   albuterol (PROVENTIL HFA;VENTOLIN HFA) 108 (90 Base) MCG/ACT inhaler   Depression with anxiety    Stable chronic problem, currently improved during summer break, less stressors Followed by Dr Miles Costain [facility] Continues on Celexa 40mg  daily      History of ST elevation myocardial infarction (STEMI)    Currenstly stable and asymptomatic S/p STEMI with PCI without stent in 09/2017 Followed by Dr [name] [facility] Cardiology, last seen 05/2018 On med management currently      Relevant Orders   CBC with Differential/Platelet   Hyperlipidemia    Previously Uncontrolled cholesterol - now on Statin after MI in 09/2017 Last lipid panel 1779 Complicated by CAD  Plan: 1. Continue current meds - Atorvastatin 80mg  daily 2. Continue ASA 81mg  for secondary ASCVD risk reduction 3. Encourage  improved lifestyle - low carb/cholesterol, reduce portion size, continue improving regular exercise  Check fasting lipid today - will follow-up      Relevant Medications  lisinopril (PRINIVIL,ZESTRIL) 10 MG tablet   Other Relevant Orders   Lipid panel   TSH   T4, free   Nicotine dependence    Active smoker, tapered down to 4 cigs Encourage continue smoking cessation Defer rx at this time - will return to discuss further, this was not focus of visit today      PTSD (post-traumatic stress disorder)    Stable chronic problem, currently improved during summer break, less stressors Followed by Dr Miles Costain [facility] Continues on Celexa 40mg  daily Takes OTC Sleep Aid, may benefit from further therapy in future for insomnia vs PTSD      Relevant Orders   CBC with Differential/Platelet   COMPLETE METABOLIC PANEL WITH GFR    Other Visit Diagnoses    Annual physical exam    -  Primary  Updated Health Maintenance information - Refer to [facility] for update Pap Smear - Ordered Mammogram - [facility] - patient to call and schedule - Ordered Cologuard patient will check cost as well Reviewed recent lab results with patient Encouraged improvement to lifestyle with diet and exercise - Goal of weight loss     Relevant Orders   Hemoglobin A1c   CBC with Differential/Platelet   COMPLETE METABOLIC PANEL WITH GFR   Lipid panel   Cervical cancer screening       Relevant Orders   Ambulatory referral to Obstetrics / Gynecology   Breast cancer screening       Relevant Orders   MM DIGITAL SCREENING BILATERAL   Screening for colon cancer      Due for routine colon cancer screening. Never had colonoscopy (not interested) - family history colon cancer father age 14 - we discussed that if father's age was earlier < 75 when identified colon CA then we would recommend colonoscopy as 1st line test. This decision to proceed with cologuard was mutually agreed on and advised her that I would strongly suggest  a future colonoscopy - for instance she can alternate tests, next time if negative can do colonoscopy   - Discussion today about recommendations for either Colonoscopy or Cologuard screening, benefits and risks of screening, interested in Cologuard, understands that if positive then recommendation is for diagnostic colonoscopy to follow-up. - Ordered Cologuard today - she will check cost/coverage    Relevant Orders   Cologuard   Abnormal glucose       Relevant Orders   Hemoglobin A1c   Abnormal weight gain       Relevant Orders   TSH   T4, free   Vitamin D deficiency       Relevant Orders   VITAMIN D 25 Hydroxy (Vit-D Deficiency, Fractures)      Meds ordered this encounter  Medications  . albuterol (PROVENTIL HFA;VENTOLIN HFA) 108 (90 Base) MCG/ACT inhaler    Sig: Inhale 2 puffs into the lungs every 6 (six) hours as needed for wheezing or shortness of breath.    Dispense:  1 Inhaler    Refill:  5    Follow up plan: Return in about 6 months (around 01/18/2019) for 6 month follow-up Cardiology/Psych, meds.  Nobie Putnam, [facility] 07/18/2018, 12:31 PM

## 2018-07-18 NOTE — Assessment & Plan Note (Signed)
Previously Uncontrolled cholesterol - now on Statin after MI in 09/2017 Last lipid panel 6503 Complicated by CAD  Plan: 1. Continue current meds - Atorvastatin 80mg  daily 2. Continue ASA 81mg  for secondary ASCVD risk reduction 3. Encourage improved lifestyle - low carb/cholesterol, reduce portion size, continue improving regular exercise  Check fasting lipid today - will follow-up

## 2018-07-18 NOTE — Patient Instructions (Addendum)
Thank you for coming to the office today.  Referral to GYN for routine Pap Smear screening  [facility]   Address: [address], [place], [place], [place], [place] [address] Hours: 8AM-5PM Phone: [phone] ---------------------------------------------  For Mammogram screening for breast cancer   Call the [facility] below anytime to schedule your own appointment now that order has been placed.  [facility] [place], [place] [address] Phone: [phone]  Colon Cancer Screening: - For all adults age 68+ routine colon cancer screening is highly recommended.     - Recent guidelines from [place] recommend starting age of 7 - Early detection of colon cancer is important, because often there are no warning signs or symptoms, also if found early usually it can be cured. Late stage is hard to treat.  - If you are not interested in Colonoscopy screening (if done and normal you could be cleared for 5 to 10 years until next due), then Cologuard is an excellent alternative for screening test for Colon Cancer. It is highly sensitive for detecting DNA of colon cancer from even the earliest stages. Also, there is NO bowel prep required. - If Cologuard is NEGATIVE, then it is good for 3 years before next due - If Cologuard is POSITIVE, then it is strongly advised to get a Colonoscopy, which allows the GI doctor to locate the source of the cancer or polyp (even very early stage) and treat it by removing it. ------------------------- If you would like to proceed with Cologuard (stool DNA test) - FIRST, call your insurance company and tell them you want to check cost of Cologuard tell them CPT Code [phone] (it may be completely covered and you could get for no cost, OR max cost without any coverage is about $600). Also, keep in mind if you do NOT open the kit, and decide not to do the test, you will  NOT be charged, you should contact the company if you decide not to do the test. - If you want to proceed, you can notify us (phone message, [place], or at next visit) and we will order it for you. The test kit will be delivered to you house within about 1 week. Follow instructions to collect sample, you may call the company for any help or questions, 24/7 telephone support at [phone].  DUE for FASTING BLOOD WORK  TODAY  Please schedule a Follow-up Appointment to: Return in about 6 months (around 01/18/2019) for 6 month follow-up Cardiology/Psych, meds.  If you have any other questions or concerns, please feel free to call the office or send a message through [facility]. You may also schedule an earlier appointment if necessary.  Additionally, you may be receiving a survey about your experience at our office within a few days to 1 week by e-mail or mail. We value your feedback.  Nobie Putnam, DO [facility]

## 2018-07-18 NOTE — Telephone Encounter (Signed)
Pt. Called back said she wanted to do the color guard

## 2018-07-18 NOTE — Assessment & Plan Note (Addendum)
Stable chronic problem, currently improved during summer break, less stressors Followed by Dr Miles Costain [facility] Continues on Celexa 40mg  daily Takes OTC Sleep Aid, may benefit from further therapy in future for insomnia vs PTSD

## 2018-07-18 NOTE — Telephone Encounter (Signed)
Called patient back to confirm, she would like to proceed with Cologuard. She states ins covered 80% of the cost.  Nobie Putnam, DO [facility] 07/18/2018, 4:46 PM

## 2018-07-18 NOTE — Assessment & Plan Note (Signed)
Stable chronic problem, currently improved during summer break, less stressors Followed by Dr Miles Costain [facility] Continues on Celexa 40mg  daily

## 2018-07-18 NOTE — Assessment & Plan Note (Signed)
Currenstly stable and asymptomatic S/p STEMI with PCI without stent in 09/2017 Followed by Dr [name] [facility] Cardiology, last seen 05/2018 On med management currently

## 2018-07-18 NOTE — Assessment & Plan Note (Signed)
Secondary to environmental allergies and smoking history No dx of Asthma or COPD Will refill Albuterol for PRN Remains off Flovent Future follow-up if not improving may need Pulm / PFTs

## 2018-07-18 NOTE — Assessment & Plan Note (Signed)
Stable Secondary to environmental allergies Likely contributing to chronic cough episodes Off nose sprays - may resume if worsening

## 2018-07-18 NOTE — Assessment & Plan Note (Signed)
Active smoker, tapered down to 4 cigs Encourage continue smoking cessation Defer rx at this time - will return to discuss further, this was not focus of visit today

## 2018-07-19 LAB — COMPLETE METABOLIC PANEL WITH GFR
AG RATIO: 1.7 (calc) (ref 1.0–2.5)
ALBUMIN MSPROF: 4.5 g/dL (ref 3.6–5.1)
ALT: 23 U/L (ref 6–29)
AST: 19 U/L (ref 10–35)
Alkaline phosphatase (APISO): 103 U/L (ref 33–130)
BUN: 14 mg/dL (ref 7–25)
CALCIUM: 10.3 mg/dL (ref 8.6–10.4)
CO2: 30 mmol/L (ref 20–32)
CREATININE: 0.67 mg/dL (ref 0.50–1.05)
Chloride: 103 mmol/L (ref 98–110)
GFR, EST AFRICAN AMERICAN: 118 mL/min/{1.73_m2} (ref >=60)
GFR, EST NON AFRICAN AMERICAN: 102 mL/min/{1.73_m2} (ref >=60)
Globulin: 2.6 g/dL (calc) (ref 1.9–3.7)
Glucose, Bld: 86 mg/dL (ref 65–99)
Potassium: 4.7 mmol/L (ref 3.5–5.3)
SODIUM: 139 mmol/L (ref 135–146)
TOTAL PROTEIN: 7.1 g/dL (ref 6.1–8.1)
Total Bilirubin: 0.6 mg/dL (ref 0.2–1.2)

## 2018-07-19 LAB — LIPID PANEL
CHOL/HDL RATIO: 2.4 (calc) (ref <5.0)
Cholesterol: 140 mg/dL (ref <200)
HDL: 59 mg/dL (ref >50)
LDL Cholesterol (Calc): 60 mg/dL (calc)
NON-HDL CHOLESTEROL (CALC): 81 mg/dL (ref <130)
Triglycerides: 125 mg/dL (ref <150)

## 2018-07-19 LAB — CBC WITH DIFFERENTIAL/PLATELET
BASOS PCT: 0.5 %
Basophils Absolute: 42 cells/uL (ref 0–200)
EOS PCT: 2.3 %
Eosinophils Absolute: 193 cells/uL (ref 15–500)
HCT: 40.3 % (ref 35.0–45.0)
HEMOGLOBIN: 13 g/dL (ref 11.7–15.5)
LYMPHS ABS: 3049 {cells}/uL (ref 850–3900)
MCH: 30.4 pg (ref 27.0–33.0)
MCHC: 32.3 g/dL (ref 32.0–36.0)
MCV: 94.2 fL (ref 80.0–100.0)
MPV: 9.6 fL (ref 7.5–12.5)
Monocytes Relative: 7.3 %
NEUTROS ABS: 4502 {cells}/uL (ref 1500–7800)
Neutrophils Relative %: 53.6 %
Platelets: 477 10*3/uL — ABNORMAL HIGH (ref 140–400)
RBC: 4.28 10*6/uL (ref 3.80–5.10)
RDW: 11.9 % (ref 11.0–15.0)
Total Lymphocyte: 36.3 %
WBC: 8.4 10*3/uL (ref 3.8–10.8)
WBCMIX: 613 {cells}/uL (ref 200–950)

## 2018-07-19 LAB — T4, FREE: Free T4: 1.2 ng/dL (ref 0.8–1.8)

## 2018-07-19 LAB — VITAMIN D 25 HYDROXY (VIT D DEFICIENCY, FRACTURES): Vit D, 25-Hydroxy: 27 ng/mL — ABNORMAL LOW (ref 30–100)

## 2018-07-19 LAB — HEMOGLOBIN A1C
EAG (MMOL/L): 6 (calc)
HEMOGLOBIN A1C: 5.4 %{Hb} (ref <5.7)
MEAN PLASMA GLUCOSE: 108 (calc)

## 2018-07-19 LAB — TSH: TSH: 2.02 m[IU]/L

## 2018-07-20 ENCOUNTER — Ambulatory Visit
Admission: RE | Admit: 2018-07-20 | Discharge: 2018-07-20 | Disposition: A | Discharge status: Home / Self Care | Payer: BC Managed Care – PPO | Source: Ambulatory Visit | Attending: Family Medicine | Admitting: Family Medicine

## 2018-07-20 DIAGNOSIS — Z1239 Encounter for other screening for malignant neoplasm of breast: Secondary | ICD-10-CM

## 2018-07-20 DIAGNOSIS — Z1231 Encounter for screening mammogram for malignant neoplasm of breast: Secondary | ICD-10-CM | POA: Insufficient documentation

## 2018-07-27 ENCOUNTER — Ambulatory Visit (INDEPENDENT_AMBULATORY_CARE_PROVIDER_SITE_OTHER): Payer: BC Managed Care – PPO | Admitting: Maternal Newborn

## 2018-07-27 ENCOUNTER — Other Ambulatory Visit (HOSPITAL_COMMUNITY)
Admission: RE | Admit: 2018-07-27 | Discharge: 2018-07-27 | Disposition: A | Discharge status: Home / Self Care | Payer: BC Managed Care – PPO | Source: Ambulatory Visit | Attending: Maternal Newborn | Admitting: Maternal Newborn

## 2018-07-27 ENCOUNTER — Encounter: Payer: Self-pay | Admitting: Maternal Newborn

## 2018-07-27 VITALS — BP 110/70 | HR 72 | Ht 62.0 in | Wt 194.0 lb

## 2018-07-27 DIAGNOSIS — Z01419 Encounter for gynecological examination (general) (routine) without abnormal findings: Secondary | ICD-10-CM | POA: Diagnosis not present

## 2018-07-27 DIAGNOSIS — Z124 Encounter for screening for malignant neoplasm of cervix: Secondary | ICD-10-CM

## 2018-07-27 NOTE — Progress Notes (Signed)
Gynecology Annual Exam  PCP: Olin Hauser, DO  Chief Complaint:  Chief Complaint  Patient presents with  . Referral    [facility] for pap smear    History of Present Illness:Patient is a 52 y.o. [id number] presents for annual exam. The patient has no complaints today.   LMP: No LMP recorded. Patient is postmenopausal. Postmenopausal Bleeding: no  The patient is sexually active. She denies dyspareunia.  The patient occasionally performs self breast exams.  There is no notable family history of breast or ovarian cancer in her family.  The patient wears seatbelts: yes.   The patient has regular exercise: yes.    The patient denies current symptoms of depression.     Review of Systems  Constitutional: Negative.   HENT:       Sneezing  Eyes: Negative.   Respiratory: Positive for cough, shortness of breath and wheezing.        With allergies, uses inhaler as needed  Cardiovascular: Positive for palpitations. Negative for chest pain.       Occasional palpitations with her heart medication  Gastrointestinal: Positive for constipation. Negative for abdominal pain and nausea.  Genitourinary: Negative.   Musculoskeletal: Negative.   Skin: Negative.   Neurological: Negative.   Endo/Heme/Allergies: Positive for environmental allergies. Bruises/bleeds easily.  Psychiatric/Behavioral: Negative.   All other systems reviewed and are negative.   Past Medical History:  Past Medical History:  Diagnosis Date  . ASD (atrial septal defect), common atrium (single atrium)   . 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 Heart attack ([place]) 10/26/2017  . Heart murmur   . Kidney stone     Past Surgical History:  Past Surgical History:  Procedure Laterality Date  . atrium septal defect  1999  . CORONARY/GRAFT ACUTE MI REVASCULARIZATION N/A 10/26/2017   Procedure: Coronary/Graft Acute MI Revascularization;  Surgeon: Nelva Bush, MD;  Location: [facility];  Service: Cardiovascular;  Laterality: N/A;  . LEFT HEART CATH AND CORONARY ANGIOGRAPHY N/A 10/26/2017   Procedure: LEFT HEART CATH AND CORONARY ANGIOGRAPHY;  Surgeon: Nelva Bush, MD;  Location: [facility];  Service: Cardiovascular;  Laterality: N/A;  . WISDOM TOOTH EXTRACTION     age 89 all four    Gynecologic History:  No LMP recorded. Patient is postmenopausal. Last Pap: 07/22/2011. Results were:  NIL and HR HPV negative  Last mammogram: 07/20/2018. Results were: BI-RAD I Obstetric History: [id number]  Family History:  Family History  Problem Relation Age of Onset  . Diabetes Mother        TYPE 1  . COPD Mother   . Kidney Stones Mother   . Coronary artery disease Mother   . Heart failure Mother   . Kidney Stones Father   . Coronary artery disease Father   . Colon cancer Father 33       Stage IV  . Hashimoto's thyroiditis Sister   . Diabetes Maternal Grandmother        TYPE2  . Diabetes Maternal Grandfather        TYPE 2  . Kidney disease Neg Hx   . Bladder Cancer Neg Hx   . Breast cancer Neg Hx   . Cervical cancer Neg Hx     Social History:  Social History   Socioeconomic History  . Marital status: Divorced    Spouse name: Not on file  . Number of children: 2  . Years of education: 67  .  Highest education level: Not on file  Occupational History  . 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 Financial resource strain: Not on file  . Food insecurity:    Worry: Not on file    Inability: Not on file  . Transportation needs:    Medical: Not on file    Non-medical: Not on file  Tobacco Use  . Smoking status: Current Every Day Smoker    Packs/day: 0.25    Types: Cigarettes  . Smokeless tobacco: Never Used  . Tobacco comment: down to 4 cigs per day  Substance and Sexual Activity  . Alcohol use: Never    Alcohol/week: 0.0 oz    Frequency: Never    Comment: 4 yrs sober  . Drug use: Yes    Types:  Marijuana    Comment: Smoked once early today.  Marland Kitchen Sexual activity: Yes    Birth control/protection: Post-menopausal  Lifestyle  . Physical activity:    Days per week: Not on file    Minutes per session: Not on file  . Stress: Not on file  Relationships  . Social connections:    Talks on phone: Not on file    Gets together: Not on file    Attends religious service: Not on file    Active member of club or organization: Not on file    Attends meetings of clubs or organizations: Not on file    Relationship status: Not on file  . Intimate partner violence:    Fear of current or ex partner: Not on file    Emotionally abused: Not on file    Physically abused: Not on file    Forced sexual activity: Not on file  Other Topics Concern  . Not on file  Social History Narrative  . Not on file    Allergies:  Allergies  Allergen Reactions  . Biaxin [Clarithromycin] Nausea And Vomiting    Medications: Prior to Admission medications   Medication Sig Start Date End Date Taking? Authorizing Provider  albuterol (PROVENTIL HFA;VENTOLIN HFA) 108 (90 Base) MCG/ACT inhaler Inhale 2 puffs into the lungs every 6 (six) hours as needed for wheezing or shortness of breath. 07/18/18  Yes Karamalegos, Devonne Doughty, DO  aspirin 81 MG chewable tablet Chew 1 tablet (81 mg total) by mouth daily. 10/29/17  Yes Fritzi Mandes, MD  atorvastatin (LIPITOR) 80 MG tablet Take 1 tablet (80 mg total) by mouth daily at 6 PM. 10/28/17  Yes Fritzi Mandes, MD  cetirizine (ZYRTEC) 10 MG tablet Take 10 mg by mouth daily.   Yes [provider]  citalopram (CELEXA) 40 MG tablet  10/19/17  Yes [provider]  clopidogrel (PLAVIX) 75 MG tablet Take 1 tablet (75 mg total) by mouth daily with breakfast. 10/29/17  Yes Fritzi Mandes, MD  diphenhydrAMINE (BENADRYL) 25 MG tablet Take 25 mg by mouth every 4 (four) hours as needed.   Yes [provider]  lisinopril (PRINIVIL,ZESTRIL) 10 MG tablet Take 10 mg by mouth  daily. 06/15/18 06/15/19 Yes [provider]    Physical Exam Vitals: Blood pressure 110/70, pulse 72, height 5\' 2"  (1.575 m), weight 194 lb (88 kg).  General: NAD HEENT: normocephalic, anicteric Thyroid: no enlargement, no palpable nodules Pulmonary: No increased work of breathing, CTAB Cardiovascular: RRR, no murmurs, rubs or gallops Breast: Breasts symmetrical, no tenderness, no palpable nodules or masses, no skin or nipple retraction present, no nipple discharge.  No axillary or supraclavicular lymphadenopathy. Abdomen:  Soft, non-tender, non-distended.  Umbilicus without lesions.  No hepatomegaly, splenomegaly or masses palpable. No evidence of hernia  Genitourinary:  External: Normal external female genitalia.  Normal  urethral meatus, normal Bartholin's and Skene's glands.    Vagina: Normal vaginal mucosa, no evidence of prolapse.    Cervix: Grossly normal in appearance, no bleeding  Uterus: Non-enlarged, mobile, normal contour.  No CMT  Adnexa: ovaries non-enlarged, no adnexal masses  Rectal: deferred  Lymphatic: no evidence of inguinal lymphadenopathy Extremities: no edema, erythema, or tenderness Neurologic: Grossly intact Psychiatric: mood appropriate, affect full   Assessment: 52 y.o. [id number], routine annual exam.  Plan: Problem List Items Addressed This Visit    None    Visit Diagnoses    Women's annual routine gynecological examination    -  Primary   Relevant Orders   Cytology - PAP   Pap smear for cervical cancer screening       Relevant Orders   Cytology - PAP      1) Mammogram - recommend yearly screening mammogram.  Mammogram is up to date.  2) STI screening was offered and declined.  3) ASCCP guidelines and rationale discussed.  Patient opts for every 3 year screening interval.  4) Osteoporosis  - per USPTF routine screening DEXA at age 12  5) Routine healthcare maintenance including cholesterol, diabetes screening managed by PCP.  6)  Cologuard ordered by PCP.  7) Follow up 1 year for routine annual.  Avel Sensor, CNM 07/27/2018  9:54 AM

## 2018-07-28 LAB — COLOGUARD: Cologuard: POSITIVE

## 2018-07-30 LAB — CYTOLOGY - PAP
DIAGNOSIS: NEGATIVE
HPV (WINDOPATH): NOT DETECTED

## 2018-07-31 ENCOUNTER — Ambulatory Visit
Admission: EM | Admit: 2018-07-31 | Discharge: 2018-07-31 | Disposition: A | Discharge status: Home / Self Care | Payer: BC Managed Care – PPO | Attending: Family Medicine | Admitting: Family Medicine

## 2018-07-31 ENCOUNTER — Other Ambulatory Visit: Payer: Self-pay

## 2018-07-31 ENCOUNTER — Encounter: Payer: Self-pay | Admitting: Emergency Medicine

## 2018-07-31 DIAGNOSIS — F17210 Nicotine dependence, cigarettes, uncomplicated: Secondary | ICD-10-CM | POA: Diagnosis present

## 2018-07-31 DIAGNOSIS — J40 Bronchitis, not specified as acute or chronic: Secondary | ICD-10-CM | POA: Diagnosis not present

## 2018-07-31 MED ORDER — PREDNISONE 10 MG PO TABS: 60.0000 mg | ORAL_TABLET | Freq: Once | ORAL | Status: DC

## 2018-07-31 MED ORDER — DOXYCYCLINE HYCLATE 100 MG PO CAPS: 100.0000 mg | ORAL_CAPSULE | Freq: Two times a day (BID) | ORAL | 0 refills | Status: DC

## 2018-07-31 MED ORDER — PREDNISONE 50 MG PO TABS
60.0000 mg | ORAL_TABLET | Freq: Once | ORAL | Status: AC
  Administered 2018-07-31: 60 mg via ORAL

## 2018-07-31 MED ORDER — PREDNISONE 10 MG PO TABS: 20.0000 mg | ORAL_TABLET | Freq: Every day | ORAL | 0 refills | Status: DC

## 2018-07-31 NOTE — ED Triage Notes (Signed)
Patient c/o non productive cough, wheezing and SOB x 1 week. Patient stated she saw her PCP for her symptoms and was given Albuterol inhaler and Zyrtec with no relief.

## 2018-07-31 NOTE — ED Provider Notes (Signed)
[facility]    CSN: [id number] Arrival date & time: 07/31/18  1317     History   Chief Complaint Chief Complaint  Patient presents with  . Cough  . Wheezing    HPI Angela Mccullough is a 52 y.o. female.   The history is provided by the patient. No language interpreter was used.  Cough  Cough characteristics:  Non-productive and dry Severity:  Moderate Onset quality:  Gradual Duration:  1 week Timing:  Constant Progression:  Unchanged Chronicity:  Recurrent Smoker: yes   Context: upper respiratory infection   Relieved by:  Nothing Worsened by:  Smoking and environmental changes Ineffective treatments:  None tried Associated symptoms: shortness of breath, sinus congestion and wheezing   Associated symptoms: no chest pain, no chills, no ear pain, no fever, no headaches, no myalgias, no rash, no rhinorrhea and no sore throat     Past Medical History:  Diagnosis Date  . ASD (atrial septal defect), common atrium (single atrium)   . 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 Heart attack ([place]) 10/26/2017  . Heart murmur   . Kidney stone     Patient Active Problem List   Diagnosis Date Noted  . Cigarette smoker 07/31/2018  . Bronchitis 07/31/2018  . Hyperlipidemia 07/18/2018  . History of ST elevation myocardial infarction (STEMI) 10/26/2017  . 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 Allergic rhinitis 03/23/2016  . Chronic fatigue 01/20/2016  . Constipation 01/20/2016  . PTSD (post-traumatic stress disorder) 11/20/2015  . Chronic cough 11/20/2015  . Nicotine dependence 10/03/2015  . Depression with anxiety 07/09/2009  . GERD 07/09/2009  . DYSFUNCTIONAL UTERINE BLEEDING 07/09/2009  . NEPHROLITHIASIS, HX OF 07/09/2009    Past Surgical History:  Procedure Laterality Date  . atrium septal defect  1999  . CORONARY/GRAFT ACUTE MI REVASCULARIZATION N/A 10/26/2017   Procedure: Coronary/Graft Acute MI Revascularization;   Surgeon: Nelva Bush, MD;  Location: [facility] [facility];  Service: Cardiovascular;  Laterality: N/A;  . LEFT HEART CATH AND CORONARY ANGIOGRAPHY N/A 10/26/2017   Procedure: LEFT HEART CATH AND CORONARY ANGIOGRAPHY;  Surgeon: Nelva Bush, MD;  Location: [facility];  Service: Cardiovascular;  Laterality: N/A;  . WISDOM TOOTH EXTRACTION     age 88 all four    OB History    Gravida  2   Para  2   Term  2   Preterm      AB      Living  2     SAB      TAB      Ectopic      Multiple      Live Births  2            Home Medications    Prior to Admission medications   Medication Sig Start Date End Date Taking? Authorizing Provider  albuterol (PROVENTIL HFA;VENTOLIN HFA) 108 (90 Base) MCG/ACT inhaler Inhale 2 puffs into the lungs every 6 (six) hours as needed for wheezing or shortness of breath. 07/18/18  Yes Karamalegos, Devonne Doughty, DO  aspirin 81 MG chewable tablet Chew 1 tablet (81 mg total) by mouth daily. 10/29/17  Yes Fritzi Mandes, MD  atorvastatin (LIPITOR) 80 MG tablet Take 1 tablet (80 mg total) by mouth daily at 6 PM. 10/28/17  Yes Fritzi Mandes, MD  cetirizine (ZYRTEC) 10 MG tablet Take 10 mg by mouth daily.   Yes [provider]  citalopram (CELEXA)  40 MG tablet  10/19/17  Yes [provider]  clopidogrel (PLAVIX) 75 MG tablet Take 1 tablet (75 mg total) by mouth daily with breakfast. 10/29/17  Yes Fritzi Mandes, MD  diphenhydrAMINE (BENADRYL) 25 MG tablet Take 25 mg by mouth every 4 (four) hours as needed.   Yes [provider]  lisinopril (PRINIVIL,ZESTRIL) 10 MG tablet Take 10 mg by mouth daily. 06/15/18 06/15/19 Yes [provider]  doxycycline (VIBRAMYCIN) 100 MG capsule Take 1 capsule (100 mg total) by mouth 2 (two) times daily. 01/02/09   Kyndle Schlender, Jeanett Schlein, NP  predniSONE (DELTASONE) 10 MG tablet Take 2 tablets (20 mg total) by mouth daily with breakfast. X 5 days, Disp # 10, no refills Start 09/03/03 04/30/08    Krikor Willet, Jeanett Schlein, NP    Family History Family History  Problem Relation Age of Onset  . Diabetes Mother        TYPE 1  . COPD Mother   . Kidney Stones Mother   . Coronary artery disease Mother   . Heart failure Mother   . Kidney Stones Father   . Coronary artery disease Father   . Colon cancer Father 58       Stage IV  . Hashimoto's thyroiditis Sister   . Diabetes Maternal Grandmother        TYPE2  . Diabetes Maternal Grandfather        TYPE 2  . Kidney disease Neg Hx   . Bladder Cancer Neg Hx   . Breast cancer Neg Hx   . Cervical cancer Neg Hx     Social History Social History   Tobacco Use  . Smoking status: Current Every Day Smoker    Packs/day: 0.25    Types: Cigarettes  . Smokeless tobacco: Never Used  . Tobacco comment: down to 4 cigs per day  Substance Use Topics  . Alcohol use: Never    Alcohol/week: 0.0 oz    Frequency: Never    Comment: 4 yrs sober  . Drug use: Not Currently    Types: Marijuana    Comment: Smoked once early today.     Allergies   Biaxin [clarithromycin]   Review of Systems Review of Systems  Constitutional: Negative for chills and fever.  HENT: Positive for congestion. Negative for ear pain, rhinorrhea and sore throat.   Eyes: Negative.   Respiratory: Positive for cough, shortness of breath and wheezing.   Cardiovascular: Negative for chest pain.  Gastrointestinal: Negative for nausea and vomiting.  Endocrine: Negative.   Genitourinary: Negative for dysuria.  Musculoskeletal: Negative for myalgias.  Skin: Negative for rash.  Allergic/Immunologic: Negative.   Neurological: Negative for headaches.  Hematological: Negative.   Psychiatric/Behavioral: Negative.   All other systems reviewed and are negative.    Physical Exam Triage Vital Signs ED Triage Vitals  Enc Vitals Group     BP 07/31/18 1327 96/62     Pulse Rate 07/31/18 1327 89     Resp 07/31/18 1327 18     Temp 07/31/18 1327 98.3 F (36.8 C)     Temp  Source 07/31/18 1327 Oral     SpO2 07/31/18 1327 97 %     Weight 07/31/18 1325 198 lb (89.8 kg)     Height 07/31/18 1325 5\' 2"  (1.575 m)     Head Circumference --      Peak Flow --      Pain Score 07/31/18 1325 0     Pain Loc --  Pain Edu? --      Excl. in [place]? --    No data found.  Updated Vital Signs BP 96/62 (BP Location: Left Arm)   Pulse 89   Temp 98.3 F (36.8 C) (Oral)   Resp 18   Ht 5\' 2"  (1.575 m)   Wt 198 lb (89.8 kg)   SpO2 97%   BMI 36.21 kg/m   Visual Acuity Right Eye Distance:   Left Eye Distance:   Bilateral Distance:    Right Eye Near:   Left Eye Near:    Bilateral Near:     Physical Exam  Constitutional: She is oriented to person, place, and time. Vital signs are normal. She appears well-developed and well-nourished. She is active and cooperative. No distress.  HENT:  Head: Normocephalic.  Right Ear: Tympanic membrane normal.  Left Ear: Tympanic membrane normal.  Nose: Nose normal.  Mouth/Throat: Uvula is midline and oropharynx is clear and moist.  Eyes: Pupils are equal, round, and reactive to light. Conjunctivae, EOM and lids are normal.  Neck: Trachea normal and normal range of motion. No tracheal deviation present.  Cardiovascular: Regular rhythm, normal heart sounds and normal pulses.  No murmur heard. Pulmonary/Chest: Effort normal and breath sounds normal.  Abdominal: Soft. Normal appearance and bowel sounds are normal. There is no tenderness.  Musculoskeletal: Normal range of motion.  Lymphadenopathy:    She has no cervical adenopathy.  Neurological: She is alert and oriented to person, place, and time. GCS eye subscore is 4. GCS verbal subscore is 5. GCS motor subscore is 6.  Skin: Skin is warm and dry. No rash noted.  Psychiatric: She has a normal mood and affect. Her speech is normal and behavior is normal.  Nursing note and vitals reviewed.    UC Treatments / Results  Labs (all labs ordered are listed, but only abnormal  results are displayed) Labs Reviewed - No data to display  EKG None  Radiology No results found.  Procedures Procedures (including critical care time)  Medications Ordered in UC Medications  predniSONE (DELTASONE) tablet 60 mg (60 mg Oral Given 07/31/18 1337)    Initial Impression / Assessment and Plan / UC Course  I have reviewed the triage vital signs and the nursing notes.  Pertinent labs & imaging results that were available during my care of the patient were reviewed by me and considered in my medical decision making (see chart for details).      Final Clinical Impressions(s) / UC Diagnoses   Final diagnoses:  Bronchitis  Cigarette smoker     Discharge Instructions     Rest,push fluids, take med as directed. Stop smoking. Follow up with PCP. Go to er for chest pain or worsening shortness of breath    ED Prescriptions    Medication Sig Dispense Auth. Provider   predniSONE (DELTASONE) 10 MG tablet Take 2 tablets (20 mg total) by mouth daily with breakfast. X 5 days, Disp # 10, no refills Start 06/03/33 10 tablet Delainey Winstanley, Jeanett Schlein, NP   doxycycline (VIBRAMYCIN) 100 MG capsule Take 1 capsule (100 mg total) by mouth 2 (two) times daily. 20 capsule Mansi Tokar, Jeanett Schlein, NP     Controlled Substance Prescriptions    Tori Milks, NP 19/37/90 1352

## 2018-07-31 NOTE — Discharge Instructions (Addendum)
Rest,push fluids, take med as directed. Stop smoking. Follow up with PCP. Go to er for chest pain or worsening shortness of breath

## 2018-08-10 ENCOUNTER — Telehealth: Payer: Self-pay | Admitting: Family Medicine

## 2018-08-10 ENCOUNTER — Telehealth: Payer: Self-pay

## 2018-08-10 DIAGNOSIS — R195 Other fecal abnormalities: Secondary | ICD-10-CM

## 2018-08-10 NOTE — Telephone Encounter (Signed)
Documenting cologuard result.

## 2018-08-10 NOTE — Telephone Encounter (Signed)
Received fax result from [employer] for [employer]. Tested on 07/29/18 - result was POSITIVE.  I called patient today 8/14 to review this with her, advised her that positive does not diagnose colon cancer or other problem, she understands next step for diagnostic is colonoscopy, we will place referral to [facility] today and she will stay tuned for follow-up plan from them. Briefly reviewed process with scheduling and clean out prior to procedure, questions answered. Of note she does have fam history of colon CA with her father having had it and this is documented already.  Nobie Putnam, [facility] 08/10/2018, 5:06 PM

## 2018-09-28 ENCOUNTER — Ambulatory Visit: Payer: BC Managed Care – PPO | Admitting: Gastroenterology

## 2018-09-28 ENCOUNTER — Other Ambulatory Visit: Payer: Self-pay

## 2018-09-28 ENCOUNTER — Encounter: Payer: Self-pay | Admitting: Gastroenterology

## 2018-09-28 VITALS — BP 101/69 | HR 78 | Resp 17 | Ht 62.0 in | Wt 195.2 lb

## 2018-09-28 DIAGNOSIS — R195 Other fecal abnormalities: Secondary | ICD-10-CM | POA: Diagnosis not present

## 2018-09-28 DIAGNOSIS — K219 Gastro-esophageal reflux disease without esophagitis: Secondary | ICD-10-CM

## 2018-09-28 NOTE — Progress Notes (Signed)
Cephas Darby, MD [address]  [place], [place] [address]  Main: [phone]  Fax: [phone]    Gastroenterology Consultation  Referring Provider:     Nobie Putnam * Primary Care Physician:  Olin Hauser, DO Primary Gastroenterologist:  Dr. Cephas Darby Reason for Consultation:     Chronic GERD and Cologuard positive        HPI:   Angela Mccullough is a 52 y.o. female referred by Dr. Parks Ranger, Devonne Doughty, DO  for consultation & management of chronic reflux symptoms, including heartburn, regurgitation.  She denies dysphagia, epigastric pain.  She has been taking  OTC prilosec 1 pill daily for several years, antacid as needed which keeps her symptoms under control.  She otherwise denies any GI symptoms.  She gained about 15 pounds over the last 1 year although she has been following Mediterranean diet.  She uses artificial sweeteners in her coffee, drinks sweetened tea regularly.   Her Cologuard test also came back positive  She has history of premature coronary artery disease, status post angioplasty, has been on DAPT for 1 year.  She states that she will be finishing 1 year course of Plavix by end of this month.   NSAIDs: None  Antiplts/Anticoagulants/Anti thrombotics: DAPT for history of coronary artery disease  GI Procedures: None Father passed away from colon cancer at age 6, never had a colonoscopy in his life Strong family history of coronary artery disease Patient denies any GI surgeries  Past Medical History:  Diagnosis Date  . ASD (atrial septal defect), common atrium (single atrium)   . 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 Heart attack ([place]) 10/26/2017  . Heart murmur   . Kidney stone     Past Surgical History:  Procedure Laterality Date  . atrium septal defect  1999  . CORONARY/GRAFT ACUTE MI REVASCULARIZATION N/A 10/26/2017   Procedure: Coronary/Graft Acute MI Revascularization;  Surgeon:  Nelva Bush, MD;  Location: [facility];  Service: Cardiovascular;  Laterality: N/A;  . LEFT HEART CATH AND CORONARY ANGIOGRAPHY N/A 10/26/2017   Procedure: LEFT HEART CATH AND CORONARY ANGIOGRAPHY;  Surgeon: Nelva Bush, MD;  Location: [facility];  Service: Cardiovascular;  Laterality: N/A;  . WISDOM TOOTH EXTRACTION     age 41 all four    Current Outpatient Medications:  .  albuterol (PROVENTIL HFA;VENTOLIN HFA) 108 (90 Base) MCG/ACT inhaler, Inhale 2 puffs into the lungs every 6 (six) hours as needed for wheezing or shortness of breath., Disp: 1 Inhaler, Rfl: 5 .  aspirin 81 MG chewable tablet, Chew 1 tablet (81 mg total) by mouth daily., Disp: 30 tablet, Rfl: 1 .  atorvastatin (LIPITOR) 80 MG tablet, Take 1 tablet (80 mg total) by mouth daily at 6 PM., Disp: 30 tablet, Rfl: 1 .  cetirizine (ZYRTEC) 10 MG tablet, Take 10 mg by mouth daily., Disp: , Rfl:  .  citalopram (CELEXA) 40 MG tablet, , Disp: , Rfl:  .  diphenhydrAMINE (BENADRYL) 25 MG tablet, Take 25 mg by mouth every 4 (four) hours as needed., Disp: , Rfl:  .  ENSTILAR 0.005-0.064 % FOAM, APPLY TOPICALLY TO THE AFFECTED AREA EVERY DAY, Disp: , Rfl: 0 .  EUCRISA 2 % OINT, APPLY TOPICALLY TO THE AFFECTED AREA EVERY NIGHT AT BEDTIME, Disp: , Rfl: 0 .  hydrochlorothiazide (HYDRODIURIL) 12.5 MG tablet, Take by mouth., Disp: , Rfl:  .  clopidogrel (PLAVIX) 75 MG tablet,  Take 1 tablet (75 mg total) by mouth daily with breakfast. (Patient not taking: Reported on 09/28/2018), Disp: 30 tablet, Rfl: 1 .  doxycycline (VIBRAMYCIN) 100 MG capsule, Take 1 capsule (100 mg total) by mouth 2 (two) times daily. (Patient not taking: Reported on 09/28/2018), Disp: 20 capsule, Rfl: 0 .  lisinopril (PRINIVIL,ZESTRIL) 10 MG tablet, Take 10 mg by mouth daily., Disp: , Rfl:  .  predniSONE (DELTASONE) 10 MG tablet, Take 2 tablets (20 mg total) by mouth daily with breakfast. X 5 days, Disp # 10, no refills Start 08/01/18 (Patient not  taking: Reported on 09/28/2018), Disp: 10 tablet, Rfl: 0    Family History  Problem Relation Age of Onset  . Diabetes Mother        TYPE 1  . COPD Mother   . Kidney Stones Mother   . Coronary artery disease Mother   . Heart failure Mother   . Kidney Stones Father   . Coronary artery disease Father   . Colon cancer Father 48       Stage IV  . Hashimoto's thyroiditis Sister   . Diabetes Maternal Grandmother        TYPE2  . Diabetes Maternal Grandfather        TYPE 2  . Kidney disease Neg Hx   . Bladder Cancer Neg Hx   . Breast cancer Neg Hx   . Cervical cancer Neg Hx      Social History   Tobacco Use  . Smoking status: Current Every Day Smoker    Packs/day: 0.25    Types: Cigarettes  . Smokeless tobacco: Never Used  . Tobacco comment: down to 4 cigs per day  Substance Use Topics  . Alcohol use: Never    Alcohol/week: 0.0 standard drinks    Frequency: Never    Comment: 4 yrs sober  . Drug use: Not Currently    Types: Marijuana    Comment: Smoked once early today.    Allergies as of 09/28/2018 - Review Complete 09/28/2018  Allergen Reaction Noted  . Biaxin [clarithromycin] Nausea And Vomiting 10/14/2015    Review of Systems:    All systems reviewed and negative except where noted in HPI.   Physical Exam:  BP 101/69 (BP Location: Left Arm, Patient Position: Sitting, Cuff Size: Large)   Pulse 78   Resp 17   Ht 5\' 2"  (1.575 m)   Wt 195 lb 3.2 oz (88.5 kg)   BMI 35.70 kg/m  No LMP recorded. Patient is postmenopausal.  General:   Alert,  Well-developed, well-nourished, pleasant and cooperative in NAD Head:  Normocephalic and atraumatic. Eyes:  Sclera clear, no icterus.   Conjunctiva pink. Ears:  Normal auditory acuity. Nose:  No deformity, discharge, or lesions. Mouth:  No deformity or lesions,oropharynx pink & moist. Neck:  Supple; no masses or thyromegaly. Lungs:  Respirations even and unlabored.  Clear throughout to auscultation.   No wheezes, crackles,  or rhonchi. No acute distress. Heart:  Regular rate and rhythm; no murmurs, clicks, rubs, or gallops. Abdomen:  Normal bowel sounds. Soft, non-tender and non-distended without masses, hepatosplenomegaly or hernias noted.  No guarding or rebound tenderness.   Rectal: Not performed Msk:  Symmetrical without gross deformities. Good, equal movement & strength bilaterally. Pulses:  Normal pulses noted. Extremities:  No clubbing or edema.  No cyanosis. Neurologic:  Alert and oriented x3;  grossly normal neurologically. Skin:  Intact without significant lesions or rashes. No jaundice. Lymph Nodes:  No significant cervical adenopathy.  Psych:  Alert and cooperative. Normal mood and affect.  Imaging Studies: Reviewed  Assessment and Plan:   LACE CHENEVERT is a 52 y.o. Caucasian female with metabolic syndrome, coronary disease status post angioplasty, on DAPT, with chronic reflux and positive Cologuard  Chronic reflux: Continue omeprazole 1 pill daily EGD to evaluate for erosive esophagitis and Barrett's  Cologuard positive: Recommend colonoscopy for further evaluation  Patient reports that she will be off Plavix by end of this month.  We can schedule EGD and colonoscopy after this.   Follow up in 2 months   Cephas Darby, MD

## 2018-10-17 ENCOUNTER — Encounter: Payer: Self-pay | Admitting: *Deleted

## 2018-10-17 ENCOUNTER — Ambulatory Visit
Admission: RE | Admit: 2018-10-17 | Discharge: 2018-10-17 | Disposition: A | Discharge status: Home / Self Care | Payer: BC Managed Care – PPO | Source: Ambulatory Visit | Attending: Gastroenterology | Admitting: Gastroenterology

## 2018-10-17 ENCOUNTER — Encounter
Admission: RE | Disposition: A | Discharge status: Home / Self Care | Payer: Self-pay | Source: Ambulatory Visit | Attending: Gastroenterology

## 2018-10-17 ENCOUNTER — Ambulatory Visit: Payer: BC Managed Care – PPO | Admitting: Anesthesiology

## 2018-10-17 DIAGNOSIS — D120 Benign neoplasm of cecum: Secondary | ICD-10-CM

## 2018-10-17 DIAGNOSIS — Z87442 Personal history of urinary calculi: Secondary | ICD-10-CM | POA: Diagnosis not present

## 2018-10-17 DIAGNOSIS — F17210 Nicotine dependence, cigarettes, uncomplicated: Secondary | ICD-10-CM | POA: Insufficient documentation

## 2018-10-17 DIAGNOSIS — K219 Gastro-esophageal reflux disease without esophagitis: Secondary | ICD-10-CM

## 2018-10-17 DIAGNOSIS — Z1211 Encounter for screening for malignant neoplasm of colon: Secondary | ICD-10-CM | POA: Diagnosis not present

## 2018-10-17 DIAGNOSIS — Z13810 Encounter for screening for upper gastrointestinal disorder: Secondary | ICD-10-CM | POA: Diagnosis not present

## 2018-10-17 DIAGNOSIS — Z7982 Long term (current) use of aspirin: Secondary | ICD-10-CM | POA: Diagnosis not present

## 2018-10-17 DIAGNOSIS — D124 Benign neoplasm of descending colon: Secondary | ICD-10-CM

## 2018-10-17 DIAGNOSIS — R195 Other fecal abnormalities: Secondary | ICD-10-CM

## 2018-10-17 DIAGNOSIS — Z79899 Other long term (current) drug therapy: Secondary | ICD-10-CM | POA: Diagnosis not present

## 2018-10-17 DIAGNOSIS — D123 Benign neoplasm of transverse colon: Secondary | ICD-10-CM | POA: Diagnosis not present

## 2018-10-17 DIAGNOSIS — I252 Old myocardial infarction: Secondary | ICD-10-CM | POA: Insufficient documentation

## 2018-10-17 HISTORY — PX: ESOPHAGOGASTRODUODENOSCOPY (EGD) WITH PROPOFOL: SHX5813

## 2018-10-17 HISTORY — PX: COLONOSCOPY WITH PROPOFOL: SHX5780

## 2018-10-17 LAB — POCT PREGNANCY, URINE: Preg Test, Ur: NEGATIVE

## 2018-10-17 SURGERY — ESOPHAGOGASTRODUODENOSCOPY (EGD) WITH PROPOFOL
Anesthesia: General

## 2018-10-17 MED ORDER — PROPOFOL 10 MG/ML IV BOLUS
INTRAVENOUS | Status: AC
  Filled 2018-10-17: qty 20

## 2018-10-17 MED ORDER — SODIUM CHLORIDE 0.9 % IV SOLN
INTRAVENOUS | Status: DC
  Administered 2018-10-17: 08:00:00 via INTRAVENOUS

## 2018-10-17 MED ORDER — PROPOFOL 10 MG/ML IV BOLUS
INTRAVENOUS | Status: DC | PRN
  Administered 2018-10-17: 100 mg via INTRAVENOUS

## 2018-10-17 MED ORDER — LIDOCAINE HCL (PF) 2 % IJ SOLN
INTRAMUSCULAR | Status: AC
  Filled 2018-10-17: qty 10

## 2018-10-17 MED ORDER — PROPOFOL 500 MG/50ML IV EMUL
INTRAVENOUS | Status: DC | PRN
  Administered 2018-10-17: 125 ug/kg/min via INTRAVENOUS

## 2018-10-17 MED ORDER — PROPOFOL 500 MG/50ML IV EMUL
INTRAVENOUS | Status: AC
  Filled 2018-10-17: qty 50

## 2018-10-17 MED ORDER — LIDOCAINE HCL (CARDIAC) PF 100 MG/5ML IV SOSY
PREFILLED_SYRINGE | INTRAVENOUS | Status: DC | PRN
  Administered 2018-10-17: 100 mg via INTRAVENOUS

## 2018-10-17 NOTE — Op Note (Signed)
[facility] Patient Name: Angela Mccullough Procedure Date: 10/17/2018 7:18 AM MRN: [id number] Account #: [id number] Date of Birth: [birth year] Admit Type: Outpatient Age: 52 Room: [facility] ENDO ROOM 2 Gender: Female Note Status: Finalized Procedure:            Colonoscopy Indications:          Positive Cologuard test Providers:            Lin Landsman MD, MD Medicines:            Monitored Anesthesia Care Complications:        No immediate complications. Estimated blood loss:                        Minimal. Procedure:            Pre-Anesthesia Assessment:                       - Prior to the procedure, a History and Physical was                        performed, and patient medications and allergies were                        reviewed. The patient is competent. The risks and                        benefits of the procedure and the sedation options and                        risks were discussed with the patient. All questions                        were answered and informed consent was obtained.                        Patient identification and proposed procedure were                        verified by the physician, the nurse, the                        anesthesiologist, the anesthetist and the technician in                        the pre-procedure area in the procedure room in the                        endoscopy suite. Mental Status Examination: alert and                        oriented. Airway Examination: normal oropharyngeal                        airway and neck mobility. Respiratory Examination:                        clear to auscultation. CV Examination: normal.                        Prophylactic Antibiotics: The patient does not  require                        prophylactic antibiotics. Prior Anticoagulants: The                        patient has taken aspirin, last dose was 1 day prior to                        procedure. ASA Grade  Assessment: III - A patient with                        severe systemic disease. After reviewing the risks and                        benefits, the patient was deemed in satisfactory                        condition to undergo the procedure. The anesthesia plan                        was to use monitored anesthesia care (MAC). Immediately                        prior to administration of medications, the patient was                        re-assessed for adequacy to receive sedatives. The                        heart rate, respiratory rate, oxygen saturations, blood                        pressure, adequacy of pulmonary ventilation, and                        response to care were monitored throughout the                        procedure. The physical status of the patient was                        re-assessed after the procedure.                       After obtaining informed consent, the colonoscope was                        passed under direct vision. Throughout the procedure,                        the patient's blood pressure, pulse, and oxygen                        saturations were monitored continuously. The                        Colonoscope was introduced through the anus and                        advanced to the  the terminal ileum. The colonoscopy was                        performed without difficulty. The patient tolerated the                        procedure well. The quality of the bowel preparation                        was evaluated using the BBPS [facility] Bowel Preparation                        Scale) with scores of: Right Colon = 3, Transverse                        Colon = 3 and Left Colon = 3 (entire mucosa seen well                        with no residual staining, small fragments of stool or                        opaque liquid). The total BBPS score equals 9. Findings:      The perianal and digital rectal examinations were normal. Pertinent       negatives  include normal sphincter tone and no palpable rectal lesions.      A 5 mm polyp was found in the cecum. The polyp was sessile. The polyp       was removed with a cold snare. Resection and retrieval were complete.      Two sessile polyps were found in the transverse colon. The polyps were 8       to 12 mm in size. These polyps were removed with a hot snare. Resection       and retrieval were complete. To prevent bleeding after the polypectomy,       two hemostatic clips were successfully placed (MR conditional). There       was no bleeding during, or at the end, of the procedure.      Four sessile polyps were found in the descending colon. The polyps were       small in size. These polyps were removed with a cold snare. Resection       and retrieval were complete.      The retroflexed view of the distal rectum and anal verge was normal and       showed no anal or rectal abnormalities. Impression:           - One 5 mm polyp in the cecum, removed with a cold                        snare. Resected and retrieved.                       - Two 8 to 12 mm polyps in the transverse colon,                        removed with a hot snare. Resected and retrieved. Clips                        (MR conditional) were placed.                       -  Four small polyps in the descending colon, removed                        with a cold snare. Resected and retrieved.                       - The distal rectum and anal verge are normal on                        retroflexion view. Recommendation:       - Discharge patient to home (with escort).                       - Resume previous diet today.                       - Continue present medications.                       - Await pathology results.                       - Repeat colonoscopy in 2 years for surveillance of                        multiple polyps. Procedure Code(s):    --- Professional ---                       [phone], Colonoscopy, flexible; with  removal of tumor(s),                        polyp(s), or other lesion(s) by snare technique Diagnosis Code(s):    --- Professional ---                       D12.0, Benign neoplasm of cecum                       D12.3, Benign neoplasm of transverse colon (hepatic                        flexure or splenic flexure)                       D12.4, Benign neoplasm of descending colon                       R19.5, Other fecal abnormalities CPT copyright 2018 American Medical Association. All rights reserved. The codes documented in this report are preliminary and upon coder review may  be revised to meet current compliance requirements. Dr. Ulyess Mort Lin Landsman MD, MD 10/17/2018 9:31:08 AM This report has been signed electronically. Number of Addenda: 0 Note Initiated On: 10/17/2018 7:18 AM Scope Withdrawal Time: 0 hours 35 minutes 18 seconds  Total Procedure Duration: 0 hours 40 minutes 21 seconds       [facility]

## 2018-10-17 NOTE — Op Note (Signed)
[facility] Patient Name: Angela Mccullough Procedure Date: 10/17/2018 7:19 AM MRN: [id number] Account #: [id number] Date of Birth: 1966/09/30 Admit Type: Outpatient Age: 52 Room: [facility] ENDO ROOM 2 Gender: Female Note Status: Finalized Procedure:            Upper GI endoscopy Indications:          Gastro-esophageal reflux disease, Screening for                        Barrett's esophagus, Screening for Barrett's esophagus                        in patient at risk for this condition Providers:            Lin Landsman MD, MD Referring MD:         Olin Hauser (Referring MD) Medicines:            Monitored Anesthesia Care Complications:        No immediate complications. Estimated blood loss: None. Procedure:            Pre-Anesthesia Assessment:                       - Prior to the procedure, a History and Physical was                        performed, and patient medications and allergies were                        reviewed. The patient is competent. The risks and                        benefits of the procedure and the sedation options and                        risks were discussed with the patient. All questions                        were answered and informed consent was obtained.                        Patient identification and proposed procedure were                        verified by the physician, the nurse, the                        anesthesiologist, the anesthetist and the technician in                        the pre-procedure area in the procedure room in the                        endoscopy suite. Mental Status Examination: alert and                        oriented. Airway Examination: normal oropharyngeal                        airway and neck mobility. Respiratory Examination:  clear to auscultation. CV Examination: normal.                        Prophylactic Antibiotics: The patient does not  require                        prophylactic antibiotics. Prior Anticoagulants: The                        patient has taken aspirin, last dose was 1 day prior to                        procedure. ASA Grade Assessment: III - A patient with                        severe systemic disease. After reviewing the risks and                        benefits, the patient was deemed in satisfactory                        condition to undergo the procedure. The anesthesia plan                        was to use monitored anesthesia care (MAC). Immediately                        prior to administration of medications, the patient was                        re-assessed for adequacy to receive sedatives. The                        heart rate, respiratory rate, oxygen saturations, blood                        pressure, adequacy of pulmonary ventilation, and                        response to care were monitored throughout the                        procedure. The physical status of the patient was                        re-assessed after the procedure.                       After obtaining informed consent, the endoscope was                        passed under direct vision. Throughout the procedure,                        the patient's blood pressure, pulse, and oxygen                        saturations were monitored continuously. The Endoscope  was introduced through the mouth, and advanced to the                        second part of duodenum. The upper GI endoscopy was                        accomplished without difficulty. The patient tolerated                        the procedure well. Findings:      The duodenal bulb and second portion of the duodenum were normal.      The entire examined stomach was normal.      The cardia and gastric fundus were normal on retroflexion.      The gastroesophageal junction and examined esophagus were normal. Impression:           - Normal  duodenal bulb and second portion of the                        duodenum.                       - Normal stomach.                       - Normal gastroesophageal junction and esophagus.                       - No specimens collected. Recommendation:       - Follow an antireflux regimen.                       - Use Prilosec (omeprazole) 20 mg PO daily.                       - Proceed with colonoscopy as scheduled                       See colonoscopy report Procedure Code(s):    --- Professional ---                       [phone], Esophagogastroduodenoscopy, flexible, transoral;                        diagnostic, including collection of specimen(s) by                        brushing or washing, when performed (separate procedure) Diagnosis Code(s):    --- Professional ---                       K21.9, Gastro-esophageal reflux disease without                        esophagitis                       Z13.810, Encounter for screening for upper                        gastrointestinal disorder CPT copyright 2018 American Medical Association. All rights reserved. The codes documented in this report are preliminary and upon coder review may  be revised  to meet current compliance requirements. Dr. Ulyess Mort Lin Landsman MD, MD 10/17/2018 8:44:21 AM This report has been signed electronically. Number of Addenda: 0 Note Initiated On: 10/17/2018 7:19 AM      [facility]

## 2018-10-17 NOTE — Anesthesia Post-op Follow-up Note (Signed)
Anesthesia QCDR form completed.        

## 2018-10-17 NOTE — Transfer of Care (Signed)
Immediate Anesthesia Transfer of Care Note  Patient: Angela Mccullough  Procedure(s) Performed: ESOPHAGOGASTRODUODENOSCOPY (EGD) WITH PROPOFOL (N/A ) COLONOSCOPY WITH PROPOFOL (N/A )  Patient Location: PACU and Endoscopy Unit  Anesthesia Type:General  Level of Consciousness: awake  Airway & Oxygen Therapy: Patient Spontanous Breathing  Post-op Assessment: Report given to RN  Post vital signs: stable  Last Vitals:  Vitals Value Taken Time  BP 91/39 10/17/2018  9:34 AM  Temp 36.1 C 10/17/2018  9:33 AM  Pulse 65 10/17/2018  9:39 AM  Resp 14 10/17/2018  9:39 AM  SpO2 100 % 10/17/2018  9:39 AM  Vitals shown include unvalidated device data.  Last Pain:  Vitals:   10/17/18 0933  TempSrc: Tympanic  PainSc: 0-No pain         Complications: No apparent anesthesia complications

## 2018-10-17 NOTE — H&P (Signed)
Cephas Darby, MD [address]  [place], [place] [address]  Main: [phone]  Fax: [phone] Pager: [phone]  Primary Care Physician:  Olin Hauser, DO Primary Gastroenterologist:  Dr. Cephas Darby  Pre-Procedure History & Physical: HPI:  Angela Mccullough is a 52 y.o. female is here for an endoscopy and colonoscopy.   Past Medical History:  Diagnosis Date  . ASD (atrial septal defect), common atrium (single atrium)   . 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 Heart attack ([place]) 10/26/2017  . Heart murmur   . Kidney stone     Past Surgical History:  Procedure Laterality Date  . atrium septal defect  1999  . CORONARY ANGIOPLASTY     09/2017  . CORONARY/GRAFT ACUTE MI REVASCULARIZATION N/A 10/26/2017   Procedure: Coronary/Graft Acute MI Revascularization;  Surgeon: Nelva Bush, MD;  Location: [facility];  Service: Cardiovascular;  Laterality: N/A;  . LEFT HEART CATH AND CORONARY ANGIOGRAPHY N/A 10/26/2017   Procedure: LEFT HEART CATH AND CORONARY ANGIOGRAPHY;  Surgeon: Nelva Bush, MD;  Location: [facility];  Service: Cardiovascular;  Laterality: N/A;  . WISDOM TOOTH EXTRACTION     age 58 all four    Prior to Admission medications   Medication Sig Start Date End Date Taking? Authorizing Provider  albuterol (PROVENTIL HFA;VENTOLIN HFA) 108 (90 Base) MCG/ACT inhaler Inhale 2 puffs into the lungs every 6 (six) hours as needed for wheezing or shortness of breath. 07/18/18  Yes Karamalegos, Alexander J, DO  ARIPiprazole (ABILIFY) 10 MG tablet Take 10 mg by mouth daily.   Yes [provider]  aspirin 81 MG chewable tablet Chew 1 tablet (81 mg total) by mouth daily. 10/29/17  Yes Fritzi Mandes, MD  atorvastatin (LIPITOR) 80 MG tablet Take 1 tablet (80 mg total) by mouth daily at 6 PM. 10/28/17   Fritzi Mandes, MD  cetirizine (ZYRTEC) 10 MG tablet Take 10 mg by mouth daily.    [provider]   citalopram (CELEXA) 40 MG tablet  10/19/17   [provider]  clopidogrel (PLAVIX) 75 MG tablet Take 1 tablet (75 mg total) by mouth daily with breakfast. Patient not taking: Reported on 09/28/2018 10/29/17   Fritzi Mandes, MD  diphenhydrAMINE (BENADRYL) 25 MG tablet Take 25 mg by mouth every 4 (four) hours as needed.    [provider]  doxycycline (VIBRAMYCIN) 100 MG capsule Take 1 capsule (100 mg total) by mouth 2 (two) times daily. Patient not taking: Reported on 50/08/3266 12/30/43   Defelice, Jeanett Schlein, NP  ENSTILAR 0.005-0.064 % FOAM APPLY TOPICALLY TO THE AFFECTED AREA EVERY DAY 09/22/18   [provider]  EUCRISA 2 % OINT APPLY TOPICALLY TO THE AFFECTED AREA EVERY NIGHT AT BEDTIME 09/22/18   [provider]  hydrochlorothiazide (HYDRODIURIL) 12.5 MG tablet Take by mouth. 09/13/18 09/13/19  [provider]  lisinopril (PRINIVIL,ZESTRIL) 10 MG tablet Take 10 mg by mouth daily. 06/15/18 06/15/19  [provider]  predniSONE (DELTASONE) 10 MG tablet Take 2 tablets (20 mg total) by mouth daily with breakfast. X 5 days, Disp # 10, no refills Start 08/01/18 Patient not taking: Reported on 80/08/9832 [birth year]   Tori Milks, NP    Allergies as of 09/29/2018 - Review Complete 09/28/2018  Allergen Reaction Noted  . Biaxin [clarithromycin] Nausea And Vomiting 10/14/2015    Family History  Problem Relation Age of Onset  . Diabetes Mother  TYPE 1  . COPD Mother   . Kidney Stones Mother   . Coronary artery disease Mother   . Heart failure Mother   . Kidney Stones Father   . Coronary artery disease Father   . Colon cancer Father 43       Stage IV  . Hashimoto's thyroiditis Sister   . Diabetes Maternal Grandmother        TYPE2  . Diabetes Maternal Grandfather        TYPE 2  . Kidney disease Neg Hx   . Bladder Cancer Neg Hx   . Breast cancer Neg Hx   . Cervical cancer Neg Hx     Social History   Socioeconomic History  . Marital  status: Single    Spouse name: Not on file  . Number of children: 2  . Years of education: 60  . Highest education level: Not on file  Occupational History  . 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 Financial resource strain: Not on file  . Food insecurity:    Worry: Not on file    Inability: Not on file  . Transportation needs:    Medical: Not on file    Non-medical: Not on file  Tobacco Use  . Smoking status: Current Every Day Smoker    Packs/day: 0.25    Types: Cigarettes  . Smokeless tobacco: Never Used  . Tobacco comment: down to 4 cigs per day  Substance and Sexual Activity  . Alcohol use: Never    Alcohol/week: 0.0 standard drinks    Frequency: Never    Comment: 4 yrs sober  . Drug use: Not Currently    Types: Marijuana    Comment: Smoked once early today.  Marland Kitchen Sexual activity: Yes    Birth control/protection: Post-menopausal  Lifestyle  . Physical activity:    Days per week: Not on file    Minutes per session: Not on file  . Stress: Not on file  Relationships  . Social connections:    Talks on phone: Not on file    Gets together: Not on file    Attends religious service: Not on file    Active member of club or organization: Not on file    Attends meetings of clubs or organizations: Not on file    Relationship status: Not on file  . Intimate partner violence:    Fear of current or ex partner: Not on file    Emotionally abused: Not on file    Physically abused: Not on file    Forced sexual activity: Not on file  Other Topics Concern  . Not on file  Social History Narrative  . Not on file    Review of Systems: See HPI, otherwise negative ROS  Physical Exam: BP 119/74   Pulse 80   Temp (!) 96.5 F (35.8 C) (Tympanic)   Resp 18   Ht 5\' 2"  (1.575 m)   Wt 88.5 kg   SpO2 99%   BMI 35.67 kg/m  General:   Alert,  pleasant and cooperative in NAD Head:  Normocephalic and atraumatic. Neck:  Supple; no masses or  thyromegaly. Lungs:  Clear throughout to auscultation.    Heart:  Regular rate and rhythm. Abdomen:  Soft, nontender and nondistended. Normal bowel sounds, without guarding, and without rebound.   Neurologic:  Alert and  oriented x4;  grossly normal neurologically.  Impression/Plan: Angela Mccullough is here for an endoscopy and colonoscopy  to be performed for chronic GERD and cologaurd positive  Risks, benefits, limitations, and alternatives regarding  endoscopy and colonoscopy have been reviewed with the patient.  Questions have been answered.  All parties agreeable.   Sherri Sear, MD  10/17/2018, 8:31 AM

## 2018-10-17 NOTE — Anesthesia Postprocedure Evaluation (Signed)
Anesthesia Post Note  Patient: Angela Mccullough  Procedure(s) Performed: ESOPHAGOGASTRODUODENOSCOPY (EGD) WITH PROPOFOL (N/A ) COLONOSCOPY WITH PROPOFOL (N/A )  Patient location during evaluation: Endoscopy Anesthesia Type: General Level of consciousness: awake and alert Pain management: pain level controlled Vital Signs Assessment: post-procedure vital signs reviewed and stable Respiratory status: spontaneous breathing, nonlabored ventilation and respiratory function stable Cardiovascular status: blood pressure returned to baseline and stable Postop Assessment: no apparent nausea or vomiting Anesthetic complications: no     Last Vitals:  Vitals:   10/17/18 0750 10/17/18 0933  BP: 119/74 (!) 91/39  Pulse: 80 72  Resp: 18 18  Temp: (!) 35.8 C (!) 36.1 C  SpO2: 99% 100%    Last Pain:  Vitals:   10/17/18 0955  TempSrc:   PainSc: 0-No pain                 Alphonsus Sias

## 2018-10-17 NOTE — Anesthesia Preprocedure Evaluation (Signed)
Anesthesia Evaluation  Patient identified by MRN, date of birth, ID band Patient awake    Reviewed: Allergy & Precautions, H&P , NPO status , reviewed documented beta blocker date and time   Airway Mallampati: II  TM Distance: >3 FB Neck ROM: full    Dental  (+) Teeth Intact   Pulmonary Current Smoker,    Pulmonary exam normal        Cardiovascular + Past MI  Normal cardiovascular exam+ Valvular Problems/Murmurs      Neuro/Psych PSYCHIATRIC DISORDERS Anxiety Depression  Neuromuscular disease    GI/Hepatic GERD  ,  Endo/Other    Renal/GU Renal disease     Musculoskeletal   Abdominal   Peds  Hematology   Anesthesia Other Findings Past Medical History: No date: ASD (atrial septal defect), common atrium (single atrium) BIRTH: Congenital anomaly of heart     Comment:  BORN C PVCs 10/26/2017: Heart attack ([place]) No date: Heart murmur No date: Kidney stone  Past Surgical History: 1999: atrium septal defect No date: CORONARY ANGIOPLASTY     Comment:  09/2017 10/26/2017: CORONARY/GRAFT ACUTE MI REVASCULARIZATION; N/A     Comment:  Procedure: Coronary/Graft Acute MI Revascularization;                Surgeon: Nelva Bush, MD;  Location: [facility];  Service: Cardiovascular;  Laterality: N/A; 10/26/2017: LEFT HEART CATH AND CORONARY ANGIOGRAPHY; N/A     Comment:  Procedure: LEFT HEART CATH AND CORONARY ANGIOGRAPHY;                Surgeon: Nelva Bush, MD;  Location: [facility]               [facility];  Service: Cardiovascular;  Laterality: N/A; No date: WISDOM TOOTH EXTRACTION     Comment:  age 82 all four  BMI    Body Mass Index:  35.67 kg/m      Reproductive/Obstetrics                             Anesthesia Physical Anesthesia Plan  ASA: III  Anesthesia Plan: General   Post-op Pain Management:    Induction: Intravenous  PONV Risk Score and  Plan: 2 and Treatment may vary due to age or medical condition and TIVA  Airway Management Planned: Nasal Cannula and Natural Airway  Additional Equipment:   Intra-op Plan:   Post-operative Plan:   Informed Consent: I have reviewed the patients History and Physical, chart, labs and discussed the procedure including the risks, benefits and alternatives for the proposed anesthesia with the patient or authorized representative who has indicated his/her understanding and acceptance.   Dental Advisory Given  Plan Discussed with:   Anesthesia Plan Comments:         Anesthesia Quick Evaluation

## 2018-10-18 ENCOUNTER — Encounter: Payer: Self-pay | Admitting: Gastroenterology

## 2018-10-18 LAB — SURGICAL PATHOLOGY

## 2018-10-19 ENCOUNTER — Encounter: Payer: Self-pay | Admitting: Gastroenterology

## 2018-10-25 ENCOUNTER — Telehealth: Payer: Self-pay | Admitting: Gastroenterology

## 2018-10-25 NOTE — Telephone Encounter (Signed)
PT left vm for pathology report on her procedure

## 2018-10-26 NOTE — Telephone Encounter (Signed)
Patient has been notified of pathology results, pt verbalized understanding

## 2018-11-10 ENCOUNTER — Encounter: Payer: Self-pay | Admitting: Gastroenterology

## 2018-11-10 ENCOUNTER — Ambulatory Visit: Payer: BC Managed Care – PPO | Admitting: Gastroenterology

## 2018-11-10 VITALS — BP 110/76 | HR 91 | Resp 17 | Ht 62.0 in | Wt 197.8 lb

## 2018-11-10 DIAGNOSIS — K219 Gastro-esophageal reflux disease without esophagitis: Secondary | ICD-10-CM | POA: Diagnosis not present

## 2018-11-10 DIAGNOSIS — Z86010 Personal history of colonic polyps: Secondary | ICD-10-CM | POA: Diagnosis not present

## 2018-11-10 NOTE — Progress Notes (Signed)
Cephas Darby, MD [address]  [place]  [place], [place] [address]  Main: [phone]  Fax: [phone]    Gastroenterology Consultation  Referring Provider:     Nobie Putnam * Primary Care Physician:  Olin Hauser, DO Primary Gastroenterologist:  Dr. Cephas Darby Reason for Consultation:     Chronic GERD and Cologuard positive        HPI:   Angela Mccullough is a 52 y.o. female referred by Dr. Parks Ranger, Devonne Doughty, DO  for consultation & management of chronic reflux symptoms, including heartburn, regurgitation.  She denies dysphagia, epigastric pain.  She has been taking  OTC prilosec 1 pill daily for several years, antacid as needed which keeps her symptoms under control.  She otherwise denies any GI symptoms.  She gained about 15 pounds over the last 1 year although she has been following Mediterranean diet.  She uses artificial sweeteners in her coffee, drinks sweetened tea regularly.   Her Cologuard test also came back positive  She has history of premature coronary artery disease, status post angioplasty, has been on DAPT for 1 year.  She states that she will be finishing 1 year course of Plavix by end of this month.   Follow-up visit 11/10/2018 Continues to have intermittent reflux symptoms.  Taking omeprazole 20 mg once a day which keeps her symptoms under control.  She underwent EGD which was unremarkable.  Colonoscopy revealed several subcentimeter polyps, pathology consistent with tubular adenoma only.  She is here to discuss about her procedure results.  Patient reports that she has been trying to eat more vegetables and lean meat, however continues to gain weight.  She is less physically active since her angioplasty.  Underwent cardiac rehab.  She completed Plavix.  Currently on aspirin 81 mg daily  NSAIDs: None  Antiplts/Anticoagulants/Anti thrombotics: Aspirin 81 for coronary artery disease  GI Procedures:  EGD and colonoscopy  10/17/2018 EGD unremarkable Colonoscopy revealed subcentimeter polyps that were resected DIAGNOSIS:  A. COLON POLYP, CECUM; COLD SNARE:  - HYPERPLASTIC POLYP.  - NEGATIVE FOR DYSPLASIA AND MALIGNANCY.   B. COLON POLYP X 2, TRANSVERSE; HOT SNARE:  - TWO SESSILE SERRATED ADENOMAS.  - NEGATIVE FOR CYTOLOGIC DYSPLASIA AND MALIGNANCY.   C. COLON POLYP X 3, DESCENDING; HOT SNARE (2) AND COLD SNARE (1):  - SESSILE SERRATED ADENOMA, LARGEST FRAGMENT, NEGATIVE FOR CYTOLOGIC  DYSPLASIA AND MALIGNANCY.  - HYPERPLASTIC POLYPS, 2 SMALLER FRAGMENTS, NEGATIVE FOR DYSPLASIA AND  MALIGNANCY.   Father passed away from colon cancer at age 12, never had a colonoscopy in his life Strong family history of coronary artery disease Patient denies any GI surgeries  Past Medical History:  Diagnosis Date  . ASD (atrial septal defect), common atrium (single atrium)   . 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 Heart attack ([place]) 10/26/2017  . Heart murmur   . Kidney stone     Past Surgical History:  Procedure Laterality Date  . atrium septal defect  1999  . COLONOSCOPY WITH PROPOFOL N/A 10/17/2018   Procedure: COLONOSCOPY WITH PROPOFOL;  Surgeon: Lin Landsman, MD;  Location: [facility] END[facility];  Service: Gastroenterology;  Laterality: N/A;  . CORONARY ANGIOPLASTY     09/2017  . CORONARY/GRAFT ACUTE MI REVASCULARIZATION N/A 10/26/2017   Procedure: Coronary/Graft Acute MI Revascularization;  Surgeon: Nelva Bush, MD;  Location: [facility];  Service: Cardiovascular;  Laterality: N/A;  . ESOPHAGOGASTRODUODENOSCOPY (EGD) WITH PROPOFOL N/A 10/17/2018   Procedure:  ESOPHAGOGASTRODUODENOSCOPY (EGD) WITH PROPOFOL;  Surgeon: Lin Landsman, MD;  [facility];  Service: Gastroenterology;  Laterality: N/A;  . LEFT HEART CATH AND CORONARY ANGIOGRAPHY N/A 10/26/2017   Procedure: LEFT HEART CATH AND CORONARY ANGIOGRAPHY;  Surgeon: Nelva Bush, MD;  Location: [facility];  Service: Cardiovascular;  Laterality: N/A;  . WISDOM TOOTH EXTRACTION     age 89 all four    Current Outpatient Medications:  .  albuterol (PROVENTIL HFA;VENTOLIN HFA) 108 (90 Base) MCG/ACT inhaler, Inhale 2 puffs into the lungs every 6 (six) hours as needed for wheezing or shortness of breath., Disp: 1 Inhaler, Rfl: 5 .  ARIPiprazole (ABILIFY) 10 MG tablet, Take 10 mg by mouth daily., Disp: , Rfl:  .  aspirin 81 MG chewable tablet, Chew 1 tablet (81 mg total) by mouth daily., Disp: 30 tablet, Rfl: 1 .  atorvastatin (LIPITOR) 80 MG tablet, Take 1 tablet (80 mg total) by mouth daily at 6 PM., Disp: 30 tablet, Rfl: 1 .  cetirizine (ZYRTEC) 10 MG tablet, Take 10 mg by mouth daily., Disp: , Rfl:  .  citalopram (CELEXA) 40 MG tablet, , Disp: , Rfl:  .  diphenhydrAMINE (BENADRYL) 25 MG tablet, Take 25 mg by mouth every 4 (four) hours as needed., Disp: , Rfl:  .  doxycycline (VIBRAMYCIN) 100 MG capsule, Take 1 capsule (100 mg total) by mouth 2 (two) times daily., Disp: 20 capsule, Rfl: 0 .  ENSTILAR 0.005-0.064 % FOAM, APPLY TOPICALLY TO THE AFFECTED AREA EVERY DAY, Disp: , Rfl: 0 .  EUCRISA 2 % OINT, APPLY TOPICALLY TO THE AFFECTED AREA EVERY NIGHT AT BEDTIME, Disp: , Rfl: 0 .  hydrochlorothiazide (HYDRODIURIL) 12.5 MG tablet, Take by mouth., Disp: , Rfl:  .  lisinopril (PRINIVIL,ZESTRIL) 10 MG tablet, Take 10 mg by mouth daily., Disp: , Rfl:  .  predniSONE (DELTASONE) 10 MG tablet, Take 2 tablets (20 mg total) by mouth daily with breakfast. X 5 days, Disp # 10, no refills Start 08/01/18 (Patient not taking: Reported on 09/28/2018), Disp: 10 tablet, Rfl: 0    Family History  Problem Relation Age of Onset  . Diabetes Mother        TYPE 1  . COPD Mother   . Kidney Stones Mother   . Coronary artery disease Mother   . Heart failure Mother   . Kidney Stones Father   . Coronary artery disease Father   . Colon cancer Father 60       Stage IV  . Hashimoto's thyroiditis Sister   .  Diabetes Maternal Grandmother        TYPE2  . Diabetes Maternal Grandfather        TYPE 2  . Kidney disease Neg Hx   . Bladder Cancer Neg Hx   . Breast cancer Neg Hx   . Cervical cancer Neg Hx      Social History   Tobacco Use  . Smoking status: Current Every Day Smoker    Packs/day: 0.25    Types: Cigarettes  . Smokeless tobacco: Never Used  . Tobacco comment: down to 4 cigs per day  Substance Use Topics  . Alcohol use: Never    Alcohol/week: 0.0 standard drinks    Frequency: Never    Comment: 4 yrs sober  . Drug use: Not Currently    Types: Marijuana    Comment: Smoked once early today.    Allergies as of 11/10/2018 - Review Complete 11/10/2018  Allergen Reaction Noted  .  Biaxin [clarithromycin] Nausea And Vomiting 10/14/2015    Review of Systems:    All systems reviewed and negative except where noted in HPI.   Physical Exam:  BP 110/76 (BP Location: Left Arm, Patient Position: Sitting, Cuff Size: Large)   Pulse 91   Resp 17   Ht 5\' 2"  (1.575 m)   Wt 197 lb 12.8 oz (89.7 kg)   BMI 36.18 kg/m  No LMP recorded. Patient is postmenopausal.  General:   Alert,  Well-developed, well-nourished, pleasant and cooperative in NAD Head:  Normocephalic and atraumatic. Eyes:  Sclera clear, no icterus.   Conjunctiva pink. Ears:  Normal auditory acuity. Nose:  No deformity, discharge, or lesions. Mouth:  No deformity or lesions,oropharynx pink & moist. Neck:  Supple; no masses or thyromegaly. Lungs:  Respirations even and unlabored.  Clear throughout to auscultation.   No wheezes, crackles, or rhonchi. No acute distress. Heart:  Regular rate and rhythm; no murmurs, clicks, rubs, or gallops. Abdomen:  Normal bowel sounds. Soft, non-tender and non-distended without masses, hepatosplenomegaly or hernias noted.  No guarding or rebound tenderness.   Rectal: Not performed Msk:  Symmetrical without gross deformities. Good, equal movement & strength bilaterally. Pulses:  Normal  pulses noted. Extremities:  No clubbing or edema.  No cyanosis. Neurologic:  Alert and oriented x3;  grossly normal neurologically. Skin:  Intact without significant lesions or rashes. No jaundice. Lymph Nodes:  No significant cervical adenopathy. Psych:  Alert and cooperative. Normal mood and affect.  Imaging Studies: Reviewed  Assessment and Plan:   KHARTER SESTAK is a 52 y.o. Caucasian female with metabolic syndrome, coronary disease status post angioplasty, on DAPT, with chronic reflux and positive Cologuard  Chronic reflux: EGD negative Continue omeprazole 20 mg daily Reiterated on antireflux lifestyle Advised her to lose weight  History of colon polyps Recommend surveillance colonoscopy in 09/2021  Follow up as needed   Cephas Darby, MD

## 2019-01-16 ENCOUNTER — Ambulatory Visit: Payer: BC Managed Care – PPO | Admitting: Family Medicine

## 2019-02-15 ENCOUNTER — Ambulatory Visit (INDEPENDENT_AMBULATORY_CARE_PROVIDER_SITE_OTHER): Payer: BC Managed Care – PPO | Admitting: Family Medicine

## 2019-02-15 ENCOUNTER — Other Ambulatory Visit: Payer: Self-pay

## 2019-02-15 ENCOUNTER — Encounter: Payer: Self-pay | Admitting: Family Medicine

## 2019-02-15 VITALS — BP 119/69 | HR 72 | Temp 97.9°F | Resp 16 | Ht 62.0 in | Wt 200.0 lb

## 2019-02-15 DIAGNOSIS — F418 Other specified anxiety disorders: Secondary | ICD-10-CM

## 2019-02-15 DIAGNOSIS — I252 Old myocardial infarction: Secondary | ICD-10-CM

## 2019-02-15 DIAGNOSIS — Z9989 Dependence on other enabling machines and devices: Secondary | ICD-10-CM

## 2019-02-15 DIAGNOSIS — R29818 Other symptoms and signs involving the nervous system: Secondary | ICD-10-CM

## 2019-02-15 DIAGNOSIS — F4310 Post-traumatic stress disorder, unspecified: Secondary | ICD-10-CM

## 2019-02-15 DIAGNOSIS — E669 Obesity, unspecified: Secondary | ICD-10-CM

## 2019-02-15 DIAGNOSIS — G4733 Obstructive sleep apnea (adult) (pediatric): Secondary | ICD-10-CM | POA: Insufficient documentation

## 2019-02-15 DIAGNOSIS — R05 Cough: Secondary | ICD-10-CM | POA: Diagnosis not present

## 2019-02-15 DIAGNOSIS — R053 Chronic cough: Secondary | ICD-10-CM

## 2019-02-15 MED ORDER — ALBUTEROL SULFATE HFA 108 (90 BASE) MCG/ACT IN AERS: 2.0000 | INHALATION_SPRAY | Freq: Four times a day (QID) | RESPIRATORY_TRACT | 5 refills | Status: DC | PRN

## 2019-02-15 NOTE — Progress Notes (Signed)
Subjective:    Patient ID: Angela Mccullough, female    DOB: 1966-11-28, 53 y.o.   MRN: [id number]  Angela Mccullough is a 53 y.o. female presenting on 02/15/2019 for Depression; Sleep Apnea; and Post-Traumatic Stress Disorder   HPI   Snoring / Suspected Sleep Apnea - Reports chronic problem now worsening in recent few weeks to months, with snoring at night wake up sometimes and reportedly stopped breathing. Admits fatigue and tired dozing off at times during day, interested in sleep study.  Epworth Sleepiness Scale Total Score: 12 Sitting and reading - 2 Watching TV - 0 Sitting inactive in a public place - 3 As a passenger in a car for an hour without a break - 2 Lying down to rest in the afternoon when circumstances permit - 3 Sitting and talking to someone - 0 Sitting quietly after a lunch without alcohol - 2 In a car, while stopped for a few minutes in traffic - 0  STOP-Bang OSA scoring Snoring yes   Tiredness yes   Observed apneas yes   Pressure HTN no   BMI > 35 kg/m2 yes   Age > 52  yes   Neck (female >17 in; Female >16 in)  no 20"  Gender female no   OSA risk low (0-2)  OSA risk intermediate (3-4)  OSA risk high (5+)  Total: 5 high risk     History of MI STEMI / HTN Followed by [facility]. They discontinued her from Atorvastatin, now still only on Aspirin 81, off other meds BB and ACEi - Currently doing well without cardiac complaints. Her edema is somewhat improved now. - Lifestyle - Diet: Now working on the [employer], also low salt  PTSD / Chronic Recurrent Depression with Anxiety Previously Followed by [facility] Dr Miles Costain, now she states she decides against going back due to poor success with group therapy and she would like to only do medication management, requesting if we can provider these rx for now. She may reconsider other psych. Continues Celexa 40mg  daily, Abilify 10mg  daily Takes OTC Sleep Aid, difficulty falling asleep Anxiety  seems to be seasonal. While on Summer break she is less anxious and does better generally.  Obesity BMI >36 Abnormal wt gain more recent >6 months, attributed to some medication.  Nicotine Dependence Still on 2-4 cigarettes daily - Prior trial on wellbutrin quit for 3 months in past  Chronic Cough / Environmental Allergies Using albuterol, episodic in winter, few days then stop for while, 2-3 days a week max Reports several triggers such as change in air temperature, mold, allergies. No formal dx Asthma or COPD. She has a chronic recurrent cough with episodes. Due for refill Albuterol.    Depression screen [facility] 2/9 02/15/2019 07/18/2018 03/21/2018  Decreased Interest 1 1 1   Down, Depressed, Hopeless 0 1 1  PHQ - 2 Score 1 2 2   Altered sleeping 2 3 0  Tired, decreased energy 2 3 3   Change in appetite 0 1 0  Feeling bad or failure about yourself  0 0 0  Trouble concentrating 2 1 1   Moving slowly or fidgety/restless 0 0 0  Suicidal thoughts 0 0 0  PHQ-9 Score 7 10 6   Difficult doing work/chores Somewhat difficult Not difficult at all Somewhat difficult   GAD 7 : Generalized Anxiety Score 02/15/2019 07/18/2018  Nervous, Anxious, on Edge 0 0  Control/stop worrying 1 3  Worry too much - different things 0 3  Trouble relaxing  1 0  Restless 0 0  Easily annoyed or irritable 1 0  Afraid - awful might happen 0 1  Total GAD 7 Score 3 7  Anxiety Difficulty Not difficult at all Not difficult at all    Social History   Tobacco Use  . Smoking status: Current Every Day Smoker    Packs/day: 0.25    Types: Cigarettes  . Smokeless tobacco: Current User  . Tobacco comment: down to 4 cigs per day  Substance Use Topics  . Alcohol use: Never    Alcohol/week: 0.0 standard drinks    Frequency: Never    Comment: 4 yrs sober  . Drug use: Not Currently    Types: Marijuana    Comment: Smoked once early today.    Review of Systems Per HPI unless specifically indicated above     Objective:     BP 119/69   Pulse 72   Temp 97.9 F (36.6 C) (Oral)   Resp 16   Ht 5\' 2"  (1.575 m)   Wt 200 lb (90.7 kg)   BMI 36.58 kg/m   Wt Readings from Last 3 Encounters:  02/15/19 200 lb (90.7 kg)  11/10/18 197 lb 12.8 oz (89.7 kg)  10/17/18 195 lb (88.5 kg)    Physical Exam Vitals signs and nursing note reviewed.  Constitutional:      General: She is not in acute distress.    Appearance: She is well-developed. She is not diaphoretic.     Comments: Well-appearing, comfortable, cooperative  HENT:     Head: Normocephalic and atraumatic.  Eyes:     General:        Right eye: No discharge.        Left eye: No discharge.     Conjunctiva/sclera: Conjunctivae normal.  Neck:     Musculoskeletal: Normal range of motion and neck supple.     Thyroid: No thyromegaly.  Cardiovascular:     Rate and Rhythm: Normal rate and regular rhythm.     Heart sounds: Normal heart sounds. No murmur.  Pulmonary:     Effort: Pulmonary effort is normal. No respiratory distress.     Breath sounds: Normal breath sounds. No wheezing or rales.  Musculoskeletal: Normal range of motion.  Lymphadenopathy:     Cervical: No cervical adenopathy.  Skin:    General: Skin is warm and dry.     Findings: No erythema or rash.  Neurological:     Mental Status: She is alert and oriented to person, place, and time.  Psychiatric:        Behavior: Behavior normal.     Comments: Well groomed, good eye contact, normal speech and thoughts        Assessment & Plan:   Problem List Items Addressed This Visit    Chronic cough    Secondary to environmental allergies and smoking history No dx of Asthma or COPD Will refill Albuterol for PRN Future follow-up if not improving may need Pulm / PFTs      Relevant Medications   albuterol (PROVENTIL HFA;VENTOLIN HFA) 108 (90 Base) MCG/ACT inhaler   Depression with anxiety    See A&P PTSD      History of ST elevation myocardial infarction (STEMI)    Currenstly stable and  asymptomatic S/p STEMI with PCI without stent in 09/2017 Followed by Dr [name] [facility] On med management currently now ASA only      Obesity (BMI 35.0-39.9 without comorbidity)    Discussed concern wt gain on  Abilify Improve diet lifestyle Recommend future consider taper down, as discussed      PTSD (post-traumatic stress disorder) - Primary    Relatively stable chronic problem with mixed mood depression PTSD Previously followed by [facility] Psych Dr Miles Costain  Plan Agree to transfer her mental health medications to our office, and manage these as her PCP for short term, discussed my concern on abilify can increase weight gain and may be possible problem, ultimately she is on for 6 months doing well, advised we can continue for 6 more months and then re-evaluate may future taper, otherwise we can arrange other psych refer or therapist if needed - explained that I typically do not do long term management on these meds for more complex patients with PTSD if she needs additional mental health support - Refill both meds abilify celexa today      Suspected sleep apnea    Persistent clinical concern for suspected obstructive sleep apnea given reported symptoms with witnessed apnea, snoring and sleep disturbance, fatigue excessive sleepiness. - Screening: ESS score 12 / STOP-Bang Score 5 - Neck Circumference: 15" normal - Co-morbidities: Mood Disorder  Plan: 1. Discussion on initial diagnosis and testing for OSA, risk factors, management, complications 2. Agree to proceed with sleep study testing based on clinical concerns - referral faxed to [facility] ordered this encounter  Medications  . albuterol (PROVENTIL HFA;VENTOLIN HFA) 108 (90 Base) MCG/ACT inhaler    Sig: Inhale 2 puffs into the lungs every 6 (six) hours as needed for wheezing or shortness of breath.    Dispense:  1 Inhaler    Refill:  5    Follow up plan: Return in about 5 months (around  07/16/2019) for Annual Physical.  Future labs ordered for 07/14/19  Nobie Putnam, [facility] 02/15/2019, 4:27 PM

## 2019-02-15 NOTE — Patient Instructions (Addendum)
Thank you for coming to the office today.  We have referred you to [facility] for a sleep study - they will contact you further about sleep lab test or a home kit to do sleep study  Stay tuned, call them if not heard back.  Continue Abilify and Celexa - I would agree to rx Abilify for up to 6-12 months for now, may need to consider a new Psychiatry in future, or let me know if need different therapist locally.  May plan to taper off Abilify after next visit, concern weight gain on medication.  Try to keep improving lifestyle diet / exercise as you are to help maintain weight.  Try to taper down and quit smoking - in future we can consider Wellbutrin  DUE for FASTING BLOOD WORK (no food or drink after midnight before the lab appointment, only water or coffee without cream/sugar on the morning of)  SCHEDULE "Lab Only" visit in the morning at the clinic for lab draw in 5 MONTHS   - Make sure Lab Only appointment is at about 1 week before your next appointment, so that results will be available  For Lab Results, once available within 2-3 days of blood draw, you can can log in to MyChart online to view your results and a brief explanation. Also, we can discuss results at next follow-up visit.   Please schedule a Follow-up Appointment to: Return in about 5 months (around 07/16/2019) for Annual Physical.  If you have any other questions or concerns, please feel free to call the office or send a message through [place]. You may also schedule an earlier appointment if necessary.  Additionally, you may be receiving a survey about your experience at our office within a few days to 1 week by e-mail or mail. We value your feedback.  Nobie Putnam, DO [place]

## 2019-02-16 ENCOUNTER — Encounter: Payer: Self-pay | Admitting: Family Medicine

## 2019-02-16 ENCOUNTER — Other Ambulatory Visit: Payer: Self-pay | Admitting: Family Medicine

## 2019-02-16 DIAGNOSIS — E669 Obesity, unspecified: Secondary | ICD-10-CM

## 2019-02-16 DIAGNOSIS — I25119 Atherosclerotic heart disease of native coronary artery with unspecified angina pectoris: Secondary | ICD-10-CM

## 2019-02-16 DIAGNOSIS — E782 Mixed hyperlipidemia: Secondary | ICD-10-CM

## 2019-02-16 DIAGNOSIS — Z0000 Encounter for general adult medical examination without abnormal findings: Secondary | ICD-10-CM

## 2019-02-16 DIAGNOSIS — F4310 Post-traumatic stress disorder, unspecified: Secondary | ICD-10-CM

## 2019-02-16 DIAGNOSIS — E559 Vitamin D deficiency, unspecified: Secondary | ICD-10-CM

## 2019-02-16 NOTE — Assessment & Plan Note (Signed)
Persistent clinical concern for suspected obstructive sleep apnea given reported symptoms with witnessed apnea, snoring and sleep disturbance, fatigue excessive sleepiness. - Screening: ESS score 12 / STOP-Bang Score 5 - Neck Circumference: 15" normal - Co-morbidities: Mood Disorder  Plan: 1. Discussion on initial diagnosis and testing for OSA, risk factors, management, complications 2. Agree to proceed with sleep study testing based on clinical concerns - referral faxed to [facility]

## 2019-02-16 NOTE — Assessment & Plan Note (Signed)
Discussed concern wt gain on Abilify Improve diet lifestyle Recommend future consider taper down, as discussed

## 2019-02-16 NOTE — Assessment & Plan Note (Signed)
Relatively stable chronic problem with mixed mood depression PTSD Previously followed by [facility] Psych Dr Miles Costain  Plan Agree to transfer her mental health medications to our office, and manage these as her PCP for short term, discussed my concern on abilify can increase weight gain and may be possible problem, ultimately she is on for 6 months doing well, advised we can continue for 6 more months and then re-evaluate may future taper, otherwise we can arrange other psych refer or therapist if needed - explained that I typically do not do long term management on these meds for more complex patients with PTSD if she needs additional mental health support - Refill both meds abilify celexa today

## 2019-02-16 NOTE — Assessment & Plan Note (Signed)
Currenstly stable and asymptomatic S/p STEMI with PCI without stent in 09/2017 Followed by Dr [name] [facility] On med management currently now ASA only

## 2019-02-16 NOTE — Assessment & Plan Note (Signed)
Secondary to environmental allergies and smoking history No dx of Asthma or COPD Will refill Albuterol for PRN Future follow-up if not improving may need Pulm / PFTs

## 2019-02-16 NOTE — Assessment & Plan Note (Signed)
See A&P PTSD

## 2019-04-03 ENCOUNTER — Other Ambulatory Visit: Payer: Self-pay

## 2019-04-03 ENCOUNTER — Encounter: Payer: Self-pay | Admitting: Family Medicine

## 2019-04-03 ENCOUNTER — Ambulatory Visit (INDEPENDENT_AMBULATORY_CARE_PROVIDER_SITE_OTHER): Payer: BC Managed Care – PPO | Admitting: Family Medicine

## 2019-04-03 DIAGNOSIS — R9431 Abnormal electrocardiogram [ECG] [EKG]: Secondary | ICD-10-CM | POA: Insufficient documentation

## 2019-04-03 DIAGNOSIS — M47816 Spondylosis without myelopathy or radiculopathy, lumbar region: Secondary | ICD-10-CM

## 2019-04-03 DIAGNOSIS — M5442 Lumbago with sciatica, left side: Secondary | ICD-10-CM | POA: Diagnosis not present

## 2019-04-03 MED ORDER — BACLOFEN 10 MG PO TABS: 5.0000 mg | ORAL_TABLET | Freq: Three times a day (TID) | ORAL | 1 refills | Status: DC | PRN

## 2019-04-03 MED ORDER — PREDNISONE 20 MG PO TABS: ORAL_TABLET | ORAL | 0 refills | Status: DC

## 2019-04-03 NOTE — Patient Instructions (Addendum)
1. For your Back Pain - I think that this is due to Muscle Spasms or strain. Your Sciatic Nerve can be affected causing some of your radiation and numbness down your legs. 2. Start Prednisone anti inflammatory taper - HOLD naproxen for now - 7 days - AFTER prednisone RESTART Naproxen 220mg  x 2 per dose twice daily with food for 2-4 weeks as needed 3. Start Baclofen (Lioresal) 10mg  tablets - cut in half for 5mg  at night for muscle relaxant - may make you sedated or sleepy (be careful driving or working on this) if tolerated you can take every 8 hours, half or whole tab 4. May use Tylenol Extra Str 500mg  tabs - may take 1-2 tablets every 6 hours as needed 5. Recommend to start using heating pad on your lower back 1-2x daily for few weeks  This pain may take weeks to months to fully resolve, but hopefully it will respond to the medicine initially. All back injuries (small or serious) are slow to heal since we use our back muscles every day. Be careful with turning, twisting, lifting, sitting / standing for prolonged periods, and avoid re-injury.  If your symptoms significantly worsen with more pain, or new symptoms with weakness in one or both legs, new or different shooting leg pains, numbness in legs or groin, loss of control or retention of urine or bowel movements, please call back for advice and you may need to go directly to the Emergency Department.   Please schedule a Follow-up Appointment to: Return in about 2 weeks (around 04/17/2019), or if symptoms worsen or fail to improve, for back pain.  If you have any other questions or concerns, please feel free to call the office or send a message through [place]. You may also schedule an earlier appointment if necessary.  Additionally, you may be receiving a survey about your experience at our office within a few days to 1 week by e-mail or mail. We value your feedback.  Nobie Putnam, DO [place]

## 2019-04-03 NOTE — Progress Notes (Signed)
Virtual Visit via Telephone The purpose of this virtual visit is to provide medical care while limiting exposure to the novel coronavirus (COVID19) for both patient and office staff.  Consent was obtained for phone visit:  Yes.   Answered questions that patient had about telehealth interaction:  Yes.   I discussed the limitations, risks, security and privacy concerns of performing an evaluation and management service by telephone. I also discussed with the patient that there may be a patient responsible charge related to this service. The patient expressed understanding and agreed to proceed.  Patient Location: Home Provider Location: [facility])  ---------------------------------------------------------------------- Chief Complaint  Patient presents with  . Hip Pain    onset 3 weeks hurts to sit and numbness in Hip area, heat and icing not helping radiate down to knee Left side could be sciatica as per patient    S: Reviewed CMA telephone note below. I have called patient and gathered additional HPI as follows:  ACUTE LOW BACK HIP PAIN LEFT, with SCIATICA Reports that symptoms started about 3 weeks ago with Left Hip pain, thinks she "pulled a muscle" but it did not resolve, now she is doing a lot more sitting due to current change of work environment now at home, she is more sedentary. Worse with prolonged sitting, some radiation of pain feels like similar sciatica radiating across back and down partway of left leg on back side of leg - May have episodes of 0 pain and controlled symptoms but then can flare up again worse with prolong sitting, when has flare up usually pain is moderate 6-7 out of 10, with intermittent worsening - Taking Naproxen 220mg  x 3 - BID with some moderate relief, makes it more manageable, Not taking Tylenol - Tried heating pad at night, ice, muscle - History of lumbar OA/DJD on prior X-ray 2017. No known back surgery - Prior similar back pain  flares improved with prednisone, baclofen, in 2017 - Denies any fevers/chills, numbness, tingling, weakness, loss of control bladder/bowel incontinence or retention, unintentional wt loss, night sweats  Patient is currently working from home in isolation - works as Pharmacist, hospital Denies any high risk travel to areas of current concern for [place]. Denies any known or suspected exposure to person with or possibly with COVID19.  Denies any fevers, chills, sweats, body ache, cough, shortness of breath  Past Medical History:  Diagnosis Date  . ASD (atrial septal defect), common atrium (single atrium)   . 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 Heart attack ([place]) 10/26/2017  . Heart murmur   . Kidney stone    Social History   Tobacco Use  . Smoking status: Current Every Day Smoker    Packs/day: 0.25    Types: Cigarettes  . Smokeless tobacco: Current User  . Tobacco comment: down to 4 cigs per day  Substance Use Topics  . Alcohol use: Never    Alcohol/week: 0.0 standard drinks    Frequency: Never    Comment: 4 yrs sober  . Drug use: Not Currently    Types: Marijuana    Comment: Smoked once early today.    Current Outpatient Medications:  .  albuterol (PROVENTIL HFA;VENTOLIN HFA) 108 (90 Base) MCG/ACT inhaler, Inhale 2 puffs into the lungs every 6 (six) hours as needed for wheezing or shortness of breath., Disp: 1 Inhaler, Rfl: 5 .  ARIPiprazole (ABILIFY) 10 MG tablet, Take 10 mg by mouth daily., Disp: , Rfl:  .  aspirin 81 MG chewable tablet, Chew 1 tablet (81 mg total) by mouth daily., Disp: 30 tablet, Rfl: 1 .  cetirizine (ZYRTEC) 10 MG tablet, Take 10 mg by mouth daily. , Disp: , Rfl:  .  citalopram (CELEXA) 40 MG tablet, , Disp: , Rfl:  .  diphenhydrAMINE (BENADRYL) 25 MG tablet, Take 25 mg by mouth every 4 (four) hours as needed., Disp: , Rfl:  .  ENSTILAR 0.005-0.064 % FOAM, APPLY TOPICALLY TO THE AFFECTED AREA EVERY DAY, Disp: , Rfl: 0 .  EUCRISA 2 % OINT, APPLY  TOPICALLY TO THE AFFECTED AREA EVERY NIGHT AT BEDTIME, Disp: , Rfl: 0 .  hydrochlorothiazide (HYDRODIURIL) 12.5 MG tablet, Take by mouth., Disp: , Rfl:  .  baclofen (LIORESAL) 10 MG tablet, Take 0.5-1 tablets (5-10 mg total) by mouth 3 (three) times daily as needed for muscle spasms., Disp: 30 each, Rfl: 1 .  predniSONE (DELTASONE) 20 MG tablet, Take daily with food. Start with 60mg  (3 pills) x 2 days, then reduce to 40mg  (2 pills) x 2 days, then 20mg  (1 pill) x 3 days, Disp: 13 tablet, Rfl: 0  Depression screen [facility] 2/9 02/15/2019 07/18/2018 03/21/2018  Decreased Interest 1 1 1   Down, Depressed, Hopeless 0 1 1  PHQ - 2 Score 1 2 2   Altered sleeping 2 3 0  Tired, decreased energy 2 3 3   Change in appetite 0 1 0  Feeling bad or failure about yourself  0 0 0  Trouble concentrating 2 1 1   Moving slowly or fidgety/restless 0 0 0  Suicidal thoughts 0 0 0  PHQ-9 Score 7 10 6   Difficult doing work/chores Somewhat difficult Not difficult at all Somewhat difficult    GAD 7 : Generalized Anxiety Score 04/03/2019 02/15/2019 07/18/2018  Nervous, Anxious, on Edge (No Data) 0 0  Control/stop worrying - 1 3  Worry too much - different things - 0 3  Trouble relaxing - 1 0  Restless - 0 0  Easily annoyed or irritable - 1 0  Afraid - awful might happen - 0 1  Total GAD 7 Score - 3 7  Anxiety Difficulty - Not difficult at all Not difficult at all    -------------------------------------------------------------------------- O: No physical exam performed due to remote telephone encounter.  Lab results reviewed.  No results found for this or any previous visit (from the past 2160 hour(s)).  -------------------------------------------------------------------------- A&P:  Problem List Items Addressed This Visit    None    Visit Diagnoses    Acute left-sided back pain with sciatica    -  Primary   Relevant Medications   predniSONE (DELTASONE) 20 MG tablet   baclofen (LIORESAL) 10 MG tablet    Spondylosis of lumbar region without myelopathy or radiculopathy       Relevant Medications   predniSONE (DELTASONE) 20 MG tablet   baclofen (LIORESAL) 10 MG tablet     Acute on chronic Left LBP vs Hip pain with sciatica associated. Suspect likely due to muscle spasm/strain with recent work changes now more sedentary may have muscle strain from sedentary changes In setting of known chronic LBP several years, last flare / imaging 2017 - No red flag symptoms  Plan: 1. Start prednisone burst with 7 day taper 60 x 2 days > 40 x 2 days > 20 x 3 days 2. Hold naproxen while on prednisone, then resume after 1 week, then resume OTC naproxen 220mg  x 2 per dose BID x 1-2 weeks then PRN 3. Trial again  on - Start muscle relaxant with Baclofen 10mg  tabs - take 5-10mg  up to TID PRN, titrate up as tolerated 4. May use Tylenol PRN for breakthrough 5. Encouraged use of heating pad 1-2x daily for now then PRN 6. Follow-up as needed 2-4 weeks - may consider PT vs Ortho if not improve - otherwise can switch meds including add Gabapentin or other muscle relaxant  Meds ordered this encounter  Medications  . predniSONE (DELTASONE) 20 MG tablet    Sig: Take daily with food. Start with 60mg  (3 pills) x 2 days, then reduce to 40mg  (2 pills) x 2 days, then 20mg  (1 pill) x 3 days    Dispense:  13 tablet    Refill:  0  . baclofen (LIORESAL) 10 MG tablet    Sig: Take 0.5-1 tablets (5-10 mg total) by mouth 3 (three) times daily as needed for muscle spasms.    Dispense:  30 each    Refill:  1    Follow-up: - Return in 2-4 weeks as needed back pain  Patient verbalizes understanding with the above medical recommendations including the limitation of remote medical advice.  Specific follow-up and call-back criteria were given for patient to follow-up or seek medical care more urgently if needed.   - Time spent in direct consultation with patient on phone: 11 minutes  Nobie Putnam, [facility] 04/03/2019, 10:25 AM

## 2019-04-05 ENCOUNTER — Encounter: Payer: Self-pay | Admitting: Family Medicine

## 2019-06-15 ENCOUNTER — Other Ambulatory Visit: Payer: Self-pay | Admitting: Family Medicine

## 2019-06-15 DIAGNOSIS — Z1231 Encounter for screening mammogram for malignant neoplasm of breast: Secondary | ICD-10-CM

## 2019-07-14 ENCOUNTER — Other Ambulatory Visit: Payer: BC Managed Care – PPO

## 2019-07-14 ENCOUNTER — Other Ambulatory Visit: Payer: Self-pay

## 2019-07-14 DIAGNOSIS — E559 Vitamin D deficiency, unspecified: Secondary | ICD-10-CM

## 2019-07-14 DIAGNOSIS — F4310 Post-traumatic stress disorder, unspecified: Secondary | ICD-10-CM

## 2019-07-14 DIAGNOSIS — E782 Mixed hyperlipidemia: Secondary | ICD-10-CM

## 2019-07-14 DIAGNOSIS — Z0000 Encounter for general adult medical examination without abnormal findings: Secondary | ICD-10-CM

## 2019-07-14 DIAGNOSIS — I25119 Atherosclerotic heart disease of native coronary artery with unspecified angina pectoris: Secondary | ICD-10-CM

## 2019-07-15 LAB — TSH: TSH: 3.98 mIU/L

## 2019-07-15 LAB — COMPLETE METABOLIC PANEL WITH GFR
AG Ratio: 1.4 (calc) (ref 1.0–2.5)
ALT: 13 U/L (ref 6–29)
AST: 14 U/L (ref 10–35)
Albumin: 3.8 g/dL (ref 3.6–5.1)
Alkaline phosphatase (APISO): 89 U/L (ref 37–153)
BUN: 13 mg/dL (ref 7–25)
CO2: 27 mmol/L (ref 20–32)
Calcium: 9.5 mg/dL (ref 8.6–10.4)
Chloride: 104 mmol/L (ref 98–110)
Creat: 0.66 mg/dL (ref 0.50–1.05)
GFR, Est African American: 118 mL/min/{1.73_m2} (ref >=60)
GFR, Est Non African American: 102 mL/min/{1.73_m2} (ref >=60)
Globulin: 2.7 g/dL (calc) (ref 1.9–3.7)
Glucose, Bld: 83 mg/dL (ref 65–139)
Potassium: 4.7 mmol/L (ref 3.5–5.3)
Sodium: 138 mmol/L (ref 135–146)
Total Bilirubin: 0.4 mg/dL (ref 0.2–1.2)
Total Protein: 6.5 g/dL (ref 6.1–8.1)

## 2019-07-15 LAB — CBC WITH DIFFERENTIAL/PLATELET
Absolute Monocytes: 605 cells/uL (ref 200–950)
Basophils Absolute: 27 cells/uL (ref 0–200)
Basophils Relative: 0.4 %
Eosinophils Absolute: 163 cells/uL (ref 15–500)
Eosinophils Relative: 2.4 %
HCT: 42.5 % (ref 35.0–45.0)
Hemoglobin: 13.7 g/dL (ref 11.7–15.5)
Lymphs Abs: 2292 cells/uL (ref 850–3900)
MCH: 30.2 pg (ref 27.0–33.0)
MCHC: 32.2 g/dL (ref 32.0–36.0)
MCV: 93.8 fL (ref 80.0–100.0)
MPV: 9.6 fL (ref 7.5–12.5)
Monocytes Relative: 8.9 %
Neutro Abs: 3713 cells/uL (ref 1500–7800)
Neutrophils Relative %: 54.6 %
Platelets: 496 10*3/uL — ABNORMAL HIGH (ref 140–400)
RBC: 4.53 10*6/uL (ref 3.80–5.10)
RDW: 12.5 % (ref 11.0–15.0)
Total Lymphocyte: 33.7 %
WBC: 6.8 10*3/uL (ref 3.8–10.8)

## 2019-07-15 LAB — LIPID PANEL
Cholesterol: 193 mg/dL (ref <200)
HDL: 50 mg/dL (ref >=50)
LDL Cholesterol (Calc): 112 mg/dL (calc) — ABNORMAL HIGH
Non-HDL Cholesterol (Calc): 143 mg/dL (calc) — ABNORMAL HIGH (ref <130)
Total CHOL/HDL Ratio: 3.9 (calc) (ref <5.0)
Triglycerides: 192 mg/dL — ABNORMAL HIGH (ref <150)

## 2019-07-15 LAB — HEMOGLOBIN A1C
Hgb A1c MFr Bld: 5.3 % of total Hgb (ref <5.7)
Mean Plasma Glucose: 105 (calc)
eAG (mmol/L): 5.8 (calc)

## 2019-07-15 LAB — VITAMIN D 25 HYDROXY (VIT D DEFICIENCY, FRACTURES): Vit D, 25-Hydroxy: 22 ng/mL — ABNORMAL LOW (ref 30–100)

## 2019-07-17 ENCOUNTER — Encounter: Payer: Self-pay | Admitting: Family Medicine

## 2019-07-17 DIAGNOSIS — J432 Centrilobular emphysema: Secondary | ICD-10-CM | POA: Insufficient documentation

## 2019-07-21 ENCOUNTER — Ambulatory Visit (INDEPENDENT_AMBULATORY_CARE_PROVIDER_SITE_OTHER): Payer: BC Managed Care – PPO | Admitting: Family Medicine

## 2019-07-21 ENCOUNTER — Other Ambulatory Visit: Payer: Self-pay | Admitting: Family Medicine

## 2019-07-21 ENCOUNTER — Encounter: Payer: Self-pay | Admitting: Family Medicine

## 2019-07-21 ENCOUNTER — Other Ambulatory Visit: Payer: Self-pay

## 2019-07-21 VITALS — BP 102/55 | HR 69 | Temp 98.5°F | Resp 16 | Ht 62.0 in | Wt 206.0 lb

## 2019-07-21 DIAGNOSIS — I25119 Atherosclerotic heart disease of native coronary artery with unspecified angina pectoris: Secondary | ICD-10-CM

## 2019-07-21 DIAGNOSIS — K219 Gastro-esophageal reflux disease without esophagitis: Secondary | ICD-10-CM

## 2019-07-21 DIAGNOSIS — J432 Centrilobular emphysema: Secondary | ICD-10-CM

## 2019-07-21 DIAGNOSIS — F4310 Post-traumatic stress disorder, unspecified: Secondary | ICD-10-CM

## 2019-07-21 DIAGNOSIS — E669 Obesity, unspecified: Secondary | ICD-10-CM | POA: Diagnosis not present

## 2019-07-21 DIAGNOSIS — G4733 Obstructive sleep apnea (adult) (pediatric): Secondary | ICD-10-CM

## 2019-07-21 DIAGNOSIS — E782 Mixed hyperlipidemia: Secondary | ICD-10-CM | POA: Diagnosis not present

## 2019-07-21 DIAGNOSIS — Z0000 Encounter for general adult medical examination without abnormal findings: Secondary | ICD-10-CM

## 2019-07-21 DIAGNOSIS — F17210 Nicotine dependence, cigarettes, uncomplicated: Secondary | ICD-10-CM

## 2019-07-21 MED ORDER — CITALOPRAM HYDROBROMIDE 40 MG PO TABS: 40.0000 mg | ORAL_TABLET | Freq: Every day | ORAL | 2 refills | Status: DC

## 2019-07-21 MED ORDER — ARIPIPRAZOLE 10 MG PO TABS: 10.0000 mg | ORAL_TABLET | Freq: Every day | ORAL | 2 refills | Status: DC

## 2019-07-21 MED ORDER — PANTOPRAZOLE SODIUM 40 MG PO TBEC: 40.0000 mg | DELAYED_RELEASE_TABLET | Freq: Every day | ORAL | 2 refills | Status: DC

## 2019-07-21 NOTE — Progress Notes (Signed)
Subjective:    Patient ID: Angela Mccullough, female    DOB: 1966-02-03, 53 y.o.   MRN: [id number]  Angela Mccullough is a 53 y.o. female presenting on 07/21/2019 for No chief complaint on file.   HPI  Here for Annual Physical and Lab Review  OSA, untreated Previously evaluated and diagnosed by [place], reviewed past data PSG - 02/25/19 - predominantly REM dependent OSA, AHI 12.3, desat 86% - supine CPAP Titration 03/31/19 - significant improvement nasal CPAP - final pressure 7 cm H2O, ResMed N20 mask, size med, humidifier yes. - Signed faxed order 04/05/19 for CPAP therapy Update today - patient was not able to afford CPAP due to high cost $800, she is still off CPAP  HYPERLIPIDEMIA / Obesity BMI >37        History of MI STEMI / HTN Followed by [facility]. They discontinued her from Atorvastatin previously, now still only on Aspirin 81, off other meds BB and ACEi - Currently doing well without cardiac complaints. Her edema is somewhat improved now. Last lipid panel 06/2019, elevated LDL from previous, elevated TG - Currently taking cholesterol medicine at night, not sure which one, she needs to contact us with name, it is from Dr Stevphen [facility] Lifestyle - Weight gain 10 lbs up to 9 months - Diet: Improving water intake, reducing sodas, tea, tried mediterranean diet, still gaining weight - Exercise: Limited, some walking  GERD Uncontrolled by her report, on OTC Omeprazole. Still has refractory symptoms.  PTSD / Chronic Recurrent Depression with Anxiety Previously Followed by [facility] Miles Costain, out of meds for 1-2 weeks now, she stopped going due to group therapy not helping, She may reconsider other psych. Needs refill to ContinuesCelexa 40mg  daily, Abilify 10mg  daily Admits more irritable off medicine Takes OTC Sleep Aid, difficulty falling asleep Anxiety seems to be seasonal. While on Summer break she is less anxious and does better  generally. She is interested to taper on Abilify  Centrilobular Emphysema / Nicotine Dependence Active smoker. No flare up lately. On Albuterol PRN rarely. Not on maintenance   Health Maintenance:  Screening Mammogram scheduled for 07/24/19  UTD Pap smear last done 07/27/18  UTD Colonoscopy 10/17/18, Dr Marius Ditch [facility] - multiple polyps, repeat in 2 years, anticipate 2021  Depression screen [facility] 2/9 07/21/2019 02/15/2019 07/18/2018  Decreased Interest 3 1 1   Down, Depressed, Hopeless 2 0 1  PHQ - 2 Score 5 1 2   Altered sleeping 2 2 3   Tired, decreased energy 2 2 3   Change in appetite 1 0 1  Feeling bad or failure about yourself  0 0 0  Trouble concentrating 2 2 1   Moving slowly or fidgety/restless 0 0 0  Suicidal thoughts 0 0 0  PHQ-9 Score 12 7 10   Difficult doing work/chores Somewhat difficult Somewhat difficult Not difficult at all    GAD 7 : Generalized Anxiety Score 07/21/2019 04/03/2019 02/15/2019 07/18/2018  Nervous, Anxious, on Edge 3 (No Data) 0 0  Control/stop worrying 2 - 1 3  Worry too much - different things 2 - 0 3  Trouble relaxing 1 - 1 0  Restless 0 - 0 0  Easily annoyed or irritable 3 - 1 0  Afraid - awful might happen 1 - 0 1  Total GAD 7 Score 12 - 3 7  Anxiety Difficulty Somewhat difficult - Not difficult at all Not difficult at all    Past Medical History:  Diagnosis Date  . ASD (atrial  septal defect), common atrium (single atrium)   . 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 Heart attack ([place]) 10/26/2017  . Heart murmur   . Kidney stone    Past Surgical History:  Procedure Laterality Date  . atrium septal defect  1999  . COLONOSCOPY WITH PROPOFOL N/A 10/17/2018   Procedure: COLONOSCOPY WITH PROPOFOL;  Surgeon: Lin Landsman, MD;  Location: [facility] ENDOSCOPY;  Service: Gastroenterology;  Laterality: N/A;  . CORONARY ANGIOPLASTY     09/2017  . CORONARY/GRAFT ACUTE MI REVASCULARIZATION N/A 10/26/2017   Procedure: Coronary/Graft Acute MI  Revascularization;  Surgeon: Nelva Bush, MD;  Location: [place] CV LAB;  Service: Cardiovascular;  Laterality: N/A;  . ESOPHAGOGASTRODUODENOSCOPY (EGD) WITH PROPOFOL N/A 10/17/2018   Procedure: ESOPHAGOGASTRODUODENOSCOPY (EGD) WITH PROPOFOL;  Surgeon: Lin Landsman, MD;  Location: [facility] ENDOSCOPY;  Service: Gastroenterology;  Laterality: N/A;  . LEFT HEART CATH AND CORONARY ANGIOGRAPHY N/A 10/26/2017   Procedure: LEFT HEART CATH AND CORONARY ANGIOGRAPHY;  Surgeon: Nelva Bush, MD;  Location: [place] CV LAB;  Service: Cardiovascular;  Laterality: N/A;  . WISDOM TOOTH EXTRACTION     age 53 all four   Social History   Socioeconomic History  . Marital status: Single    Spouse name: Not on file  . Number of children: 2  . Years of education: 52  . Highest education level: Not on file  Occupational History  . 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 Financial resource strain: Not on file  . Food insecurity    Worry: Not on file    Inability: Not on file  . Transportation needs    Medical: Not on file    Non-medical: Not on file  Tobacco Use  . Smoking status: Current Every Day Smoker    Packs/day: 0.25    Types: Cigarettes  . Smokeless tobacco: Current User  . Tobacco comment: down to 4 cigs per day  Substance and Sexual Activity  . Alcohol use: Never    Alcohol/week: 0.0 standard drinks    Frequency: Never    Comment: 4 yrs sober  . Drug use: Not Currently    Types: Marijuana    Comment: Smoked once early today.  Marland Kitchen Sexual activity: Yes    Birth control/protection: Post-menopausal  Lifestyle  . Physical activity    Days per week: Not on file    Minutes per session: Not on file  . Stress: Not on file  Relationships  . Social Herbalist on phone: Not on file    Gets together: Not on file    Attends religious service: Not on file    Active member of club or organization: Not on file    Attends meetings of  clubs or organizations: Not on file    Relationship status: Not on file  . Intimate partner violence    Fear of current or ex partner: Not on file    Emotionally abused: Not on file    Physically abused: Not on file    Forced sexual activity: Not on file  Other Topics Concern  . Not on file  Social History Narrative  . Not on file   Family History  Problem Relation Age of Onset  . Diabetes Mother        TYPE 1  . COPD Mother   . Kidney Stones Mother   . Coronary artery disease Mother   . Heart failure  Mother   . Kidney Stones Father   . Coronary artery disease Father   . Colon cancer Father 89       Stage IV  . Hashimoto's thyroiditis Sister   . Diabetes Maternal Grandmother        TYPE2  . Diabetes Maternal Grandfather        TYPE 2  . Kidney disease Neg Hx   . Bladder Cancer Neg Hx   . Breast cancer Neg Hx   . Cervical cancer Neg Hx    Current Outpatient Medications on File Prior to Visit  Medication Sig  . albuterol (PROVENTIL HFA;VENTOLIN HFA) 108 (90 Base) MCG/ACT inhaler Inhale 2 puffs into the lungs every 6 (six) hours as needed for wheezing or shortness of breath.  Marland Kitchen aspirin 81 MG chewable tablet Chew 1 tablet (81 mg total) by mouth daily.  . cetirizine (ZYRTEC) 10 MG tablet Take 10 mg by mouth daily.   . diphenhydrAMINE (BENADRYL) 25 MG tablet Take 25 mg by mouth every 4 (four) hours as needed.  . ENSTILAR 0.005-0.064 % FOAM APPLY TOPICALLY TO THE AFFECTED AREA EVERY DAY  . EUCRISA 2 % OINT APPLY TOPICALLY TO THE AFFECTED AREA EVERY NIGHT AT BEDTIME   No current facility-administered medications on file prior to visit.     Review of Systems  Constitutional: Negative for activity change, appetite change, chills, diaphoresis, fatigue and fever.  HENT: Negative for congestion and hearing loss.   Eyes: Negative for visual disturbance.  Respiratory: Negative for cough, chest tightness, shortness of breath and wheezing.   Cardiovascular: Negative for chest pain,  palpitations and leg swelling.  Gastrointestinal: Negative for abdominal pain, anal bleeding, blood in stool, constipation, diarrhea, nausea and vomiting.  Endocrine: Negative for cold intolerance.  Genitourinary: Negative for difficulty urinating, dysuria, frequency and hematuria.  Musculoskeletal: Negative for arthralgias, back pain and neck pain.  Skin: Negative for rash.  Allergic/Immunologic: Negative for environmental allergies.  Neurological: Negative for dizziness, weakness, light-headedness, numbness and headaches.  Hematological: Negative for adenopathy.  Psychiatric/Behavioral: Negative for behavioral problems, dysphoric mood and sleep disturbance. The patient is nervous/anxious.        Irritability   Per HPI unless specifically indicated above     Objective:    BP (!) 102/55   Pulse 69   Temp 98.5 F (36.9 C) (Oral)   Resp 16   Ht 5\' 2"  (1.575 m)   Wt 206 lb (93.4 kg)   BMI 37.68 kg/m   Wt Readings from Last 3 Encounters:  07/21/19 206 lb (93.4 kg)  02/15/19 200 lb (90.7 kg)  11/10/18 197 lb 12.8 oz (89.7 kg)    Physical Exam Vitals signs and nursing note reviewed.  Constitutional:      General: She is not in acute distress.    Appearance: She is well-developed. She is not diaphoretic.     Comments: Well-appearing, comfortable, cooperative, obesity  HENT:     Head: Normocephalic and atraumatic.  Eyes:     General:        Right eye: No discharge.        Left eye: No discharge.     Conjunctiva/sclera: Conjunctivae normal.     Pupils: Pupils are equal, round, and reactive to light.  Neck:     Musculoskeletal: Normal range of motion and neck supple.     Thyroid: No thyromegaly.  Cardiovascular:     Rate and Rhythm: Normal rate and regular rhythm.     Heart sounds: Normal heart  sounds. No murmur.  Pulmonary:     Effort: Pulmonary effort is normal. No respiratory distress.     Breath sounds: Normal breath sounds. No wheezing or rales.  Abdominal:      General: Bowel sounds are normal. There is no distension.     Palpations: Abdomen is soft. There is no mass.     Tenderness: There is no abdominal tenderness.  Musculoskeletal: Normal range of motion.        General: No tenderness.     Comments: Upper / Lower Extremities: - Normal muscle tone, strength bilateral upper extremities 5/5, lower extremities 5/5  Lymphadenopathy:     Cervical: No cervical adenopathy.  Skin:    General: Skin is warm and dry.     Findings: No erythema or rash.  Neurological:     Mental Status: She is alert and oriented to person, place, and time.     Comments: Distal sensation intact to light touch all extremities  Psychiatric:        Behavior: Behavior normal.     Comments: Well groomed, good eye contact, normal speech and thoughts        Results for orders placed or performed in visit on 07/14/19  VITAMIN D 25 Hydroxy (Vit-D Deficiency, Fractures)  Result Value Ref Range   Vit D, 25-Hydroxy 22 (L) 30 - 100 ng/mL  TSH  Result Value Ref Range   TSH 3.98 mIU/L  Lipid panel  Result Value Ref Range   Cholesterol 193 <200 mg/dL   HDL 50 > OR = 50 mg/dL   Triglycerides 192 (H) <150 mg/dL   LDL Cholesterol (Calc) 112 (H) mg/dL (calc)   Total CHOL/HDL Ratio 3.9 <5.0 (calc)   Non-HDL Cholesterol (Calc) 143 (H) <130 mg/dL (calc)  COMPLETE METABOLIC PANEL WITH GFR  Result Value Ref Range   Glucose, Bld 83 65 - 139 mg/dL   BUN 13 7 - 25 mg/dL   Creat 0.66 0.50 - 1.05 mg/dL   GFR, Est Non African American 102 > OR = 60 mL/min/1.29m2   GFR, Est African American 118 > OR = 60 mL/min/1.8m2   BUN/Creatinine Ratio NOT APPLICABLE 6 - 22 (calc)   Sodium 138 135 - 146 mmol/L   Potassium 4.7 3.5 - 5.3 mmol/L   Chloride 104 98 - 110 mmol/L   CO2 27 20 - 32 mmol/L   Calcium 9.5 8.6 - 10.4 mg/dL   Total Protein 6.5 6.1 - 8.1 g/dL   Albumin 3.8 3.6 - 5.1 g/dL   Globulin 2.7 1.9 - 3.7 g/dL (calc)   AG Ratio 1.4 1.0 - 2.5 (calc)   Total Bilirubin 0.4 0.2 - 1.2  mg/dL   Alkaline phosphatase (APISO) 89 37 - 153 U/L   AST 14 10 - 35 U/L   ALT 13 6 - 29 U/L  CBC with Differential/Platelet  Result Value Ref Range   WBC 6.8 3.8 - 10.8 Thousand/uL   RBC 4.53 3.80 - 5.10 Million/uL   Hemoglobin 13.7 11.7 - 15.5 g/dL   HCT 42.5 35.0 - 45.0 %   MCV 93.8 80.0 - 100.0 fL   MCH 30.2 27.0 - 33.0 pg   MCHC 32.2 32.0 - 36.0 g/dL   RDW 12.5 11.0 - 15.0 %   Platelets 496 (H) 140 - 400 Thousand/uL   MPV 9.6 7.5 - 12.5 fL   Neutro Abs 3,713 1,500 - 7,800 cells/uL   Lymphs Abs 2,292 850 - 3,900 cells/uL   Absolute Monocytes 605 200 - 950  cells/uL   Eosinophils Absolute 163 15 - 500 cells/uL   Basophils Absolute 27 0 - 200 cells/uL   Neutrophils Relative % 54.6 %   Total Lymphocyte 33.7 %   Monocytes Relative 8.9 %   Eosinophils Relative 2.4 %   Basophils Relative 0.4 %  Hemoglobin A1c  Result Value Ref Range   Hgb A1c MFr Bld 5.3 <5.7 % of total Hgb   Mean Plasma Glucose 105 (calc)   eAG (mmol/L) 5.8 (calc)      Assessment & Plan:   Problem List Items Addressed This Visit    Centrilobular emphysema (HCC)    Stable, controlled, without flare up Rarely using Albuterol Smoking cessation Follow-up      Coronary artery disease involving native coronary artery of native heart with angina pectoris ([place])    Stable without angina Followed by [facility] Dr [name]      GERD    Refractory symptoms Trial on PPI Protonix 40mg  daily before 1st meal F/u      Relevant Medications   pantoprazole (PROTONIX) 40 MG tablet   Hyperlipidemia    Elevated LDL and TG, not at goal S/p  MI in 09/2017 Last lipid panel 6295 Complicated by CAD  Plan: 1. Continue current meds - cholesterol med per Dr [name] Cardiology - request name of med, not listed on his chart 2. Continue ASA 81mg  for secondary ASCVD risk reduction 3. Encourage improved lifestyle - low carb/cholesterol, reduce portion size, continue improving regular exercise      Nicotine  dependence    Smoking cessation      Obesity (BMI 35.0-39.9 without comorbidity)    Discussed concern wt gain on Abilify - now taper down as advised Improve diet lifestyle as instructed calorie count app, consider keto diet for short term only      OSA (obstructive sleep apnea)    Concern persistent uncontrolled OSA Financial barrier unable to purchase CPAP machine with some lack of coverage cost >$800 Advised her to try to reconsider this therapy in future May benefit wt loss and cardiovascular health      PTSD (post-traumatic stress disorder)    Relatively stable chronic problem with mixed mood depression PTSD Previously followed by [facility] Dr Miles Costain  Plan Agree to transfer her mental health medications to our office, and manage these as her PCP for short term, discussed my concern on abilify can increase weight gain and may be possible problem, ultimately she is on for about 3 more months with slow taper rx for Abilify - advised her she can locate other Psychiatry providers, handout given - explained that I typically do not do long term management on these meds for more complex patients with PTSD if she needs additional mental health support - Refill both meds abilify celexa today       Other Visit Diagnoses    Annual physical exam    -  Primary      Updated Health Maintenance information - Due mammogram this year, scheduled - Due colonoscopy next year per [facility], due to polyps - UTD Pap smear Reviewed recent lab results with patient Encouraged improvement to lifestyle with diet and exercise - Goal of weight loss   Meds ordered this encounter  Medications  . pantoprazole (PROTONIX) 40 MG tablet    Sig: Take 1 tablet (40 mg total) by mouth daily before breakfast.    Dispense:  30 tablet    Refill:  2     Follow up plan: Return in  about 3 months (around 10/21/2019) for 3 months PTSD med adjust, Weight.  Nobie Putnam, [facility] 07/21/2019, 9:10 AM

## 2019-07-21 NOTE — Assessment & Plan Note (Signed)
Refractory symptoms Trial on PPI Protonix 40mg  daily before 1st meal F/u

## 2019-07-21 NOTE — Assessment & Plan Note (Signed)
Relatively stable chronic problem with mixed mood depression PTSD Previously followed by [facility] Dr Miles Costain  Plan Agree to transfer her mental health medications to our office, and manage these as her PCP for short term, discussed my concern on abilify can increase weight gain and may be possible problem, ultimately she is on for about 3 more months with slow taper rx for Abilify - advised her she can locate other Psychiatry providers, handout given - explained that I typically do not do long term management on these meds for more complex patients with PTSD if she needs additional mental health support - Refill both meds abilify celexa today

## 2019-07-21 NOTE — Assessment & Plan Note (Signed)
Stable, controlled, without flare up Rarely using Albuterol Smoking cessation Follow-up

## 2019-07-21 NOTE — Assessment & Plan Note (Signed)
Smoking cessation  

## 2019-07-21 NOTE — Assessment & Plan Note (Signed)
Elevated LDL and TG, not at goal S/p  MI in 09/2017 Last lipid panel 2952 Complicated by CAD  Plan: 1. Continue current meds - cholesterol med per Dr [name] Cardiology - request name of med, not listed on his chart 2. Continue ASA 81mg  for secondary ASCVD risk reduction 3. Encourage improved lifestyle - low carb/cholesterol, reduce portion size, continue improving regular exercise

## 2019-07-21 NOTE — Assessment & Plan Note (Signed)
Discussed concern wt gain on Abilify - now taper down as advised Improve diet lifestyle as instructed calorie count app, consider keto diet for short term only

## 2019-07-21 NOTE — Assessment & Plan Note (Signed)
Concern persistent uncontrolled OSA Financial barrier unable to purchase CPAP machine with some lack of coverage cost >$800 Advised her to try to reconsider this therapy in future May benefit wt loss and cardiovascular health

## 2019-07-21 NOTE — Patient Instructions (Addendum)
Thank you for coming to the office today.  Mildly low Vitamin D, but it is optional to treat If interested for maintenance dose can try OTC Vitamin D3 2,000 iu daily  Refilled Celexa and Abilify for 3 months  If planning to taper off Abilify can taper slowly, usually 1 pill every other day, and on other day can take HALF pill, then eventually down to half pill daily, then half pill every other day.   [place] ([place] [facility]) Address: [address], [place], [place] [address] Hours: 8:30AM-5PM Phone: [phone]  External Referral  Cephus Shelling, MD  [address] [place], [place] [address] Phone: [phone] Fax: [phone]   Self Referral  [facility] (All ages) [address], [place], [id number] Phone: [phone] (Option 1) [email or web address], available walk-in 9am-4pm M-F [place], [place] [address] Hours: 9am - 4pm (M-F, walk in available) [phone]   ----------------------------------------------------------------- COUNSELING ONLY  Buena Irish, LCSW [address] [place], [place]: [phone]   Please schedule a Follow-up Appointment to: Return in about 3 months (around 10/21/2019) for 3 months PTSD med adjust, Weight.  If you have any other questions or concerns, please feel free to call the office or send a message through [place]. You may also schedule an earlier appointment if necessary.  Additionally, you may be receiving a survey about your experience at our office within a few days to 1 week by e-mail or mail. We value your feedback.  Nobie Putnam, DO [place]

## 2019-07-21 NOTE — Assessment & Plan Note (Signed)
Stable without angina Followed by [facility] Cardiology Dr [name]

## 2019-07-24 ENCOUNTER — Other Ambulatory Visit: Payer: Self-pay

## 2019-07-24 ENCOUNTER — Ambulatory Visit
Admission: RE | Admit: 2019-07-24 | Discharge: 2019-07-24 | Disposition: A | Discharge status: Home / Self Care | Payer: BC Managed Care – PPO | Source: Ambulatory Visit | Attending: Family Medicine | Admitting: Family Medicine

## 2019-07-24 DIAGNOSIS — Z1231 Encounter for screening mammogram for malignant neoplasm of breast: Secondary | ICD-10-CM | POA: Insufficient documentation

## 2019-07-31 ENCOUNTER — Other Ambulatory Visit: Payer: Self-pay

## 2019-07-31 ENCOUNTER — Encounter: Payer: Self-pay | Admitting: Maternal Newborn

## 2019-07-31 ENCOUNTER — Ambulatory Visit (INDEPENDENT_AMBULATORY_CARE_PROVIDER_SITE_OTHER): Payer: BC Managed Care – PPO | Admitting: Maternal Newborn

## 2019-07-31 ENCOUNTER — Other Ambulatory Visit (HOSPITAL_COMMUNITY)
Admission: RE | Admit: 2019-07-31 | Discharge: 2019-07-31 | Disposition: A | Discharge status: Home / Self Care | Payer: BC Managed Care – PPO | Source: Ambulatory Visit | Attending: Maternal Newborn | Admitting: Maternal Newborn

## 2019-07-31 VITALS — BP 104/80 | Ht 62.0 in | Wt 206.2 lb

## 2019-07-31 DIAGNOSIS — Z01419 Encounter for gynecological examination (general) (routine) without abnormal findings: Secondary | ICD-10-CM | POA: Diagnosis not present

## 2019-07-31 DIAGNOSIS — Z124 Encounter for screening for malignant neoplasm of cervix: Secondary | ICD-10-CM | POA: Insufficient documentation

## 2019-07-31 NOTE — Progress Notes (Signed)
Gynecology Annual Exam  PCP: Olin Hauser, DO  Chief Complaint:  Chief Complaint  Patient presents with  . Gynecologic Exam    History of Present Illness:Patient is a 52 y.o. [id number] presenting for an annual exam.   LMP: No LMP recorded. Patient is postmenopausal.  The patient is sexually active. She does not have dyspareunia.  The patient does perform self breast exams.  There is no notable family history of breast or ovarian cancer in her family.  The patient wears seatbelts: yes.  The patient has regular exercise: yes, fitness program at home 3 days a week.    The patient does not have current symptoms of depression. She is taking medication that is managed by her PCP and doing well.  Domestic violence screening is negative.  Review of Systems  Constitutional: Negative.   HENT: Negative.   Eyes: Negative.   Respiratory: Positive for shortness of breath and wheezing. Negative for cough.        Emphysema  Cardiovascular: Negative for chest pain and palpitations.  Gastrointestinal: Negative for abdominal pain.  Genitourinary: Negative.   Musculoskeletal: Negative.   Skin: Negative.   Neurological: Negative.   Endo/Heme/Allergies:       Hot flashes at night 1-2 days/week  Psychiatric/Behavioral: Negative.     Past Medical History:  Past Medical History:  Diagnosis Date  . ASD (atrial septal defect), common atrium (single atrium)   . 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 Heart attack ([place]) 10/26/2017  . Heart murmur   . Kidney stone     Past Surgical History:  Past Surgical History:  Procedure Laterality Date  . atrium septal defect  1999  . COLONOSCOPY WITH PROPOFOL N/A 10/17/2018   Procedure: COLONOSCOPY WITH PROPOFOL;  Surgeon: Lin Landsman, MD;  Location: [facility] ENDOSCOPY;  Service: Gastroenterology;  Laterality: N/A;  . CORONARY ANGIOPLASTY     09/2017  . CORONARY/GRAFT ACUTE MI REVASCULARIZATION N/A 10/26/2017   Procedure: Coronary/Graft Acute MI Revascularization;  Surgeon: Nelva Bush, MD;  Location: [place] CV LAB;  Service: Cardiovascular;  Laterality: N/A;  . ESOPHAGOGASTRODUODENOSCOPY (EGD) WITH PROPOFOL N/A 10/17/2018   Procedure: ESOPHAGOGASTRODUODENOSCOPY (EGD) WITH PROPOFOL;  Surgeon: Lin Landsman, MD;  Location: [facility] ENDOSCOPY;  Service: Gastroenterology;  Laterality: N/A;  . LEFT HEART CATH AND CORONARY ANGIOGRAPHY N/A 10/26/2017   Procedure: LEFT HEART CATH AND CORONARY ANGIOGRAPHY;  Surgeon: Nelva Bush, MD;  Location: [place] CV LAB;  Service: Cardiovascular;  Laterality: N/A;  . WISDOM TOOTH EXTRACTION     age 36 all four    Gynecologic History:  No LMP recorded. Patient is postmenopausal. Last Pap: 07/27/2018. Results were: NIL and HR HPV negative  Last mammogram: 07/24/2019. Results were: BI-RADS I Obstetric History: [id number]  Family History:  Family History  Problem Relation Age of Onset  . Diabetes Mother        TYPE 1  . COPD Mother   . Kidney Stones Mother   . Coronary artery disease Mother   . Heart failure Mother   . Kidney Stones Father   . Coronary artery disease Father   . Colon cancer Father 71       Stage IV  . Hashimoto's thyroiditis Sister   . Diabetes Maternal Grandmother        TYPE2  . Diabetes Maternal Grandfather        TYPE 2  . Kidney disease Neg Hx   . Bladder Cancer Neg Hx   .  Breast cancer Neg Hx   . Cervical cancer Neg Hx     Social History:  Social History   Socioeconomic History  . Marital status: Single    Spouse name: Not on file  . Number of children: 2  . Years of education: 72  . Highest education level: Not on file  Occupational History  . 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 Financial resource strain: Not on file  . Food insecurity    Worry: Not on file    Inability: Not on file  . Transportation needs    Medical: Not on file    Non-medical: Not on file   Tobacco Use  . Smoking status: Current Every Day Smoker    Packs/day: 0.25    Types: Cigarettes  . Smokeless tobacco: Never Used  . Tobacco comment: down to 4 cigs per day  Substance and Sexual Activity  . Alcohol use: Yes    Alcohol/week: 0.0 standard drinks    Frequency: Never    Comment: 4 yrs sober, very little  . Drug use: Not Currently    Types: Marijuana    Comment: Smoked once early today.  Marland Kitchen Sexual activity: Yes    Birth control/protection: Post-menopausal  Lifestyle  . Physical activity    Days per week: Not on file    Minutes per session: Not on file  . Stress: Not on file  Relationships  . Social Herbalist on phone: Not on file    Gets together: Not on file    Attends religious service: Not on file    Active member of club or organization: Not on file    Attends meetings of clubs or organizations: Not on file    Relationship status: Not on file  . Intimate partner violence    Fear of current or ex partner: Not on file    Emotionally abused: Not on file    Physically abused: Not on file    Forced sexual activity: Not on file  Other Topics Concern  . Not on file  Social History Narrative  . Not on file    Allergies:  Allergies  Allergen Reactions  . Biaxin [Clarithromycin] Nausea And Vomiting    Medications: Prior to Admission medications   Medication Sig Start Date End Date Taking? Authorizing Provider  albuterol (PROVENTIL HFA;VENTOLIN HFA) 108 (90 Base) MCG/ACT inhaler Inhale 2 puffs into the lungs every 6 (six) hours as needed for wheezing or shortness of breath. 02/15/19  Yes Karamalegos, Devonne Doughty, DO  ARIPiprazole (ABILIFY) 10 MG tablet Take 1 tablet (10 mg total) by mouth daily. 07/21/19  Yes Karamalegos, Devonne Doughty, DO  aspirin 81 MG chewable tablet Chew 1 tablet (81 mg total) by mouth daily. 10/29/17  Yes Fritzi Mandes, MD  cetirizine (ZYRTEC) 10 MG tablet Take 10 mg by mouth daily.    Yes [provider]  citalopram (CELEXA)  40 MG tablet Take 1 tablet (40 mg total) by mouth daily. 07/21/19  Yes Karamalegos, Devonne Doughty, DO  diphenhydrAMINE (BENADRYL) 25 MG tablet Take 25 mg by mouth every 4 (four) hours as needed.   Yes [provider]  ENSTILAR 0.005-0.064 % FOAM APPLY TOPICALLY TO THE AFFECTED AREA EVERY DAY 09/22/18  Yes [provider]  EUCRISA 2 % OINT APPLY TOPICALLY TO THE AFFECTED AREA EVERY NIGHT AT BEDTIME 09/22/18  Yes [provider]  pantoprazole (PROTONIX) 40 MG tablet Take 1 tablet (40 mg  total) by mouth daily before breakfast. 07/21/19  Yes Karamalegos, Devonne Doughty, DO    Physical Exam Vitals: Blood pressure 104/80, height 5\' 2"  (1.575 m), weight 206 lb 3.2 oz (93.5 kg).  General: NAD HEENT: normocephalic, anicteric Thyroid: no enlargement Pulmonary: No increased work of breathing, CTAB Cardiovascular: RRR, no murmurs, rubs, or gallops Breast: Breasts symmetrical, no tenderness, no palpable nodules or masses, no skin or nipple retraction present, no nipple discharge.  No axillary or supraclavicular lymphadenopathy. Abdomen: Soft, non-tender, non-distended.  Umbilicus without lesions.  No hepatomegaly, splenomegaly or masses palpable. No evidence of hernia  Genitourinary:  External: Normal external female genitalia.  Normal urethral  meatus, normal Bartholin's and Skene's glands.    Vagina: Normal vaginal mucosa, no evidence of prolapse.    Cervix: Grossly normal in appearance, no bleeding  Uterus: Non-enlarged, mobile, normal contour.  No CMT  Adnexa: ovaries non-enlarged, no adnexal masses  Rectal: deferred  Lymphatic: no evidence of inguinal lymphadenopathy Extremities: no edema, erythema, or tenderness Neurologic: Grossly intact Psychiatric: mood appropriate, affect full   Assessment: 53 y.o. G2P2002 here for an annual exam  Plan: Problem List Items Addressed This Visit    None    Visit Diagnoses    Women's annual routine gynecological examination    -   Primary   Pap smear for cervical cancer screening       Relevant Orders   Cytology - PAP      1) Mammogram - recommend yearly screening mammogram.  Mammogram is up to date  2) STI screening was offered and declined  3) ASCCP guidelines and rational discussed.  Patient opts for yearly screening interval  4) Osteoporosis  - per USPTF routine screening DEXA at age 32  5) Routine healthcare maintenance including cholesterol, diabetes screening discussed; managed by PCP  6) Colonoscopy due October 2022, last done in 2019.  7) Follow up 1 year for an annual exam.  Avel Sensor, CNM 07/31/2019  10:42 AM

## 2019-07-31 NOTE — Patient Instructions (Signed)

## 2019-08-02 LAB — CYTOLOGY - PAP
Adequacy: ABSENT
Diagnosis: NEGATIVE
HPV: NOT DETECTED

## 2019-08-12 ENCOUNTER — Other Ambulatory Visit: Payer: Self-pay | Admitting: Family Medicine

## 2019-08-12 DIAGNOSIS — F4310 Post-traumatic stress disorder, unspecified: Secondary | ICD-10-CM

## 2019-10-16 ENCOUNTER — Other Ambulatory Visit: Payer: Self-pay | Admitting: Family Medicine

## 2019-10-16 DIAGNOSIS — F4310 Post-traumatic stress disorder, unspecified: Secondary | ICD-10-CM

## 2019-10-20 ENCOUNTER — Other Ambulatory Visit: Payer: Self-pay

## 2019-10-20 ENCOUNTER — Ambulatory Visit (INDEPENDENT_AMBULATORY_CARE_PROVIDER_SITE_OTHER): Payer: BC Managed Care – PPO | Admitting: Family Medicine

## 2019-10-20 ENCOUNTER — Encounter: Payer: Self-pay | Admitting: Family Medicine

## 2019-10-20 VITALS — BP 121/65 | HR 85 | Temp 98.4°F | Resp 16 | Ht 62.0 in | Wt 198.0 lb

## 2019-10-20 DIAGNOSIS — F4310 Post-traumatic stress disorder, unspecified: Secondary | ICD-10-CM | POA: Diagnosis not present

## 2019-10-20 DIAGNOSIS — Z23 Encounter for immunization: Secondary | ICD-10-CM

## 2019-10-20 DIAGNOSIS — E669 Obesity, unspecified: Secondary | ICD-10-CM | POA: Diagnosis not present

## 2019-10-20 MED ORDER — ARIPIPRAZOLE 10 MG PO TABS: 10.0000 mg | ORAL_TABLET | Freq: Every day | ORAL | 1 refills | Status: AC

## 2019-10-20 NOTE — Patient Instructions (Addendum)
Thank you for coming to the office today.  Mammogram - 07/24/2019  Pap smear completed - 07/31/19  Colonoscopy 10/17/18  Flu shot today  Keep working on reducing smoking  Great job on [employer] and weight loss  Refilled Abilify generic today  Already refilled Celexa  Please schedule a Follow-up Appointment to: Return in about 5 months (around 03/19/2020) for 5 month for Mood / PHQ/GAD, Weight.  If you have any other questions or concerns, please feel free to call the office or send a message through [place]. You may also schedule an earlier appointment if necessary.  Additionally, you may be receiving a survey about your experience at our office within a few days to 1 week by e-mail or mail. We value your feedback.  Nobie Putnam, DO [place]

## 2019-10-20 NOTE — Assessment & Plan Note (Signed)
Improved weight loss on improved keto diet lifestyle

## 2019-10-20 NOTE — Progress Notes (Addendum)
Subjective:    Patient ID: Angela Mccullough, female    DOB: 1966/10/16, 53 y.o.   MRN: [id number]  Angela Mccullough is a 53 y.o. female presenting on 10/20/2019 for Weight Check   HPI   HYPERLIPIDEMIA / Obesity BMI >36 Improved weight loss now with lifestyle diet, nearly keto diet Reduced portions, higher protein, decreased appetite On Atorvastatin History of MI STEMI / HTN Followed by [facility]iology  PTSD / Chronic Recurrent Depression with Anxiety Interval update, gradual improvement of mood/PTSD/anxiety, no longer going to [facility] after Dr Miles Costain passed, she is still on medication, trialed off Abilify but did not do well, has resumed. She had problem with chantix and abilify. Still smoking however, reduced amount. - No longer w/ therapist but has Grief counselor at hospice if needed ContinuesCelexa 40mg  daily, Abilify 10mg  daily (needs refill) Admits more irritable off medicine Takes OTC Sleep Aid, difficulty falling asleep    Health Maintenance:  Screening Mammogram scheduled for 07/24/19  UTD Pap smear last done 07/27/18  UTD Colonoscopy 10/17/18, Dr Marius Ditch [facility] - multiple polyps, repeat in 2 years, anticipate 2021  Due for Flu Shot, will receive today    Depression screen [facility] 2/9 10/20/2019 07/21/2019 02/15/2019  Decreased Interest 3 3 1   Down, Depressed, Hopeless 1 2 0  PHQ - 2 Score 4 5 1   Altered sleeping 2 2 2   Tired, decreased energy 3 2 2   Change in appetite 0 1 0  Feeling bad or failure about yourself  0 0 0  Trouble concentrating 2 2 2   Moving slowly or fidgety/restless 0 0 0  Suicidal thoughts 0 0 0  PHQ-9 Score 11 12 7   Difficult doing work/chores Not difficult at all Somewhat difficult Somewhat difficult    GAD 7 : Generalized Anxiety Score 10/20/2019 07/21/2019 04/03/2019 02/15/2019  Nervous, Anxious, on Edge 1 3 (No Data) 0  Control/stop worrying 3 2 - 1  Worry too much - different things 2 2 - 0  Trouble relaxing 1 1 - 1   Restless 0 0 - 0  Easily annoyed or irritable 1 3 - 1  Afraid - awful might happen 0 1 - 0  Total GAD 7 Score 8 12 - 3  Anxiety Difficulty Not difficult at all Somewhat difficult - Not difficult at all    Social History   Tobacco Use  . Smoking status: Current Every Day Smoker    Packs/day: 0.25    Types: Cigarettes  . Smokeless tobacco: Current User  . Tobacco comment: down to 4 cigs per day  Substance Use Topics  . Alcohol use: Yes    Alcohol/week: 0.0 standard drinks    Frequency: Never    Comment: 4 yrs sober, very little  . Drug use: Not Currently    Types: Marijuana    Comment: Smoked once early today.     Review of Systems Per HPI unless specifically indicated above     Objective:    BP 121/65   Pulse 85   Temp 98.4 F (36.9 C) (Oral)   Resp 16   Ht 5\' 2"  (1.575 m)   Wt 198 lb (89.8 kg)   BMI 36.21 kg/m   Wt Readings from Last 3 Encounters:  10/20/19 198 lb (89.8 kg)  07/31/19 206 lb 3.2 oz (93.5 kg)  07/21/19 206 lb (93.4 kg)    Physical Exam Vitals signs and nursing note reviewed.  Constitutional:      General: She is not in acute  distress.    Appearance: She is well-developed. She is not diaphoretic.     Comments: Well-appearing, comfortable, cooperative  HENT:     Head: Normocephalic and atraumatic.  Eyes:     General:        Right eye: No discharge.        Left eye: No discharge.     Conjunctiva/sclera: Conjunctivae normal.  Cardiovascular:     Rate and Rhythm: Normal rate.  Pulmonary:     Effort: Pulmonary effort is normal.  Skin:    General: Skin is warm and dry.     Findings: No erythema or rash.  Neurological:     Mental Status: She is alert and oriented to person, place, and time.  Psychiatric:        Behavior: Behavior normal.     Comments: Well groomed, good eye contact, normal speech and thoughts       Results for orders placed or performed in visit on 07/31/19  Cytology - PAP  Result Value Ref Range   Adequacy       Satisfactory for evaluation  endocervical/transformation zone component ABSENT.   Diagnosis      NEGATIVE FOR INTRAEPITHELIAL LESIONS OR MALIGNANCY.   HPV NOT Detected    Material Submitted CervicoVaginal Pap [ThinPrep Imaged]       Assessment & Plan:   Problem List Items Addressed This Visit    PTSD (post-traumatic stress disorder) - Primary    Relatively stable chronic problem with mixed mood depression PTSD Previously followed by [facility] Psych Dr Miles Costain  Plan Continue to manage PTSD now with current regimen - refill Abilify 10mg  daily, continue Celexa 40mg  daily, she has not relocated Psych yet, agree to continue for now and when ready she can return to [place], especially sooner if she needs any other counseling/therapy support      Relevant Medications   ARIPiprazole (ABILIFY) 10 MG tablet   Obesity (BMI 35.0-39.9 without comorbidity)    Improved weight loss on improved keto diet lifestyle       Other Visit Diagnoses    Needs flu shot       Relevant Orders   Flu Vaccine QUAD 6+ mos PF IM (Fluarix Quad PF) (Completed)      Meds ordered this encounter  Medications  . ARIPiprazole (ABILIFY) 10 MG tablet    Sig: Take 1 tablet (10 mg total) by mouth daily.    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  Angela Putnam, DO [facility] 10/20/2019, 4:04 PM

## 2019-10-20 NOTE — Assessment & Plan Note (Signed)
Relatively stable chronic problem with mixed mood depression PTSD Previously followed by [facility] Psych Dr Miles Costain  Plan Continue to manage PTSD now with current regimen - refill Abilify 10mg  daily, continue Celexa 40mg  daily, she has not relocated Psych yet, agree to continue for now and when ready she can return to [place], especially sooner if she needs any other counseling/therapy support

## 2019-12-03 ENCOUNTER — Other Ambulatory Visit: Payer: Self-pay | Admitting: Family Medicine

## 2019-12-03 DIAGNOSIS — K219 Gastro-esophageal reflux disease without esophagitis: Secondary | ICD-10-CM

## 2020-04-11 ENCOUNTER — Other Ambulatory Visit: Payer: Self-pay

## 2020-04-11 ENCOUNTER — Ambulatory Visit: Payer: BC Managed Care – PPO | Attending: Internal Medicine

## 2020-04-11 ENCOUNTER — Telehealth (INDEPENDENT_AMBULATORY_CARE_PROVIDER_SITE_OTHER): Payer: BC Managed Care – PPO | Admitting: Family Medicine

## 2020-04-11 ENCOUNTER — Encounter: Payer: Self-pay | Admitting: Family Medicine

## 2020-04-11 DIAGNOSIS — H6690 Otitis media, unspecified, unspecified ear: Secondary | ICD-10-CM | POA: Diagnosis not present

## 2020-04-11 DIAGNOSIS — Z20822 Contact with and (suspected) exposure to covid-19: Secondary | ICD-10-CM | POA: Diagnosis not present

## 2020-04-11 MED ORDER — AMOXICILLIN 500 MG PO CAPS: 500.0000 mg | ORAL_CAPSULE | Freq: Two times a day (BID) | ORAL | 0 refills | Status: DC

## 2020-04-11 NOTE — Progress Notes (Signed)
Virtual Visit via Telephone The purpose of this virtual visit is to provide medical care while limiting exposure to the novel coronavirus (COVID19) for both patient and office staff.  Consent was obtained for phone visit:  Yes.   Answered questions that patient had about telehealth interaction:  Yes.   I discussed the limitations, risks, security and privacy concerns of performing an evaluation and management service by telephone. I also discussed with the patient that there may be a patient responsible charge related to this service. The patient expressed understanding and agreed to proceed.  Patient Location: Home Provider Location: [facility])  ---------------------------------------------------------------------- Chief Complaint  Patient presents with  . Ear Pain    Right side and sore throat onset 3 days denies chills or fever or HA    S: Reviewed CMA documentation. I have called patient and gathered additional HPI as follows:  Right Ear Pain / Sore Throat / Possible COVID19 Reports that symptoms started 3 days ago with R ear pain and then later on in day had sore throat pain. Symptoms unchanged today. Tried OTC Aleve 250mg  x 2 once a day, without relief. She went to nurse, they were worried about sore throat and possible ear infection. She was asking to be treated for this today and then she will go get COVID19 testing. - She is unable to return to school due to sick symptoms. Starting out of work half day 04/10/20 - today.  Denies any known or suspected exposure to person with or possibly with COVID19.  Admits sore throat, R ear pain Denies any fevers, chills, sweats, body ache, cough, shortness of breath, sinus pain or pressure, headache, abdominal pain, diarrhea, drainage from ear  Past Medical History:  Diagnosis Date  . ASD (atrial septal defect), common atrium (single atrium)   . 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 Heart attack  ([place]) 10/26/2017  . Heart murmur   . Kidney stone    Social History   Tobacco Use  . Smoking status: Current Every Day Smoker    Packs/day: 0.25    Types: Cigarettes  . Smokeless tobacco: Current User  . Tobacco comment: down to 4 cigs per day  Substance Use Topics  . Alcohol use: Yes    Alcohol/week: 0.0 standard drinks    Comment: 4 yrs sober, very little  . Drug use: Not Currently    Types: Marijuana    Comment: Smoked once early today.     Current Outpatient Medications:  .  albuterol (PROVENTIL HFA;VENTOLIN HFA) 108 (90 Base) MCG/ACT inhaler, Inhale 2 puffs into the lungs every 6 (six) hours as needed for wheezing or shortness of breath., Disp: 1 Inhaler, Rfl: 5 .  ARIPiprazole (ABILIFY) 10 MG tablet, Take 1 tablet (10 mg total) by mouth daily., Disp: 90 tablet, Rfl: 1 .  aspirin 81 MG chewable tablet, Chew 1 tablet (81 mg total) by mouth daily., Disp: 30 tablet, Rfl: 1 .  atorvastatin (LIPITOR) 40 MG tablet, Take by mouth., Disp: , Rfl:  .  citalopram (CELEXA) 40 MG tablet, TAKE 1 TABLET BY MOUTH EVERY DAY, Disp: 90 tablet, Rfl: 1 .  ENSTILAR 0.005-0.064 % FOAM, APPLY TOPICALLY TO THE AFFECTED AREA EVERY DAY, Disp: , Rfl: 0 .  EUCRISA 2 % OINT, APPLY TOPICALLY TO THE AFFECTED AREA EVERY NIGHT AT BEDTIME, Disp: , Rfl: 0 .  montelukast (SINGULAIR) 10 MG tablet, Take 10 mg by mouth at bedtime., Disp: , Rfl:  .  pantoprazole (PROTONIX) 40 MG tablet, TAKE 1 TABLET BY MOUTH DAILY BEFORE BREAKFAST, Disp: 90 tablet, Rfl: 1 .  Tiotropium Bromide-Olodaterol 2.5-2.5 MCG/ACT AERS, Inhale into the lungs., Disp: , Rfl:  .  benzonatate (TESSALON) 200 MG capsule, TAKE 1 CAPSULE BY MOUTH THREE TIMES A DAY AS NEEDED FOR COUGH FOR UP TO 7 DAYS, Disp: , Rfl:  .  cetirizine (ZYRTEC) 10 MG tablet, Take 10 mg by mouth daily. , Disp: , Rfl:  .  diphenhydrAMINE (BENADRYL) 25 MG tablet, Take 25 mg by mouth every 4 (four) hours as needed., Disp: , Rfl:   Depression screen [facility] 2/9 04/11/2020 10/20/2019  07/21/2019  Decreased Interest 0 3 3  Down, Depressed, Hopeless 0 1 2  PHQ - 2 Score 0 4 5  Altered sleeping 0 2 2  Tired, decreased energy 0 3 2  Change in appetite 0 0 1  Feeling bad or failure about yourself  0 0 0  Trouble concentrating 0 2 2  Moving slowly or fidgety/restless 0 0 0  Suicidal thoughts 0 0 0  PHQ-9 Score 0 11 12  Difficult doing work/chores Not difficult at all Not difficult at all Somewhat difficult    GAD 7 : Generalized Anxiety Score 10/20/2019 07/21/2019 04/03/2019 02/15/2019  Nervous, Anxious, on Edge 1 3 (No Data) 0  Control/stop worrying 3 2 - 1  Worry too much - different things 2 2 - 0  Trouble relaxing 1 1 - 1  Restless 0 0 - 0  Easily annoyed or irritable 1 3 - 1  Afraid - awful might happen 0 1 - 0  Total GAD 7 Score 8 12 - 3  Anxiety Difficulty Not difficult at all Somewhat difficult - Not difficult at all    -------------------------------------------------------------------------- O: No physical exam performed due to remote telephone encounter.  Lab results reviewed.  No results found for this or any previous visit (from the past 2160 hour(s)).  -------------------------------------------------------------------------- A&P:  Problem List Items Addressed This Visit    None    Visit Diagnoses    Acute otitis media, unspecified otitis media type    -  Primary   Relevant Medications   amoxicillin (AMOXIL) 500 MG capsule   Suspected COVID-19 virus infection         Clinically history suggestive of acute R AOM vs possible acute pharyngitis, may be viral etiology, however she had seen nurse initially in person and advised could be AOM ear infection. - She is out of work since 04/10/20 cannot return until cleared or COVID19 test negative  Advised first she needs COVID19 test, gave her contact for [facility], she will work on scheduling for later today for test.  She is afebrile. She may take NSAIDs PRN for now or Tylenol.  Start  Amoxicillin empirically 500mg  BID x 10 days, cover for AOM vs Strep as possibility. Advised that if COVID19 test negative may hold antibiotics, and then adjust work note due to covid protocol If COVID19 negative, and improving on antibiotics and afebrile, she can contact us next week for earlier return to work date likely [employer] 4/20, current note written, she will contact her employer now  No orders of the defined types were placed in this encounter.   Follow-up: - Return in 1 week as needed  Patient verbalizes understanding with the above medical recommendations including the limitation of remote medical advice.  Specific follow-up and call-back criteria were given for patient to follow-up or seek medical care more urgently if  needed.   - Time spent in direct consultation with patient on phone: 12 minutes   Nobie Putnam, [facility] 04/11/2020, 11:45 AM

## 2020-04-11 NOTE — Patient Instructions (Addendum)
Thank you for coming to the office today.  You may have coronavirus / [place] Testing Information  LOCATION:  [facility] [place] [address]  Hours: Monday - Friday 2:00pm to 5:00pm  COVID-19 Testing By Appointment Only  Online scheduling can be done online at [email or web address] or by texting "COVID" to [phone].  Test result may take 2-7 days to result. You will be notified by MyChart or by Phone.  (Pre-Admission prior to surgery or procedures at hospital will still be done at [place])  If negative test - they will call you with result. If abnormal or positive test you will be notified as well and our office will contact you to help further with treatment plan.  May take Tylenol as needed for aches pains and fever. Prefer to avoid Ibuprofen if can help it, to avoid complication from virus.  REQUIRED self quarantine to [place] - advised to avoid all exposure with others while during treatment. Should continue to quarantine for up to 7-14 days, pending resolution of symptoms, if symptoms resolve by 7 days and is afebrile >3 days - may STOP self quarantine at that time.  If symptoms do not resolve or significantly improve OR if WORSENING - fever / cough - or worsening shortness of breath - then should contact us and seek advice on next steps in treatment at home vs where/when to seek care at Urgent Care or Hospital ED for further intervention   Please schedule a Follow-up Appointment to: Return in about 1 week (around 04/18/2020), or if symptoms worsen or fail to improve, for ear infection.  If you have any other questions or concerns, please feel free to call the office or send a message through [place]. You may also schedule an earlier appointment if necessary.  Additionally, you may be receiving a survey about your experience at our office within a few days to 1  week by e-mail or mail. We value your feedback.  Nobie Putnam, DO [place]

## 2020-04-12 LAB — SARS-COV-2, NAA 2 DAY TAT

## 2020-04-12 LAB — NOVEL CORONAVIRUS, NAA: SARS-CoV-2, NAA: NOT DETECTED

## 2020-04-15 ENCOUNTER — Telehealth: Payer: Self-pay | Admitting: Family Medicine

## 2020-04-15 NOTE — Telephone Encounter (Signed)
New note written, sent on mychart.  Angela Mccullough, [facility] 04/15/2020, 6:46 PM

## 2020-04-15 NOTE — Telephone Encounter (Signed)
Patient is calling to report the results of her COVID test was negative. The antibotics are working. 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 Patient is requesting the note to be uploaded into [employer].

## 2020-08-05 ENCOUNTER — Ambulatory Visit: Payer: BC Managed Care – PPO | Admitting: Obstetrics and Gynecology

## 2020-08-05 ENCOUNTER — Other Ambulatory Visit: Payer: Self-pay

## 2020-08-05 ENCOUNTER — Telehealth (INDEPENDENT_AMBULATORY_CARE_PROVIDER_SITE_OTHER): Payer: BC Managed Care – PPO | Admitting: Family Medicine

## 2020-08-05 ENCOUNTER — Encounter: Payer: Self-pay | Admitting: Family Medicine

## 2020-08-05 DIAGNOSIS — H6690 Otitis media, unspecified, unspecified ear: Secondary | ICD-10-CM

## 2020-08-05 DIAGNOSIS — H6981 Other specified disorders of Eustachian tube, right ear: Secondary | ICD-10-CM

## 2020-08-05 MED ORDER — FLUTICASONE PROPIONATE 50 MCG/ACT NA SUSP: 2.0000 | Freq: Every day | NASAL | 3 refills | Status: DC

## 2020-08-05 MED ORDER — AMOXICILLIN 500 MG PO CAPS: 500.0000 mg | ORAL_CAPSULE | Freq: Two times a day (BID) | ORAL | 0 refills | Status: DC

## 2020-08-05 NOTE — Patient Instructions (Addendum)
Start Amoxicillin Start nasal steroid Flonase 2 sprays in each nostril daily for 4-6 weeks, may repeat course seasonally or as needed If not improving notify us we can refer to ENT if needed  Please schedule a Follow-up Appointment to: Return in about 1 week (around 08/12/2020), or if symptoms worsen or fail to improve, for ear infection/sinus.  If you have any other questions or concerns, please feel free to call the office or send a message through [facility]. You may also schedule an earlier appointment if necessary.  Additionally, you may be receiving a survey about your experience at our office within a few days to 1 week by e-mail or mail. We value your feedback.  Nobie Putnam, DO [place]

## 2020-08-05 NOTE — Progress Notes (Signed)
Subjective:    Patient ID: Angela Mccullough, female    DOB: [birth year], 54 y.o.   MRN: [id number]  Angela Mccullough is a 54 y.o. female presenting on 08/05/2020 for Otalgia (sore throat denies fever or chills, cough and runny nose as per patient may be allergy)  Patient presents for a same day appointment.  Virtual / Telehealth Encounter - Video Visit via MyChart The purpose of this virtual visit is to provide medical care while limiting exposure to the novel coronavirus (COVID19) for both patient and office staff.  Consent was obtained for remote visit:  Yes.   Answered questions that patient had about telehealth interaction:  Yes.   I discussed the limitations, risks, security and privacy concerns of performing an evaluation and management service by video/telephone. I also discussed with the patient that there may be a patient responsible charge related to this service. The patient expressed understanding and agreed to proceed.  Patient Location: Home Provider Location: [facility] (Office)   HPI   Right Ear Pain / Sore Throat  Allergic Rhinosinusitis / R Eustachian Tube Dysfunction Prior history similar problem back in 04/11/20, also had R ear pain, treated with Amoxicillin with resolution. She was on other allergy med singulair allegra in past as well - Today reports symptoms onset over past few days with R ear and jaw pain, associated sore throat, with sinus congestion, similar to last time, recurrence  Admits sinus allergy concerns. She has started some allergy OTC medication with some relief temporarily Stopped singulair ineffective. Not using nasal sprays Not seen ENT Denies any fever chills cough dyspnea  Health Maintenance: Due for 2nd dose COVID vaccine, Moderna, next dose within 1 week approx will send [place] card dates when done  Depression screen [facility] 2/9 04/11/2020 10/20/2019 07/21/2019  Decreased Interest 0 3 3  Down, Depressed, Hopeless 0 1 2  PHQ -  2 Score 0 4 5  Altered sleeping 0 2 2  Tired, decreased energy 0 3 2  Change in appetite 0 0 1  Feeling bad or failure about yourself  0 0 0  Trouble concentrating 0 2 2  Moving slowly or fidgety/restless 0 0 0  Suicidal thoughts 0 0 0  PHQ-9 Score 0 11 12  Difficult doing work/chores Not difficult at all Not difficult at all Somewhat difficult    Social History   Tobacco Use  . Smoking status: Current Every Day Smoker    Packs/day: 0.25    Types: Cigarettes  . Smokeless tobacco: Current User  . Tobacco comment: down to 4 cigs per day  Vaping Use  . Vaping Use: Never used  Substance Use Topics  . Alcohol use: Yes    Alcohol/week: 0.0 standard drinks    Comment: 4 yrs sober, very little  . Drug use: Not Currently    Types: Marijuana    Comment: Smoked once early today.     Review of Systems Per HPI unless specifically indicated above     Objective:    There were no vitals taken for this visit.  Wt Readings from Last 3 Encounters:  10/20/19 198 lb (89.8 kg)  07/31/19 206 lb 3.2 oz (93.5 kg)  07/21/19 206 lb (93.4 kg)    Physical Exam   Note examination was completely remotely via video observation objective data only  Gen - well-appearing, no acute distress or apparent pain, comfortable HEENT - eyes appear clear without discharge or redness Heart/Lungs - cannot examine virtually - observed  no evidence of coughing or labored breathing. Abd - cannot examine virtually  Skin - face visible today- no rash Neuro - awake, alert, oriented Psych - not anxious appearing   Results for orders placed or performed in visit on 04/11/20  Novel Coronavirus, NAA ([facility])   Specimen: Nasopharyngeal(NP) swabs in vial transport medium   NASOPHARYNGE  TESTING  Result Value Ref Range   SARS-CoV-2, NAA Not Detected Not Detected  SARS-COV-2, NAA 2 DAY TAT   NASOPHARYNGE  TESTING  Result Value Ref Range   SARS-CoV-2, NAA 2 DAY TAT Performed       Assessment & Plan:    Problem List Items Addressed This Visit    None    Visit Diagnoses    Dysfunction of right eustachian tube    -  Primary   Relevant Medications   fluticasone (FLONASE) 50 MCG/ACT nasal spray   Acute otitis media, unspecified otitis media type       Relevant Medications   amoxicillin (AMOXIL) 500 MG capsule      Clinically history suggestive of acute vs recurrent now 4 months later R AOM vs possible acute pharyngitis, thought allergic rhinosinusitis contributing and likely has R Eustachian Tube Dysfunction  She is afebrile S/p 1 of 2 Moderna COVID vaccines  Re order Amoxicillin empirically 500mg  BID x 10 days, cover for AOM vs Strep as possibility.  Start nasal steroid Flonase 2 sprays in each nostril daily for 4-6 weeks, may repeat course seasonally or as needed   Continue allergy meds  If recurrence again will refer to ENT  Meds ordered this encounter  Medications  . amoxicillin (AMOXIL) 500 MG capsule    Sig: Take 1 capsule (500 mg total) by mouth 2 (two) times daily. For 10 days    Dispense:  20 capsule    Refill:  0  . fluticasone (FLONASE) 50 MCG/ACT nasal spray    Sig: Place 2 sprays into both nostrils daily. Use for 4-6 weeks then stop and use seasonally or as needed.    Dispense:  16 g    Refill:  3      Follow up plan: Return in about 1 week (around 08/12/2020), or if symptoms worsen or fail to improve, for ear infection/sinus.   Patient verbalizes understanding with the above medical recommendations including the limitation of remote medical advice.  Specific follow-up and call-back criteria were given for patient to follow-up or seek medical care more urgently if needed.  Total duration of direct patient care provided via video conference: 10 minutes   Nobie Putnam, [employer] 08/05/2020, 9:36 AM

## 2020-09-11 ENCOUNTER — Encounter: Payer: Self-pay | Admitting: Obstetrics and Gynecology

## 2020-09-11 ENCOUNTER — Other Ambulatory Visit: Payer: Self-pay

## 2020-09-11 ENCOUNTER — Ambulatory Visit (INDEPENDENT_AMBULATORY_CARE_PROVIDER_SITE_OTHER): Payer: BC Managed Care – PPO | Admitting: Obstetrics and Gynecology

## 2020-09-11 VITALS — BP 122/70 | HR 71 | Resp 18 | Ht 62.0 in | Wt 188.0 lb

## 2020-09-11 DIAGNOSIS — Z1231 Encounter for screening mammogram for malignant neoplasm of breast: Secondary | ICD-10-CM

## 2020-09-11 DIAGNOSIS — Z01419 Encounter for gynecological examination (general) (routine) without abnormal findings: Secondary | ICD-10-CM

## 2020-09-11 DIAGNOSIS — N760 Acute vaginitis: Secondary | ICD-10-CM

## 2020-09-11 DIAGNOSIS — Z113 Encounter for screening for infections with a predominantly sexual mode of transmission: Secondary | ICD-10-CM

## 2020-09-11 DIAGNOSIS — L732 Hidradenitis suppurativa: Secondary | ICD-10-CM

## 2020-09-11 MED ORDER — CLINDAMYCIN PHOSPHATE 1 % EX GEL: CUTANEOUS | 0 refills | Status: DC

## 2020-09-11 NOTE — Patient Instructions (Addendum)
[facility] Recommended Dietary Allowances for Calcium and Vitamin D  Age (yr) Calcium Recommended Dietary Allowance (mg/day) Vitamin D Recommended Dietary Allowance (international units/day)  9-18 1,300 600  19-50 1,000 600  51-70 1,200 600  71 and older 1,200 800  Data from Institute of Medicine. Dietary reference intakes: calcium, vitamin D. [place], [place]: [employer]; 2011.     Exercising to Stay Healthy To become healthy and stay healthy, it is recommended that you do moderate-intensity and vigorous-intensity exercise. You can tell that you are exercising at a moderate intensity if your heart starts beating faster and you start breathing faster but can still hold a conversation. You can tell that you are exercising at a vigorous intensity if you are breathing much harder and faster and cannot hold a conversation while exercising. Exercising regularly is important. It has many health benefits, such as:  Improving overall fitness, flexibility, and endurance.  Increasing bone density.  Helping with weight control.  Decreasing body fat.  Increasing muscle strength.  Reducing stress and tension.  Improving overall health. How often should I exercise? Choose an activity that you enjoy, and set realistic goals. Your health care provider can help you make an activity plan that works for you. Exercise regularly as told by your health care provider. This may include:  Doing strength training two times a week, such as: ? Lifting weights. ? Using resistance bands. ? Push-ups. ? Sit-ups. ? Yoga.  Doing a certain intensity of exercise for a given amount of time. Choose from these options: ? A total of 150 minutes of moderate-intensity exercise every week. ? A total of 75 minutes of vigorous-intensity exercise every week. ? A mix of moderate-intensity and vigorous-intensity exercise every week. Children, pregnant women, people who  have not exercised regularly, people who are overweight, and older adults may need to talk with a health care provider about what activities are safe to do. If you have a medical condition, be sure to talk with your health care provider before you start a new exercise program. What are some exercise ideas? Moderate-intensity exercise ideas include:  Walking 1 mile (1.6 km) in about 15 minutes.  Biking.  Hiking.  Golfing.  Dancing.  Water aerobics. Vigorous-intensity exercise ideas include:  Walking 4.5 miles (7.2 km) or more in about 1 hour.  Jogging or running 5 miles (8 km) in about 1 hour.  Biking 10 miles (16.1 km) or more in about 1 hour.  Lap swimming.  Roller-skating or in-line skating.  Cross-country skiing.  Vigorous competitive sports, such as football, basketball, and soccer.  Jumping rope.  Aerobic dancing. What are some everyday activities that can help me to get exercise?  [place] work, such as: ? Pushing a Conservation officer, nature. ? Raking and bagging leaves.  Washing your car.  Pushing a stroller.  Shoveling snow.  Gardening.  Washing windows or floors. How can I be more active in my day-to-day activities?  Use stairs instead of an elevator.  Take a walk during your lunch break.  If you drive, park your car farther away from your work or school.  If you take public transportation, get off one stop early and walk the rest of the way.  Stand up or walk around during all of your indoor phone calls.  Get up, stretch, and walk around every 30 minutes throughout the day.  Enjoy exercise with a friend. Support to continue exercising will help you keep  a regular routine of activity. What guidelines can I follow while exercising?  Before you start a new exercise program, talk with your health care provider.  Do not exercise so much that you hurt yourself, feel dizzy, or get very short of breath.  Wear comfortable clothes and wear shoes with good  support.  Drink plenty of water while you exercise to prevent dehydration or heat stroke.  Work out until your breathing and your heartbeat get faster. Where to find more information  U.S. Department of Health and Human Services: BondedCompany.at  Centers for Disease Control and Prevention (CDC): http://www.wolf.info/ Summary  Exercising regularly is important. It will improve your overall fitness, flexibility, and endurance.  Regular exercise also will improve your overall health. It can help you control your weight, reduce stress, and improve your bone density.  Do not exercise so much that you hurt yourself, feel dizzy, or get very short of breath.  Before you start a new exercise program, talk with your health care provider. This information is not intended to replace advice given to you by your health care provider. Make sure you discuss any questions you have with your health care provider. Document Revised: 11/26/2017 Document Reviewed: 11/04/2017 Elsevier Patient Education  [place].   Budget-Friendly Healthy Eating There are many ways to save money at the grocery store and continue to eat healthy. You can be successful if you:  Plan meals according to your budget.  Make a grocery list and only purchase food according to your grocery list.  Prepare food yourself. What are tips for following this plan?  Reading food labels  Compare food labels between brand name foods and the store brand. Often the nutritional value is the same, but the store brand is lower cost.  Look for products that do not have added sugar, fat, or salt (sodium). These often cost the same but are healthier for you. Products may be labeled as: ? Sugar-free. ? Nonfat. ? Low-fat. ? Sodium-free. ? Low-sodium.  Look for lean ground beef labeled as at least 92% lean and 8% fat. Shopping  Buy only the items on your grocery list and go only to the areas of the store that have the items on your  list.  Use coupons only for foods and brands you normally buy. Avoid buying items you wouldn't normally buy simply because they are on sale.  Check online and in newspapers for weekly deals.  Buy healthy items from the bulk bins when available, such as herbs, spices, flour, pasta, nuts, and dried fruit.  Buy fruits and vegetables that are in season. Prices are usually lower on in-season produce.  Look at the unit price on the price tag. Use it to compare different brands and sizes to find out which item is the best deal.  Choose healthy items that are often low-cost, such as carrots, potatoes, apples, bananas, and oranges. Dried or canned beans are a low-cost protein source.  Buy in bulk and freeze extra food. Items you can buy in bulk include meats, fish, poultry, frozen fruits, and frozen vegetables.  Avoid buying "ready-to-eat" foods, such as pre-cut fruits and vegetables and pre-made salads.  If possible, shop around to discover where you can find the best prices. Consider other retailers such as dollar stores, larger [employer], local fruit and vegetable stands, and farmers markets.  Do not shop when you are hungry. If you shop while hungry, it may be hard to stick to your list and budget.  Resist impulse buying. Use your grocery list as your official plan for the week.  Buy a variety of vegetables and fruits by purchasing fresh, frozen, and canned items.  Look at the top and bottom shelves for deals. Foods at eye level (eye level of an adult or child) are usually more expensive.  Be efficient with your time when shopping. The more time you spend at the store, the more money you are likely to spend.  To save money when choosing more expensive foods like meats and dairy: ? Choose cheaper cuts of meat, such as bone-in chicken thighs and drumsticks instead of skinless and boneless chicken. When you are ready to prepare the chicken, you can remove the skin yourself to make it  healthier. ? Choose lean meats like chicken or [place] instead of beef. ? Choose canned seafood, such as tuna, salmon, or sardines. ? Buy eggs as a low-cost source of protein. ? Buy dried beans and peas, such as lentils, split peas, or kidney beans instead of meats. Dried beans and peas are a good alternative source of protein. ? Buy the larger tubs of yogurt instead of individual-sized containers.  Choose water instead of sodas and other sweetened beverages.  Avoid buying chips, cookies, and other "junk food." These items are usually expensive and not healthy. Cooking  Make extra food and freeze the extras in meal-sized containers or in individual portions for fast meals and snacks.  Pre-cook on days when you have extra time to prepare meals in advance. You can keep these meals in the fridge or freezer and reheat for a quick meal.  When you come home from the grocery store, wash, peel, and cut fruits and vegetables so they are ready to use and eat. This will help reduce food waste. Meal planning  Do not eat out or get fast food. Prepare food at home.  Make a grocery list and make sure to bring it with you to the store. If you have a smart phone, you could use your phone to create your shopping list.  Plan meals and snacks according to a grocery list and budget you create.  Use leftovers in your meal plan for the week.  Look for recipes where you can cook once and make enough food for two meals.  Include budget-friendly meals like stews, casseroles, and stir-fry dishes.  Try some meatless meals or try "no cook" meals like salads.  Make sure that half your plate is filled with fruits or vegetables. Choose from fresh, frozen, or canned fruits and vegetables. If eating canned, remember to rinse them before eating. This will remove any excess salt added for packaging. Summary  Eating healthy on a budget is possible if you plan your meals according to your budget, purchase according to  your budget and grocery list, and prepare food yourself.  Tips for buying more food on a limited budget include buying generic brands, using coupons only for foods you normally buy, and buying healthy items from the bulk bins when available.  Tips for buying cheaper food to replace expensive food include choosing cheaper, lean cuts of meat, and buying dried beans and peas. This information is not intended to replace advice given to you by your health care provider. Make sure you discuss any questions you have with your health care provider. Document Revised: 12/15/2017 Document Reviewed: 12/15/2017 Elsevier Patient Education  2020 [place] protect organs, store calcium, anchor muscles, and support the whole body. Keeping  your bones strong is important, especially as you get older. You can take actions to help keep your bones strong and healthy. Why is keeping my bones healthy important?  Keeping your bones healthy is important because your body constantly replaces bone cells. Cells get old, and new cells take their place. As we age, we lose bone cells because the body may not be able to make enough new cells to replace the old cells. The amount of bone cells and bone tissue you have is referred to as bone mass. The higher your bone mass, the stronger your bones. The aging process leads to an overall loss of bone mass in the body, which can increase the likelihood of:  Joint pain and stiffness.  Broken bones.  A condition in which the bones become weak and brittle (osteoporosis). A large decline in bone mass occurs in older adults. In women, it occurs about the time of menopause. What actions can I take to keep my bones healthy? Good health habits are important for maintaining healthy bones. This includes eating nutritious foods and exercising regularly. To have healthy bones, you need to get enough of the right minerals and vitamins. Most nutrition experts recommend  getting these nutrients from the foods that you eat. In some cases, taking supplements may also be recommended. Doing certain types of exercise is also important for bone health. What are the nutritional recommendations for healthy bones?  Eating a well-balanced diet with plenty of calcium and vitamin D will help to protect your bones. Nutritional recommendations vary from person to person. Ask your health care provider what is healthy for you. Here are some general guidelines. Get enough calcium Calcium is the most important (essential) mineral for bone health. Most people can get enough calcium from their diet, but supplements may be recommended for people who are at risk for osteoporosis. Good sources of calcium include:  Dairy products, such as low-fat or nonfat milk, cheese, and yogurt.  Dark green leafy vegetables, such as bok choy and broccoli.  Calcium-fortified foods, such as orange juice, cereal, bread, soy beverages, and tofu products.  Nuts, such as almonds. Follow these recommended amounts for daily calcium intake:  Children, age 26-3: 700 mg.  Children, age 54-8: 1,000 mg.  Children, age 262-13: 1,300 mg.  Teens, age 5-18: 1,300 mg.  Adults, age 34-50: 1,000 mg.  Adults, age 71-70: ? Men: 1,000 mg. ? Women: 1,200 mg.  Adults, age 69 or older: 1,200 mg.  Pregnant and breastfeeding females: ? Teens: 1,300 mg. ? Adults: 1,000 mg. Get enough vitamin D Vitamin D is the most essential vitamin for bone health. It helps the body absorb calcium. Sunlight stimulates the skin to make vitamin D, so be sure to get enough sunlight. If you live in a cold climate or you do not get outside often, your health care provider may recommend that you take vitamin D supplements. Good sources of vitamin D in your diet include:  Egg yolks.  Saltwater fish.  Milk and cereal fortified with vitamin D. Follow these recommended amounts for daily vitamin D intake:  Children and teens, age  56-18: 600 international units.  Adults, age 540 or younger: 400-800 international units.  Adults, age 544 or older: 800-1,000 international units. Get other important nutrients Other nutrients that are important for bone health include:  Phosphorus. This mineral is found in meat, poultry, dairy foods, nuts, and legumes. The recommended daily intake for adult men and adult women is 700 mg.  Magnesium.  This mineral is found in seeds, nuts, dark green vegetables, and legumes. The recommended daily intake for adult men is 400-420 mg. For adult women, it is 310-320 mg.  Vitamin K. This vitamin is found in green leafy vegetables. The recommended daily intake is 120 mg for adult men and 90 mg for adult women. What type of physical activity is best for building and maintaining healthy bones? Weight-bearing and strength-building activities are important for building and maintaining healthy bones. Weight-bearing activities cause muscles and bones to work against gravity. Strength-building activities increase the strength of the muscles that support bones. Weight-bearing and muscle-building activities include:  Walking and hiking.  Jogging and running.  Dancing.  Gym exercises.  Lifting weights.  Tennis and racquetball.  Climbing stairs.  Aerobics. Adults should get at least 30 minutes of moderate physical activity on most days. Children should get at least 60 minutes of moderate physical activity on most days. Ask your health care provider what type of exercise is best for you. How can I find out if my bone mass is low? Bone mass can be measured with an X-ray test called a bone mineral density (BMD) test. This test is recommended for all women who are age 68 or older. It may also be recommended for:  Men who are age 2 or older.  People who are at risk for osteoporosis because of: ? Having bones that break easily. ? Having a long-term disease that weakens bones, such as kidney disease or  rheumatoid arthritis. ? Having menopause earlier than normal. ? Taking medicine that weakens bones, such as steroids, thyroid hormones, or hormone treatment for breast cancer or prostate cancer. ? Smoking. ? Drinking three or more alcoholic drinks a day. If you find that you have a low bone mass, you may be able to prevent osteoporosis or further bone loss by changing your diet and lifestyle. Where can I find more information? For more information, check out the following websites:  [place]: AviationTales.fr  [employer] of Health: www.bones.SouthExposed.es  International Osteoporosis Foundation: Administrator.iofbonehealth.org Summary  The aging process leads to an overall loss of bone mass in the body, which can increase the likelihood of broken bones and osteoporosis.  Eating a well-balanced diet with plenty of calcium and vitamin D will help to protect your bones.  Weight-bearing and strength-building activities are also important for building and maintaining strong bones.  Bone mass can be measured with an X-ray test called a bone mineral density (BMD) test. This information is not intended to replace advice given to you by your health care provider. Make sure you discuss any questions you have with your health care provider. Document Revised: 01/10/2018 Document Reviewed: 01/10/2018 Elsevier Patient Education  2020 [place].   Hidradenitis Suppurativa Hidradenitis suppurativa is a long-term (chronic) skin disease. It is similar to a severe form of acne, but it affects areas of the body where acne would be unusual, especially areas of the body where skin rubs against skin and becomes moist. These include:  Underarms.  Groin.  Genital area.  Buttocks.  Upper thighs.  Breasts. Hidradenitis suppurativa may start out as small lumps or pimples caused by blocked sweat glands or hair follicles. Pimples may develop into deep sores that break open  (rupture) and drain pus. Over time, affected areas of skin may thicken and become scarred. This condition is rare and does not spread from person to person (non-contagious). What are the causes? The exact cause of this condition is not  known. It may be related to:  Female and female hormones.  An overactive disease-fighting system (immune system). The immune system may over-react to blocked hair follicles or sweat glands and cause swelling and pus-filled sores. What increases the risk? You are more likely to develop this condition if you:  Are female.  Are 71-43 years old.  Have a family history of hidradenitis suppurativa.  Have a personal history of acne.  Are overweight.  Smoke.  Take the medicine lithium. What are the signs or symptoms? The first symptoms are usually painful bumps in the skin, similar to pimples. The condition may get worse over time (progress), or it may only cause mild symptoms. If the disease progresses, symptoms may include:  Skin bumps getting bigger and growing deeper into the skin.  Bumps rupturing and draining pus.  Itchy, infected skin.  Skin getting thicker and scarred.  Tunnels under the skin (fistulas) where pus drains from a bump.  Pain during daily activities, such as pain during walking if your groin area is affected.  Emotional problems, such as stress or depression. This condition may affect your appearance and your ability or willingness to wear certain clothes or do certain activities. How is this diagnosed? This condition is diagnosed by a health care provider who specializes in skin diseases (dermatologist). You may be diagnosed based on:  Your symptoms and medical history.  A physical exam.  Testing a pus sample for infection.  Blood tests. How is this treated? Your treatment will depend on how severe your symptoms are. The same treatment will not work for everybody with this condition. You may need to try several treatments  to find what works best for you. Treatment may include:  Cleaning and bandaging (dressing) your wounds as needed.  Lifestyle changes, such as new skin care routines.  Taking medicines, such as: ? Antibiotics. ? Acne medicines. ? Medicines to reduce the activity of the immune system. ? A diabetes medicine (metformin). ? Birth control pills, for women. ? Steroids to reduce swelling and pain.  Working with a mental health care provider, if you experience emotional distress due to this condition. If you have severe symptoms that do not get better with medicine, you may need surgery. Surgery may involve:  Using a laser to clear the skin and remove hair follicles.  Opening and draining deep sores.  Removing the areas of skin that are diseased and scarred. Follow these instructions at home: Medicines   Take over-the-counter and prescription medicines only as told by your health care provider.  If you were prescribed an antibiotic medicine, take it as told by your health care provider. Do not stop taking the antibiotic even if your condition improves. Skin care  If you have open wounds, cover them with a clean dressing as told by your health care provider. Keep wounds clean by washing them gently with soap and water when you bathe.  Do not shave the areas where you get hidradenitis suppurativa.  Do not wear deodorant.  Wear loose-fitting clothes.  Try to avoid getting overheated or sweaty. If you get sweaty or wet, change into clean, dry clothes as soon as you can.  To help relieve pain and itchiness, cover sore areas with a warm, clean washcloth (warm compress) for 5-10 minutes as often as needed.  If told by your health care provider, take a bleach bath twice a week: ? Fill your bathtub halfway with water. ? Pour in  cup of unscented household bleach. ? Soak  in the tub for 5-10 minutes. ? Only soak from the neck down. Avoid water on your face and hair. ? Shower to rinse off  the bleach from your skin. General instructions  Learn as much as you can about your disease so that you have an active role in your treatment. Work closely with your health care provider to find treatments that work for you.  If you are overweight, work with your health care provider to lose weight as recommended.  Do not use any products that contain nicotine or tobacco, such as cigarettes and e-cigarettes. If you need help quitting, ask your health care provider.  If you struggle with living with this condition, talk with your health care provider or work with a mental health care provider as recommended.  Keep all follow-up visits as told by your health care provider. This is important. Where to find more information  Hidradenitis [place].: https://www.hs-foundation.org/ Contact a health care provider if you have:  A flare-up of hidradenitis suppurativa.  A fever or chills.  Trouble controlling your symptoms at home.  Trouble doing your daily activities because of your symptoms.  Trouble dealing with emotional problems related to your condition. Summary  Hidradenitis suppurativa is a long-term (chronic) skin disease. It is similar to a severe form of acne, but it affects areas of the body where acne would be unusual.  The first symptoms are usually painful bumps in the skin, similar to pimples. The condition may get worse over time (progress), or it may only cause mild symptoms.  If you have open wounds, cover them with a clean dressing as told by your health care provider. Keep wounds clean by washing them gently with soap and water when you bathe.  Besides skin care, treatment may include medicines, laser treatment, and surgery. This information is not intended to replace advice given to you by your health care provider. Make sure you discuss any questions you have with your health care provider. Document Revised: 12/22/2017 Document Reviewed:  12/22/2017 Elsevier Patient Education  2020 [employer].

## 2020-09-11 NOTE — Progress Notes (Signed)
Gynecology Annual Exam  PCP: Olin Hauser, DO  Chief Complaint:  Chief Complaint  Patient presents with  . Gynecologic Exam    annual exam    History of Present Illness: Patient is a 54 y.o. G2P2002 presents for annual exam. The patient has no complaints today.   LMP: No LMP recorded. Patient is postmenopausal. Denies postmenopausal bleeding  The patient is sexually active. She denies dyspareunia.  The patient does perform self breast exams.  There is no notable family history of breast or ovarian cancer in her family.  The patient denies current symptoms of depression.    Review of Systems: ROS  Past Medical History:  Patient Active Problem List   Diagnosis Date Noted  . Centrilobular emphysema ([place]) 07/17/2019  . Abnormal ECG 04/03/2019  . Obesity (BMI 35.0-39.9 without comorbidity) 02/15/2019  . OSA (obstructive sleep apnea) 02/15/2019    [facility] - 02/25/19 - predominantly REM dependent OSA, AHI 12.3, desat 86% - supine CPAP Titration 03/31/19 - significant improvement nasal CPAP - final pressure 7 cm H2O, ResMed N20 mask, size med, humidifier yes. - Signed faxed order 04/05/19 for CPAP therapy   . Positive colorectal cancer screening using Cologuard test   . Cigarette smoker 07/31/2018  . Hyperlipidemia 07/18/2018  . Weight gain 06/23/2018  . Coronary artery disease involving native coronary artery of native heart with angina pectoris ([place]) 11/03/2017    Overview:  Sp STEMI of OM3 09/2017   . History of ST elevation myocardial infarction (STEMI) 10/26/2017  . 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 Allergic rhinitis 03/23/2016  . Chronic fatigue 01/20/2016  . Constipation 01/20/2016  . PTSD (post-traumatic stress disorder) 11/20/2015  . Chronic cough 11/20/2015  . Nicotine dependence 10/03/2015  . Depression with anxiety 07/09/2009    Qualifier: Diagnosis of  By: Copland MD, Frederico Hamman     . GERD  07/09/2009    Qualifier: Diagnosis of  By: Lorelei Pont MD, Frederico Hamman     . DYSFUNCTIONAL UTERINE BLEEDING 07/09/2009    Qualifier: Diagnosis of  By: Lorelei Pont MD, Frederico Hamman     . NEPHROLITHIASIS, HX OF 07/09/2009    Qualifier: Diagnosis of  By: Lorelei Pont MD, Frederico Hamman       Past Surgical History:  Past Surgical History:  Procedure Laterality Date  . atrium septal defect  1999  . COLONOSCOPY WITH PROPOFOL N/A 10/17/2018   Procedure: COLONOSCOPY WITH PROPOFOL;  Surgeon: Lin Landsman, MD;  Location: [facility] ENDOSCOPY;  Service: Gastroenterology;  Laterality: N/A;  . CORONARY ANGIOPLASTY     09/2017  . CORONARY/GRAFT ACUTE MI REVASCULARIZATION N/A 10/26/2017   Procedure: Coronary/Graft Acute MI Revascularization;  Surgeon: Nelva Bush, MD;  Location: [place] CV LAB;  Service: Cardiovascular;  Laterality: N/A;  . ESOPHAGOGASTRODUODENOSCOPY (EGD) WITH PROPOFOL N/A 10/17/2018   Procedure: ESOPHAGOGASTRODUODENOSCOPY (EGD) WITH PROPOFOL;  Surgeon: Lin Landsman, MD;  Location: [facility] ENDOSCOPY;  Service: Gastroenterology;  Laterality: N/A;  . LEFT HEART CATH AND CORONARY ANGIOGRAPHY N/A 10/26/2017   Procedure: LEFT HEART CATH AND CORONARY ANGIOGRAPHY;  Surgeon: Nelva Bush, MD;  Location: [place] [facility];  Service: Cardiovascular;  Laterality: N/A;  . WISDOM TOOTH EXTRACTION     age 40 all four    Gynecologic History:  No LMP recorded. Patient is postmenopausal. Last Pap: Results were: 2020 NIL  Last mammogram: 2020 Results were: BI-RAD I  Obstetric History: [id number]  Family History:  Family History  Problem Relation Age of Onset  .  Diabetes Mother        TYPE 1  . COPD Mother   . Kidney Stones Mother   . Coronary artery disease Mother   . Heart failure Mother   . Kidney Stones Father   . Coronary artery disease Father   . Colon cancer Father 50       Stage IV  . Hashimoto's thyroiditis Sister   . Diabetes Maternal Grandmother        TYPE2  . Diabetes  Maternal Grandfather        TYPE 2  . Kidney disease Neg Hx   . Bladder Cancer Neg Hx   . Breast cancer Neg Hx   . Cervical cancer Neg Hx     Social History:  Social History   Socioeconomic History  . Marital status: Single    Spouse name: Not on file  . Number of children: 2  . Years of education: 48  . Highest education level: Not on file  Occupational History  . 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 Smoking status: Current Every Day Smoker    Packs/day: 0.25    Types: Cigarettes  . Smokeless tobacco: Current User  . Tobacco comment: down to 4 cigs per day  Vaping Use  . Vaping Use: Never used  Substance and Sexual Activity  . Alcohol use: Yes    Alcohol/week: 0.0 standard drinks    Comment: 4 yrs sober, very little  . Drug use: Not Currently    Types: Marijuana    Comment: Smoked once early today.   Marland Kitchen Sexual activity: Yes    Birth control/protection: Post-menopausal  Other Topics Concern  . Not on file  Social History Narrative  . Not on file   Social Determinants of Health   Financial Resource Strain:   . Difficulty of Paying Living Expenses: Not on file  Food Insecurity:   . Worried About Charity fundraiser in the Last Year: Not on file  . Ran Out of Food in the Last Year: Not on file  Transportation Needs:   . Lack of Transportation (Medical): Not on file  . Lack of Transportation (Non-Medical): Not on file  Physical Activity:   . Days of Exercise per Week: Not on file  . Minutes of Exercise per Session: Not on file  Stress:   . Feeling of Stress : Not on file  Social Connections:   . Frequency of Communication with Friends and Family: Not on file  . Frequency of Social Gatherings with Friends and Family: Not on file  . Attends Religious Services: Not on file  . Active Member of Clubs or Organizations: Not on file  . Attends Archivist Meetings: Not on file  . Marital Status: Not on file  Intimate Partner  Violence:   . Fear of Current or Ex-Partner: Not on file  . Emotionally Abused: Not on file  . Physically Abused: Not on file  . Sexually Abused: Not on file    Allergies:  Allergies  Allergen Reactions  . Biaxin [Clarithromycin] Nausea And Vomiting    Medications: Prior to Admission medications   Medication Sig Start Date End Date Taking? Authorizing Provider  albuterol (PROVENTIL HFA;VENTOLIN HFA) 108 (90 Base) MCG/ACT inhaler Inhale 2 puffs into the lungs every 6 (six) hours as needed for wheezing or shortness of breath. 02/15/19  Yes Karamalegos, Devonne Doughty, DO  amoxicillin (AMOXIL) 500 MG capsule Take 1 capsule (500 mg total)  by mouth 2 (two) times daily. For 10 days 08/05/20  Yes Karamalegos, Devonne Doughty, DO  ARIPiprazole (ABILIFY) 10 MG tablet Take 1 tablet (10 mg total) by mouth daily. 10/20/19  Yes Karamalegos, Devonne Doughty, DO  aspirin 81 MG chewable tablet Chew 1 tablet (81 mg total) by mouth daily. 10/29/17  Yes Fritzi Mandes, MD  benzonatate (TESSALON) 200 MG capsule TAKE 1 CAPSULE BY MOUTH THREE TIMES A DAY AS NEEDED FOR COUGH FOR UP TO 7 DAYS 01/24/20  Yes [provider]  cetirizine (ZYRTEC) 10 MG tablet Take 10 mg by mouth daily.    Yes [provider]  citalopram (CELEXA) 40 MG tablet TAKE 1 TABLET BY MOUTH EVERY DAY 10/17/19  Yes Karamalegos, Devonne Doughty, DO  diphenhydrAMINE (BENADRYL) 25 MG tablet Take 25 mg by mouth every 4 (four) hours as needed.   Yes [provider]  ENSTILAR 0.005-0.064 % FOAM APPLY TOPICALLY TO THE AFFECTED AREA EVERY DAY 09/22/18  Yes [provider]  EUCRISA 2 % OINT APPLY TOPICALLY TO THE AFFECTED AREA EVERY NIGHT AT BEDTIME 09/22/18  Yes [provider]  fluticasone (FLONASE) 50 MCG/ACT nasal spray Place 2 sprays into both nostrils daily. Use for 4-6 weeks then stop and use seasonally or as needed. 08/05/20  Yes Karamalegos, Devonne Doughty, DO  fluticasone (FLONASE) 50 MCG/ACT nasal spray Place into the nose.  08/05/20  Yes [provider]  pantoprazole (PROTONIX) 40 MG tablet TAKE 1 TABLET BY MOUTH DAILY BEFORE BREAKFAST 12/03/19  Yes Karamalegos, Devonne Doughty, DO  Tiotropium Bromide-Olodaterol 2.5-2.5 MCG/ACT AERS Inhale into the lungs. 08/17/19  Yes [provider]  atorvastatin (LIPITOR) 80 MG tablet Take 80 mg by mouth daily. 09/06/20   [provider]  buPROPion (WELLBUTRIN) 75 MG tablet Take 75 mg by mouth daily. 09/04/20   [provider]  citalopram (CELEXA) 40 MG tablet Take by mouth.    [provider]    Physical Exam Vitals: Blood pressure 122/70, pulse 71, resp. rate 18, height 5\' 2"  (1.575 m), weight 188 lb (85.3 kg), SpO2 98 %.  General: NAD HEENT: normocephalic, anicteric Thyroid: no enlargement, no palpable nodules Pulmonary: No increased work of breathing, CTAB Cardiovascular: RRR, distal pulses 2+ Breast: Breast symmetrical, no tenderness, no palpable nodules or masses, no skin or nipple retraction present, no nipple discharge.  No axillary or supraclavicular lymphadenopathy. Abdomen: NABS, soft, non-tender, non-distended.  Umbilicus without lesions.  No hepatomegaly, splenomegaly or masses palpable. No evidence of hernia  Genitourinary:  External: Normal external female genitalia.  Normal urethral meatus, normal Bartholin's and Skene's glands.  - Evidence of hidradenitis suppurative in groin, mild past scarring noted  Vagina: Normal vaginal mucosa, no evidence of prolapse.    Cervix: Grossly normal in appearance, no bleeding  Uterus: Non-enlarged, mobile, normal contour.  No CMT  Adnexa: ovaries non-enlarged, no adnexal masses  Rectal: deferred  Lymphatic: no evidence of inguinal lymphadenopathy Extremities: no edema, erythema, or tenderness Neurologic: Grossly intact Psychiatric: mood appropriate, affect full  Female chaperone present for pelvic and breast  portions of the physical exam    Assessment: 54 y.o. G2P2002 routine  annual exam  Plan: Problem List Items Addressed This Visit    None    Visit Diagnoses    Women's annual routine gynecological examination    -  Primary   Breast cancer screening by mammogram       Relevant Orders   MM 3D SCREEN BREAST BILATERAL   Encounter for gynecological examination without abnormal  finding       Vulval hidradenitis suppurativa       Relevant Medications   clindamycin (CLINDAGEL) 1 % gel   Screen for STD (sexually transmitted disease)       Relevant Orders   NuSwab Vaginitis Plus (VG+)   Acute vaginitis       Relevant Orders   NuSwab Vaginitis Plus (VG+)      1) Mammogram - recommend yearly screening mammogram.  Mammogram Was ordered today  2) STI screening  was offered and accepted  3) ASCCP guidelines and rational discussed.  Patient opts for every 5 years screening interval  4) Colonoscopy -- Screening recommended starting at age 85 for average risk individuals, age 76 for individuals deemed at increased risk (including African Americans) and recommended to continue until age 73.  For patient age 63-85 individualized approach is recommended.  Gold standard screening is via colonoscopy, Cologuard screening is an acceptable alternative for patient unwilling or unable to undergo colonoscopy.  "Colorectal cancer screening for average?risk adults: 2018 guideline update from the American Cancer Society"CA: A Cancer Journal for Clinicians: May 26, 2017   6) Routine healthcare maintenance including cholesterol, diabetes screening discussed managed by PCP  7) Rx for hidradenitis suppurativa given. Discussed care.   8) Return in about 1 year (around 09/11/2021) for annual.   [facility] 09/11/2020, 4:43 PM

## 2020-09-12 ENCOUNTER — Other Ambulatory Visit: Payer: Self-pay | Admitting: Obstetrics and Gynecology

## 2020-09-12 DIAGNOSIS — L732 Hidradenitis suppurativa: Secondary | ICD-10-CM

## 2020-09-17 ENCOUNTER — Other Ambulatory Visit: Payer: Self-pay | Admitting: Obstetrics and Gynecology

## 2020-09-17 DIAGNOSIS — A599 Trichomoniasis, unspecified: Secondary | ICD-10-CM

## 2020-09-17 LAB — NUSWAB VAGINITIS PLUS (VG+)
Candida albicans, NAA: NEGATIVE
Candida glabrata, NAA: NEGATIVE
Chlamydia trachomatis, NAA: NEGATIVE
Neisseria gonorrhoeae, NAA: NEGATIVE
Trich vag by NAA: POSITIVE — AB

## 2020-09-17 MED ORDER — METRONIDAZOLE 500 MG PO TABS: 2000.0000 mg | ORAL_TABLET | Freq: Once | ORAL | 1 refills | Status: AC

## 2020-10-03 NOTE — Telephone Encounter (Signed)
See refill

## 2020-11-05 ENCOUNTER — Other Ambulatory Visit: Payer: Self-pay | Admitting: Family Medicine

## 2020-11-05 DIAGNOSIS — F4310 Post-traumatic stress disorder, unspecified: Secondary | ICD-10-CM

## 2020-11-05 NOTE — Telephone Encounter (Signed)
Requested medication (s) are due for refill today: yes  Requested medication (s) are on the active medication list: yes  Last refill:  10/17/19 was given 6 month fill but no refills in the past 6 months Future visit scheduled: no  Notes to clinic:  please review- was not sure if her med can just be restarted or if she would need to titrate her dose.   Requested Prescriptions  Pending Prescriptions Disp Refills   citalopram (CELEXA) 40 MG tablet 90 tablet 1    Sig: Take 1 tablet (40 mg total) by mouth daily.      Psychiatry:  Antidepressants - SSRI Passed - 11/05/2020  5:03 PM      Passed - Completed PHQ-2 or PHQ-9 in the last 360 days      Passed - Valid encounter within last 6 months    Recent Outpatient Visits           3 months ago Dysfunction of right eustachian tube   [place], DO   6 months ago Acute otitis media, unspecified otitis media type   [place], DO   1 year ago PTSD (post-traumatic stress disorder)   [place], DO   1 year ago Annual physical exam   [place], DO   1 year ago Acute left-sided back pain with sciatica   [facility] Olin Hauser, DO

## 2020-11-05 NOTE — Telephone Encounter (Signed)
Copied from [place] [id number]. Topic: Quick Communication - Rx Refill/Question >> Nov 05, 2020  4:54 PM Yvette Rack wrote: Medication: citalopram (CELEXA) 40 MG tablet  Has the patient contacted their pharmacy? no  Preferred Pharmacy (with phone number or street name): [id number] - [place], [address]   Phone: [phone]   Fax: [phone]  Agent: Please be advised that RX refills may take up to 3 business days. We ask that you follow-up with your pharmacy.

## 2020-11-06 MED ORDER — CITALOPRAM HYDROBROMIDE 40 MG PO TABS: 40.0000 mg | ORAL_TABLET | Freq: Every day | ORAL | 1 refills | Status: DC

## 2020-12-03 ENCOUNTER — Other Ambulatory Visit: Payer: Self-pay | Admitting: Family Medicine

## 2020-12-03 DIAGNOSIS — H6981 Other specified disorders of Eustachian tube, right ear: Secondary | ICD-10-CM

## 2021-01-20 ENCOUNTER — Other Ambulatory Visit: Payer: Self-pay | Admitting: Family Medicine

## 2021-01-20 DIAGNOSIS — H6981 Other specified disorders of Eustachian tube, right ear: Secondary | ICD-10-CM

## 2021-03-05 ENCOUNTER — Encounter: Payer: Self-pay | Admitting: Family Medicine

## 2021-03-05 ENCOUNTER — Telehealth (INDEPENDENT_AMBULATORY_CARE_PROVIDER_SITE_OTHER): Payer: BC Managed Care – PPO | Admitting: Family Medicine

## 2021-03-05 ENCOUNTER — Other Ambulatory Visit: Payer: Self-pay

## 2021-03-05 VITALS — Ht 62.0 in | Wt 185.0 lb

## 2021-03-05 DIAGNOSIS — G4733 Obstructive sleep apnea (adult) (pediatric): Secondary | ICD-10-CM

## 2021-03-05 DIAGNOSIS — I25119 Atherosclerotic heart disease of native coronary artery with unspecified angina pectoris: Secondary | ICD-10-CM

## 2021-03-05 DIAGNOSIS — J432 Centrilobular emphysema: Secondary | ICD-10-CM

## 2021-03-05 DIAGNOSIS — U071 COVID-19: Secondary | ICD-10-CM

## 2021-03-05 DIAGNOSIS — R053 Chronic cough: Secondary | ICD-10-CM

## 2021-03-05 LAB — NOVEL CORONAVIRUS, NAA: SARS-CoV-2, NAA: POSITIVE

## 2021-03-05 MED ORDER — HYDROCOD POLST-CPM POLST ER 10-8 MG/5ML PO SUER: 5.0000 mL | Freq: Two times a day (BID) | ORAL | 0 refills | Status: DC | PRN

## 2021-03-05 MED ORDER — ALBUTEROL SULFATE HFA 108 (90 BASE) MCG/ACT IN AERS: 2.0000 | INHALATION_SPRAY | Freq: Four times a day (QID) | RESPIRATORY_TRACT | 5 refills | Status: AC | PRN

## 2021-03-05 MED ORDER — PREDNISONE 50 MG PO TABS: 50.0000 mg | ORAL_TABLET | Freq: Every day | ORAL | 0 refills | Status: DC

## 2021-03-05 NOTE — Patient Instructions (Addendum)
Stay tuned for someone to contact you from the [facility] Team.  Start Tussionex cough syrup Start prednisone burst 50 daily for 5 days when ready Refilled Albuterol  Please schedule a Follow-up Appointment to: Return if symptoms worsen or fail to improve.  If you have any other questions or concerns, please feel free to call the office or send a message through [place]. You may also schedule an earlier appointment if necessary.  Additionally, you may be receiving a survey about your experience at our office within a few days to 1 week by e-mail or mail. We value your feedback.  Angela Putnam, DO [place]

## 2021-03-05 NOTE — Progress Notes (Signed)
Virtual Visit via Telephone The purpose of this virtual visit is to provide medical care while limiting exposure to the novel coronavirus (COVID19) for both patient and office staff.  Consent was obtained for phone visit:  Yes.   Answered questions that patient had about telehealth interaction:  Yes.   I discussed the limitations, risks, security and privacy concerns of performing an evaluation and management service by telephone. I also discussed with the patient that there may be a patient responsible charge related to this service. The patient expressed understanding and agreed to proceed.  Patient Location: Home Provider Location: [facility] (Office)  Participants in virtual visit: - Patient: [place]: Orinda Kenner, CMA - Provider: Dr Parks Ranger  ---------------------------------------------------------------------- Chief Complaint  Patient presents with  . Cough  . Headache  . Nasal Congestion  . Shortness of Breath    S: Reviewed CMA documentation. I have called patient and gathered additional HPI as follows:  COVID19 Positive Centrilobular Emphysema OSA Coronary Artery Disease Reports that symptoms started yesterday 03/04/21 with cough, congestion, headache, her coworker tested positive for COVID and she tested today with rapid home test and it was POSITIVE today on 03/05/21. Now has worsening cough, productive at times with dyspnea. Using albuterol inhaler more often. In past improved w/ tussionex and prednisone with COPD flare.  Currently out of work, advised cannot return until Monday 3/14  Denies any fevers, chills, sweats, body ache, abdominal pain, diarrhea  Past Medical History:  Diagnosis Date  . ASD (atrial septal defect), common atrium (single atrium)   . 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 Heart attack ([place]) 10/26/2017  . Heart murmur   . Kidney stone    Social History   Tobacco Use  . Smoking status:  Current Every Day Smoker    Packs/day: 0.25    Types: Cigarettes  . Smokeless tobacco: Current User  . Tobacco comment: down to 4 cigs per day  Vaping Use  . Vaping Use: Never used  Substance Use Topics  . Alcohol use: Yes    Alcohol/week: 0.0 standard drinks    Comment: 4 yrs sober, very little  . Drug use: Not Currently    Types: Marijuana    Comment: Smoked once early today.     Current Outpatient Medications:  .  ARIPiprazole (ABILIFY) 10 MG tablet, Take 1 tablet (10 mg total) by mouth daily., Disp: 90 tablet, Rfl: 1 .  aspirin 81 MG chewable tablet, Chew 1 tablet (81 mg total) by mouth daily., Disp: 30 tablet, Rfl: 1 .  atorvastatin (LIPITOR) 80 MG tablet, Take 80 mg by mouth daily., Disp: , Rfl:  .  buPROPion (WELLBUTRIN) 75 MG tablet, Take 75 mg by mouth daily., Disp: , Rfl:  .  cetirizine (ZYRTEC) 10 MG tablet, Take 10 mg by mouth daily. , Disp: , Rfl:  .  chlorpheniramine-HYDROcodone (TUSSIONEX PENNKINETIC ER) 10-8 MG/5ML SUER, Take 5 mLs by mouth every 12 (twelve) hours as needed for cough., Disp: 115 mL, Rfl: 0 .  citalopram (CELEXA) 40 MG tablet, Take 1 tablet (40 mg total) by mouth daily. Start with half tab daily for 2 weeks then increase to 1 full tab 40mg  daily only for initial rx., Disp: 90 tablet, Rfl: 1 .  clindamycin (CLEOCIN T) 1 % lotion, Apply to affected area once a day, Disp: 60 mL, Rfl: 0 .  diphenhydrAMINE (BENADRYL) 25 MG tablet, Take 25 mg by mouth every 4 (four)  hours as needed., Disp: , Rfl:  .  ENSTILAR 0.005-0.064 % FOAM, APPLY TOPICALLY TO THE AFFECTED AREA EVERY DAY, Disp: , Rfl: 0 .  EUCRISA 2 % OINT, APPLY TOPICALLY TO THE AFFECTED AREA EVERY NIGHT AT BEDTIME, Disp: , Rfl: 0 .  fluticasone (FLONASE) 50 MCG/ACT nasal spray, Place into the nose., Disp: , Rfl:  .  fluticasone (FLONASE) 50 MCG/ACT nasal spray, PLACE 2 SPRAYS INTO BOTH NOSTRILS . USE FOR 4 TO 6 WEEKS THEN STOP AND USE SEASONALLY OR AS NEEDED, Disp: 48 mL, Rfl: 1 .  pantoprazole (PROTONIX)  40 MG tablet, TAKE 1 TABLET BY MOUTH DAILY BEFORE BREAKFAST, Disp: 90 tablet, Rfl: 1 .  predniSONE (DELTASONE) 50 MG tablet, Take 1 tablet (50 mg total) by mouth daily with breakfast., Disp: 5 tablet, Rfl: 0 .  Tiotropium Bromide-Olodaterol 2.5-2.5 MCG/ACT AERS, Inhale into the lungs., Disp: , Rfl:  .  albuterol (VENTOLIN HFA) 108 (90 Base) MCG/ACT inhaler, Inhale 2 puffs into the lungs every 6 (six) hours as needed for wheezing or shortness of breath., Disp: 1 each, Rfl: 5  Depression screen [facility] 2/9 09/11/2020 04/11/2020 10/20/2019  Decreased Interest 0 0 3  Down, Depressed, Hopeless 0 0 1  PHQ - 2 Score 0 0 4  Altered sleeping 0 0 2  Tired, decreased energy 1 0 3  Change in appetite 0 0 0  Feeling bad or failure about yourself  0 0 0  Trouble concentrating 0 0 2  Moving slowly or fidgety/restless 0 0 0  Suicidal thoughts 0 0 0  PHQ-9 Score 1 0 11  Difficult doing work/chores - Not difficult at all Not difficult at all  Some recent data might be hidden    GAD 7 : Generalized Anxiety Score 09/11/2020 10/20/2019 07/21/2019 04/03/2019  Nervous, Anxious, on Edge 1 1 3  (No Data)  Control/stop worrying 0 3 2 -  Worry too much - different things 0 2 2 -  Trouble relaxing 1 1 1  -  Restless 1 0 0 -  Easily annoyed or irritable 1 1 3  -  Afraid - awful might happen 0 0 1 -  Total GAD 7 Score 4 8 12  -  Anxiety Difficulty - Not difficult at all Somewhat difficult -    -------------------------------------------------------------------------- O: No physical exam performed due to remote telephone encounter.  Lab results reviewed.  Recent Results (from the past 2160 hour(s))  Novel Coronavirus, NAA ([facility])     Status: None   Collection Time: 03/05/21 12:00 AM   Specimen: Nasopharyngeal(NP) swabs in vial transport medium  Result Value Ref Range   SARS-CoV-2, NAA POSITIVE     Comment: Home rapid test     -------------------------------------------------------------------------- A&P:  Problem List Items Addressed This Visit    OSA (obstructive sleep apnea)   Relevant Orders   Ambulatory referral for Covid Treatment   Coronary artery disease involving native coronary artery of native heart with angina pectoris ([place])   Relevant Orders   Ambulatory referral for Covid Treatment   Chronic cough   Relevant Medications   albuterol (VENTOLIN HFA) 108 (90 Base) MCG/ACT inhaler   Centrilobular emphysema ([facility])   Relevant Medications   chlorpheniramine-HYDROcodone (TUSSIONEX PENNKINETIC ER) 10-8 MG/5ML SUER   albuterol (VENTOLIN HFA) 108 (90 Base) MCG/ACT inhaler   predniSONE (DELTASONE) 50 MG tablet   Other Relevant Orders   Ambulatory referral for Covid Treatment    Other Visit Diagnoses    COVID-19 virus infection    -  Primary  Relevant Medications   chlorpheniramine-HYDROcodone (TUSSIONEX PENNKINETIC ER) 10-8 MG/5ML SUER   predniSONE (DELTASONE) 50 MG tablet   Other Relevant Orders   Ambulatory referral for Covid Treatment     Acute COVID19 Infection High risk complication with moderate disease, high risk of hospitalization - Centrilobular Emphysema, OSA, Coronary Artery Disease, Obesity  Moderna vaccine COVID, last dose 07/2020, did not receive booster. No IV treatments.  Symptoms started 03/04/21 Home rapid test POSITIVE 03/05/21 Sick contact with confirmed covid She is on quarantine now from work, can return 3/14/ on Monday if fever free and improved  Referral to [place] treatment team [facility], discussed may or may not have treatment available.  Start Tussionex cough syrup as prescribed for PRN relief Start Prednisone 50mg  daily taper w/ COPD risk of flare Albuterol refill today  Return criteria reviewed when to seek care at hospital if not improving.   Meds ordered this encounter  Medications  . chlorpheniramine-HYDROcodone (TUSSIONEX PENNKINETIC ER) 10-8  MG/5ML SUER    Sig: Take 5 mLs by mouth every 12 (twelve) hours as needed for cough.    Dispense:  115 mL    Refill:  0  . albuterol (VENTOLIN HFA) 108 (90 Base) MCG/ACT inhaler    Sig: Inhale 2 puffs into the lungs every 6 (six) hours as needed for wheezing or shortness of breath.    Dispense:  1 each    Refill:  5  . predniSONE (DELTASONE) 50 MG tablet    Sig: Take 1 tablet (50 mg total) by mouth daily with breakfast.    Dispense:  5 tablet    Refill:  0    Follow-up: - Return as needed if not improved  Patient verbalizes understanding with the above medical recommendations including the limitation of remote medical advice.  Specific follow-up and call-back criteria were given for patient to follow-up or seek medical care more urgently if needed.   - Time spent in direct consultation with patient on phone: 12 minutes   Nobie Putnam, [facility] 03/05/2021, 3:25 PM

## 2021-03-06 ENCOUNTER — Encounter: Payer: Self-pay | Admitting: Oncology

## 2021-03-06 ENCOUNTER — Other Ambulatory Visit: Payer: Self-pay | Admitting: Oncology

## 2021-03-06 MED ORDER — MOLNUPIRAVIR EUA 200MG CAPSULE: 4.0000 | ORAL_CAPSULE | Freq: Two times a day (BID) | ORAL | 0 refills | Status: AC

## 2021-03-06 MED ORDER — MOLNUPIRAVIR EUA 200MG CAPSULE: 4.0000 | ORAL_CAPSULE | Freq: Two times a day (BID) | ORAL | 0 refills | Status: DC

## 2021-03-06 NOTE — Progress Notes (Signed)
Outpatient Oral COVID Treatment Note  I connected with Angela Mccullough on 03/06/2021/10:09 AM by telephone and verified that I am speaking with the correct person using two identifiers.  I discussed the limitations, risks, security, and privacy concerns of performing an evaluation and management service by telephone and the availability of in person appointments. I also discussed with the patient that there may be a patient responsible charge related to this service. The patient expressed understanding and agreed to proceed.  Patient location: Home Provider location: Clinic   Diagnosis: COVID-19 infection  Purpose of visit: Discussion of potential use of Molnupiravir or Paxlovid, a new treatment for mild to moderate COVID-19 viral infection in non-hospitalized patients.   Subjective: Patient is a 55 y.o. female who has been diagnosed with COVID 19 viral infection.  Their symptoms began on 03/04/21 with cough and sob.  Past Medical History:  Diagnosis Date  . ASD (atrial septal defect), common atrium (single atrium)   . 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 Heart attack ([place]) 10/26/2017  . Heart murmur   . Kidney stone     Allergies  Allergen Reactions  . Biaxin [Clarithromycin] Nausea And Vomiting     Current Outpatient Medications:  .  molnupiravir EUA 200 mg CAPS, Take 4 capsules (800 mg total) by mouth 2 (two) times daily for 5 days., Disp: 40 capsule, Rfl: 0 .  albuterol (VENTOLIN HFA) 108 (90 Base) MCG/ACT inhaler, Inhale 2 puffs into the lungs every 6 (six) hours as needed for wheezing or shortness of breath., Disp: 1 each, Rfl: 5 .  ARIPiprazole (ABILIFY) 10 MG tablet, Take 1 tablet (10 mg total) by mouth daily., Disp: 90 tablet, Rfl: 1 .  aspirin 81 MG chewable tablet, Chew 1 tablet (81 mg total) by mouth daily., Disp: 30 tablet, Rfl: 1 .  atorvastatin (LIPITOR) 80 MG tablet, Take 80 mg by mouth daily., Disp: , Rfl:  .  buPROPion (WELLBUTRIN) 75 MG tablet, Take  75 mg by mouth daily., Disp: , Rfl:  .  cetirizine (ZYRTEC) 10 MG tablet, Take 10 mg by mouth daily. , Disp: , Rfl:  .  chlorpheniramine-HYDROcodone (TUSSIONEX PENNKINETIC ER) 10-8 MG/5ML SUER, Take 5 mLs by mouth every 12 (twelve) hours as needed for cough., Disp: 115 mL, Rfl: 0 .  citalopram (CELEXA) 40 MG tablet, Take 1 tablet (40 mg total) by mouth daily. Start with half tab daily for 2 weeks then increase to 1 full tab 40mg  daily only for initial rx., Disp: 90 tablet, Rfl: 1 .  clindamycin (CLEOCIN T) 1 % lotion, Apply to affected area once a day, Disp: 60 mL, Rfl: 0 .  diphenhydrAMINE (BENADRYL) 25 MG tablet, Take 25 mg by mouth every 4 (four) hours as needed., Disp: , Rfl:  .  ENSTILAR 0.005-0.064 % FOAM, APPLY TOPICALLY TO THE AFFECTED AREA EVERY DAY, Disp: , Rfl: 0 .  EUCRISA 2 % OINT, APPLY TOPICALLY TO THE AFFECTED AREA EVERY NIGHT AT BEDTIME, Disp: , Rfl: 0 .  fluticasone (FLONASE) 50 MCG/ACT nasal spray, Place into the nose., Disp: , Rfl:  .  fluticasone (FLONASE) 50 MCG/ACT nasal spray, PLACE 2 SPRAYS INTO BOTH NOSTRILS . USE FOR 4 TO 6 WEEKS THEN STOP AND USE SEASONALLY OR AS NEEDED, Disp: 48 mL, Rfl: 1 .  pantoprazole (PROTONIX) 40 MG tablet, TAKE 1 TABLET BY MOUTH DAILY BEFORE BREAKFAST, Disp: 90 tablet, Rfl: 1 .  predniSONE (DELTASONE) 50 MG tablet, Take 1 tablet (50 mg  total) by mouth daily with breakfast., Disp: 5 tablet, Rfl: 0 .  Tiotropium Bromide-Olodaterol 2.5-2.5 MCG/ACT AERS, Inhale into the lungs., Disp: , Rfl:   Objective: Patient sounds stable.  They are in no apparent distress.  Breathing is non labored.  Mood and behavior are normal.  Laboratory Data:  Recent Results (from the past 2160 hour(s))  Novel Coronavirus, NAA ([facility])     Status: None   Collection Time: 03/05/21 12:00 AM   Specimen: Nasopharyngeal(NP) swabs in vial transport medium  Result Value Ref Range   SARS-CoV-2, NAA POSITIVE     Comment: Home rapid test     Assessment: 55 y.o. female with  mild/moderate COVID 19 viral infection diagnosed on 03/05/21 at high risk for progression to severe COVID 19.  Plan:  This patient is a 55 y.o. female that meets the following criteria for Emergency Use Authorization of: Molnupiravir  1. Age >18 yr 2. SARS-COV-2 positive test 3. Symptom onset < 5 days 4. Mild-to-moderate COVID disease with high risk for severe progression to hospitalization or death   I have spoken and communicated the following to the patient or parent/caregiver regarding: 1. Molnupiravir is an unapproved drug that is authorized for use under an [employer].  2. There are no adequate, approved, available products for the treatment of COVID-19 in adults who have mild-to-moderate COVID-19 and are at high risk for progressing to severe COVID-19, including hospitalization or death. 3. Other therapeutics are currently authorized. For additional information on all products authorized for treatment or prevention of COVID-19, please see [email or web address].  4. There are benefits and risks of taking this treatment as outlined in the "Fact Sheet for Patients and Caregivers."  5. "Fact Sheet for Patients and Caregivers" was reviewed with patient. A hard copy will be provided to patient from pharmacy prior to the patient receiving treatment. 6. Patients should continue to self-isolate and use infection control measures (e.g., wear mask, isolate, social distance, avoid sharing personal items, clean and disinfect "high touch" surfaces, and frequent handwashing) according to CDC guidelines.  7. The patient or parent/caregiver has the option to accept or refuse treatment. 8. [place] has established a pregnancy surveillance program. 9. Females of childbearing potential should use a reliable method of contraception correctly and consistently, as applicable, for the  duration of treatment and for 4 days after the last dose of Molnupiravir. 23. Males of reproductive potential who are sexually active with females of childbearing potential should use a reliable method of contraception correctly and consistently during treatment and for at least 3 months after the last dose. 11. Pregnancy status and risk was assessed. Patient verbalized understanding of precautions.   After reviewing above information with the patient, the patient agrees to receive molnupiravir.  Follow up instructions:    . Take prescription BID x 5 days as directed . Reach out to pharmacist for counseling on medication if desired . For concerns regarding further COVID symptoms please follow up with your PCP or urgent care . For urgent or life-threatening issues, seek care at your local emergency department  The patient was provided an opportunity to ask questions, and all were answered. The patient agreed with the plan and demonstrated an understanding of the instructions.   Script to [facility] in [place].   The patient was advised to call their PCP or seek an in-person evaluation if the symptoms worsen or if the condition fails to improve as anticipated.   I provided 15 minutes of non  face-to-face telephone visit time during this encounter, and > 50% was spent counseling as documented under my assessment & plan.  Jacquelin Hawking, NP 03/06/2021 /10:09 AM

## 2021-03-10 ENCOUNTER — Ambulatory Visit (INDEPENDENT_AMBULATORY_CARE_PROVIDER_SITE_OTHER): Payer: BC Managed Care – PPO | Admitting: Family Medicine

## 2021-03-10 ENCOUNTER — Ambulatory Visit: Payer: Self-pay

## 2021-03-10 ENCOUNTER — Encounter: Payer: Self-pay | Admitting: Family Medicine

## 2021-03-10 ENCOUNTER — Other Ambulatory Visit: Payer: Self-pay

## 2021-03-10 VITALS — Ht 62.0 in | Wt 185.0 lb

## 2021-03-10 DIAGNOSIS — U071 COVID-19: Secondary | ICD-10-CM

## 2021-03-10 DIAGNOSIS — J432 Centrilobular emphysema: Secondary | ICD-10-CM | POA: Diagnosis not present

## 2021-03-10 NOTE — Patient Instructions (Addendum)
I have called [facility] she is a patient of [facility] and I have left a message for their nursing staff to reach patient today to follow-up if she can be seen by them or if they recommend hospital or other treatment.  Patient can notify us if needed, otherwise if worsening advised to go to hospital / urgent care.  Please schedule a Follow-up Appointment to: Return if symptoms worsen or fail to improve.  If you have any other questions or concerns, please feel free to call the office or send a message through [facility]. You may also schedule an earlier appointment if necessary.  Additionally, you may be receiving a survey about your experience at our office within a few days to 1 week by e-mail or mail. We value your feedback.  Nobie Putnam, DO [place]

## 2021-03-10 NOTE — Progress Notes (Signed)
Virtual Visit via Telephone The purpose of this virtual visit is to provide medical care while limiting exposure to the novel coronavirus (COVID19) for both patient and office staff.  Consent was obtained for phone visit:  Yes.   Answered questions that patient had about telehealth interaction:  Yes.   I discussed the limitations, risks, security and privacy concerns of performing an evaluation and management service by telephone. I also discussed with the patient that there may be a patient responsible charge related to this service. The patient expressed understanding and agreed to proceed.  Patient Location: Home Provider Location: [facility] (Office)  Participants in virtual visit: - Patient: Angela Mccullough. Angela Mccullough - CMA: Orinda Kenner, CMA - Provider: Dr Parks Ranger  ---------------------------------------------------------------------- Chief Complaint  Patient presents with  . Cough  . Shortness of Breath  . Weakness    S: Reviewed CMA documentation. I have called patient and gathered additional HPI as follows:  COVID19 Positive Centrilobular Emphysema OSA Coronary Artery Disease  Last visit 03/05/21 with me telemedicine visit for COVID19 symptoms started 03/04/21 with cough, congestion, headache, her coworker tested positive for COVID and she tested today with rapid home test and it was POSITIVE today on 03/05/21. She was treated with cough syrup tussionex, prednisone burst, using breathing treatments, also referred for COVID treatment through [facility]. On 03/06/21 she was contacted and given rx Molnupiravir antiviral for [place].  Today she has scheduled again to follow-up with worsening symptoms, not improvement. Increasing cough, dyspnea, hoarse voice. Limited improvement so far on current treatment  Also followed by [facility], however last apt 11/2020 has not seen them recently.  Denies any known or suspected exposure to person with  or possibly with COVID19.  Denies any fevers, chills, sweats, body ache, sinus pain or pressure, headache, abdominal pain, diarrhea  Past Medical History:  Diagnosis Date  . ASD (atrial septal defect), common atrium (single atrium)   . 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 Heart attack ([place]) 10/26/2017  . Heart murmur   . Kidney stone    Social History   Tobacco Use  . Smoking status: Current Every Day Smoker    Packs/day: 0.25    Types: Cigarettes  . Smokeless tobacco: Current User  . Tobacco comment: down to 4 cigs per day  Vaping Use  . Vaping Use: Never used  Substance Use Topics  . Alcohol use: Yes    Alcohol/week: 0.0 standard drinks    Comment: 4 yrs sober, very little  . Drug use: Not Currently    Types: Marijuana    Comment: Smoked once early today.     Current Outpatient Medications:  .  albuterol (VENTOLIN HFA) 108 (90 Base) MCG/ACT inhaler, Inhale 2 puffs into the lungs every 6 (six) hours as needed for wheezing or shortness of breath., Disp: 1 each, Rfl: 5 .  ARIPiprazole (ABILIFY) 10 MG tablet, Take 1 tablet (10 mg total) by mouth daily., Disp: 90 tablet, Rfl: 1 .  aspirin 81 MG chewable tablet, Chew 1 tablet (81 mg total) by mouth daily., Disp: 30 tablet, Rfl: 1 .  atorvastatin (LIPITOR) 80 MG tablet, Take 80 mg by mouth daily., Disp: , Rfl:  .  buPROPion (WELLBUTRIN) 75 MG tablet, Take 75 mg by mouth daily., Disp: , Rfl:  .  cetirizine (ZYRTEC) 10 MG tablet, Take 10 mg by mouth daily. , Disp: , Rfl:  .  chlorpheniramine-HYDROcodone (TUSSIONEX PENNKINETIC ER) 10-8 MG/5ML SUER,  Take 5 mLs by mouth every 12 (twelve) hours as needed for cough., Disp: 115 mL, Rfl: 0 .  citalopram (CELEXA) 40 MG tablet, Take 1 tablet (40 mg total) by mouth daily. Start with half tab daily for 2 weeks then increase to 1 full tab 40mg  daily only for initial rx., Disp: 90 tablet, Rfl: 1 .  clindamycin (CLEOCIN T) 1 % lotion, Apply to affected area once a day, Disp: 60 mL,  Rfl: 0 .  diphenhydrAMINE (BENADRYL) 25 MG tablet, Take 25 mg by mouth every 4 (four) hours as needed., Disp: , Rfl:  .  ENSTILAR 0.005-0.064 % FOAM, APPLY TOPICALLY TO THE AFFECTED AREA EVERY DAY, Disp: , Rfl: 0 .  EUCRISA 2 % OINT, APPLY TOPICALLY TO THE AFFECTED AREA EVERY NIGHT AT BEDTIME, Disp: , Rfl: 0 .  fluticasone (FLONASE) 50 MCG/ACT nasal spray, Place into the nose., Disp: , Rfl:  .  fluticasone (FLONASE) 50 MCG/ACT nasal spray, PLACE 2 SPRAYS INTO BOTH NOSTRILS . USE FOR 4 TO 6 WEEKS THEN STOP AND USE SEASONALLY OR AS NEEDED, Disp: 48 mL, Rfl: 1 .  molnupiravir EUA 200 mg CAPS, Take 4 capsules (800 mg total) by mouth 2 (two) times daily for 5 days., Disp: 40 capsule, Rfl: 0 .  pantoprazole (PROTONIX) 40 MG tablet, TAKE 1 TABLET BY MOUTH DAILY BEFORE BREAKFAST, Disp: 90 tablet, Rfl: 1 .  predniSONE (DELTASONE) 50 MG tablet, Take 1 tablet (50 mg total) by mouth daily with breakfast., Disp: 5 tablet, Rfl: 0 .  Tiotropium Bromide-Olodaterol 2.5-2.5 MCG/ACT AERS, Inhale into the lungs., Disp: , Rfl:   Depression screen [facility] 2/9 09/11/2020 04/11/2020 10/20/2019  Decreased Interest 0 0 3  Down, Depressed, Hopeless 0 0 1  PHQ - 2 Score 0 0 4  Altered sleeping 0 0 2  Tired, decreased energy 1 0 3  Change in appetite 0 0 0  Feeling bad or failure about yourself  0 0 0  Trouble concentrating 0 0 2  Moving slowly or fidgety/restless 0 0 0  Suicidal thoughts 0 0 0  PHQ-9 Score 1 0 11  Difficult doing work/chores - Not difficult at all Not difficult at all  Some recent data might be hidden    GAD 7 : Generalized Anxiety Score 09/11/2020 10/20/2019 07/21/2019 04/03/2019  Nervous, Anxious, on Edge 1 1 3  (No Data)  Control/stop worrying 0 3 2 -  Worry too much - different things 0 2 2 -  Trouble relaxing 1 1 1  -  Restless 1 0 0 -  Easily annoyed or irritable 1 1 3  -  Afraid - awful might happen 0 0 1 -  Total GAD 7 Score 4 8 12  -  Anxiety Difficulty - Not difficult at all Somewhat difficult -     -------------------------------------------------------------------------- O: No physical exam performed due to remote telephone encounter.  Lab results reviewed.  Recent Results (from the past 2160 hour(s))  Novel Coronavirus, NAA ([facility])     Status: None   Collection Time: 03/05/21 12:00 AM   Specimen: Nasopharyngeal(NP) swabs in vial transport medium  Result Value Ref Range   SARS-CoV-2, NAA POSITIVE     Comment: Home rapid test    -------------------------------------------------------------------------- A&P:  Problem List Items Addressed This Visit    Centrilobular emphysema ([place])    Other Visit Diagnoses    COVID-19 virus infection    -  Primary     Clinically with [id number] infection Complication with COPD, OSA, CAD High risk complication with moderate  disease, high risk of hospitalization  Moderna vaccine COVID, last dose 07/2020, did not receive booster.  Symptoms started 03/04/21 Home rapid test POSITIVE 03/05/21 Sick contact with confirmed covid Remains on quarantine from work S/p COVID19 anti viral treatment on 03/06/21 from [place] course Completed Prednisone, cough syrup, limited results overall  Given worsening still, I advised patient she would likely need imaging and may warrant in person evaluation maybe at Pulmonology vs Urgent Care / ED hospital, may need CT imaging given severity and could benefit from checking oxygen and may warrant stronger treatment options for symptom management.  Return criteria reviewed when to seek care at hospital if not improving  I have called [facility] she is a patient of [facility] and I have left a message for their nursing staff to reach patient today to follow-up if she can be seen by them or if they recommend hospital or other treatment.  Patient can notify us if needed, otherwise if worsening advised to go to hospital / urgent care.  No orders of the defined types were  placed in this encounter.   Follow-up: PRN  Patient verbalizes understanding with the above medical recommendations including the limitation of remote medical advice.  Specific follow-up and call-back criteria were given for patient to follow-up or seek medical care more urgently if needed.   - Time spent in direct consultation with patient on phone: 11 minutes   Nobie Putnam, [facility] 03/10/2021, 9:59 AM

## 2021-03-10 NOTE — Telephone Encounter (Signed)
Pt. Diagnosed with COVID 19 last week. Treated with anti-viral. Has continued cough, shortness of breath and weakness.Voice is hoarse from coughing. No fever. Virtual visit made for today.  Reason for Disposition . [1] MILD difficulty breathing (e.g., minimal/no SOB at rest, SOB with walking, pulse <100) AND [2] new-onset  Answer Assessment - Initial Assessment Questions 1. COVID-19 ONSET: "When did the symptoms of COVID-19 first start?"     Last week 2. DIAGNOSIS CONFIRMATION: "How were you diagnosed?" (e.g., COVID-19 oral or nasal viral test; COVID-19 antibody test; doctor visit)     Yes 3. MAIN SYMPTOM:  "What is your main concern or symptom right now?" (e.g., breathing difficulty, cough, fatigue. loss of smell)     Cough, weak 4. SYMPTOM ONSET: "When did the  Symptoms  start?"     Last week 5. BETTER-SAME-WORSE: "Are you getting better, staying the same, or getting worse over the last 1 to 2 weeks?"     Worse 6. RECENT MEDICAL VISIT: "Have you been seen by a healthcare provider (doctor, NP, PA) for these persisting COVID-19 symptoms?" If Yes, ask: "When were you seen?" (e.g., date)     Yes 7. COUGH: "Do you have a cough?" If Yes, ask: "How bad is the cough?"       Yes 8. FEVER: "Do you have a fever?" If Yes, ask: "What is your temperature, how was it measured, and when did it start?"     No 9. BREATHING DIFFICULTY: "Are you having any trouble breathing?" If Yes, ask: "How bad is your breathing?" (e.g., mild, moderate, severe)    - MILD: No SOB at rest, mild SOB with walking, speaks normally in sentences, can lie down, no retractions, pulse < 100.    - MODERATE: SOB at rest, SOB with minimal exertion and prefers to sit, cannot lie down flat, speaks in phrases, mild retractions, audible wheezing, pulse 100-120.    - SEVERE: Very SOB at rest, speaks in single words, struggling to breathe, sitting hunched forward, retractions, pulse > 120       Mild 10. HIGH RISK DISEASE: "Do you have any  chronic medical problems?" (e.g., asthma, heart or lung disease, weak immune system, obesity, etc.)       COPD, Heart attack 11. VACCINE: "Have you gotten the COVID-19 vaccine?" If Yes, ask: "Which one, how many shots, when did you get it?"       Yes 12. BOOSTER: "Have you received your COVID-19 booster?" If Yes, ask: "Which one and when did you get it?"       No 13. PREGNANCY: "Is there any chance you are pregnant?" "When was your last menstrual period?"       No 14. OTHER SYMPTOMS: "Do you have any other symptoms?"  (e.g., fatigue, headache, muscle pain, weakness)       Fatigue 15. O2 SATURATION MONITOR:  "Do you use an oxygen saturation monitor (pulse oximeter) at home?" If Yes, ask "What is your reading (oxygen level) today?" "What is your usual oxygen saturation reading?" (e.g., 95%)       No  Protocols used: CORONAVIRUS (COVID-19) PERSISTING SYMPTOMS FOLLOW-UP CALL-A-AH

## 2021-04-21 ENCOUNTER — Emergency Department
Admission: EM | Admit: 2021-04-21 | Discharge: 2021-04-21 | Disposition: A | Discharge status: Home / Self Care | Payer: BC Managed Care – PPO | Attending: Emergency Medicine | Admitting: Emergency Medicine

## 2021-04-21 ENCOUNTER — Other Ambulatory Visit: Payer: Self-pay

## 2021-04-21 DIAGNOSIS — S40011A Contusion of right shoulder, initial encounter: Secondary | ICD-10-CM | POA: Insufficient documentation

## 2021-04-21 DIAGNOSIS — I25111 Atherosclerotic heart disease of native coronary artery with angina pectoris with documented spasm: Secondary | ICD-10-CM | POA: Diagnosis not present

## 2021-04-21 DIAGNOSIS — S2020XA Contusion of thorax, unspecified, initial encounter: Secondary | ICD-10-CM | POA: Insufficient documentation

## 2021-04-21 DIAGNOSIS — T07XXXA Unspecified multiple injuries, initial encounter: Secondary | ICD-10-CM

## 2021-04-21 DIAGNOSIS — S4991XA Unspecified injury of right shoulder and upper arm, initial encounter: Secondary | ICD-10-CM | POA: Diagnosis present

## 2021-04-21 DIAGNOSIS — S40012A Contusion of left shoulder, initial encounter: Secondary | ICD-10-CM | POA: Insufficient documentation

## 2021-04-21 DIAGNOSIS — T71193A Asphyxiation due to mechanical threat to breathing due to other causes, assault, initial encounter: Secondary | ICD-10-CM | POA: Diagnosis not present

## 2021-04-21 DIAGNOSIS — Z7982 Long term (current) use of aspirin: Secondary | ICD-10-CM | POA: Diagnosis not present

## 2021-04-21 DIAGNOSIS — F17210 Nicotine dependence, cigarettes, uncomplicated: Secondary | ICD-10-CM | POA: Insufficient documentation
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
## 2021-04-21 NOTE — Discharge Instructions (Addendum)
Follow-up with your regular doctor as needed.  Return emergency department worsening.  Take Tylenol or ibuprofen for pain as needed.  Apply ice to all areas that hurt
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
## 2021-05-16 ENCOUNTER — Other Ambulatory Visit: Payer: Self-pay | Admitting: Family Medicine

## 2021-05-16 DIAGNOSIS — F4310 Post-traumatic stress disorder, unspecified: Secondary | ICD-10-CM

## 2021-07-01 IMAGING — MG DIGITAL SCREENING BILATERAL MAMMOGRAM WITH TOMO AND CAD
8 series · 8 of 24 positions shown · non-contrast
Comparison: Previous exam(s).

ACR Breast Density Category a: The breast tissue is almost entirely
fatty.

CLINICAL DATA: Screening.

EXAM:
DIGITAL SCREENING BILATERAL MAMMOGRAM WITH TOMO AND CAD

[R MLO synth-2D]
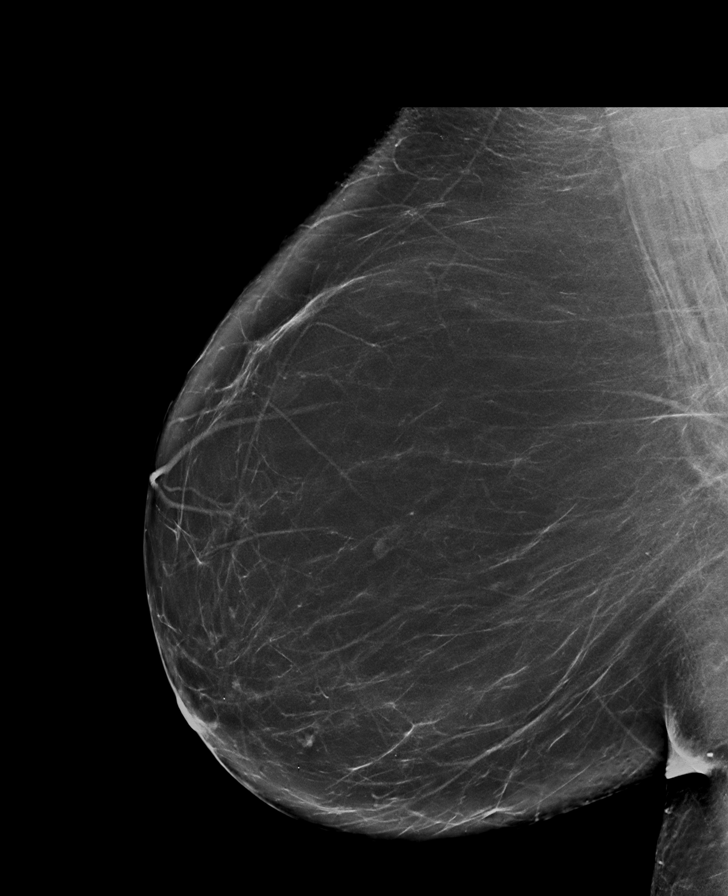

[L MLO synth-2D]
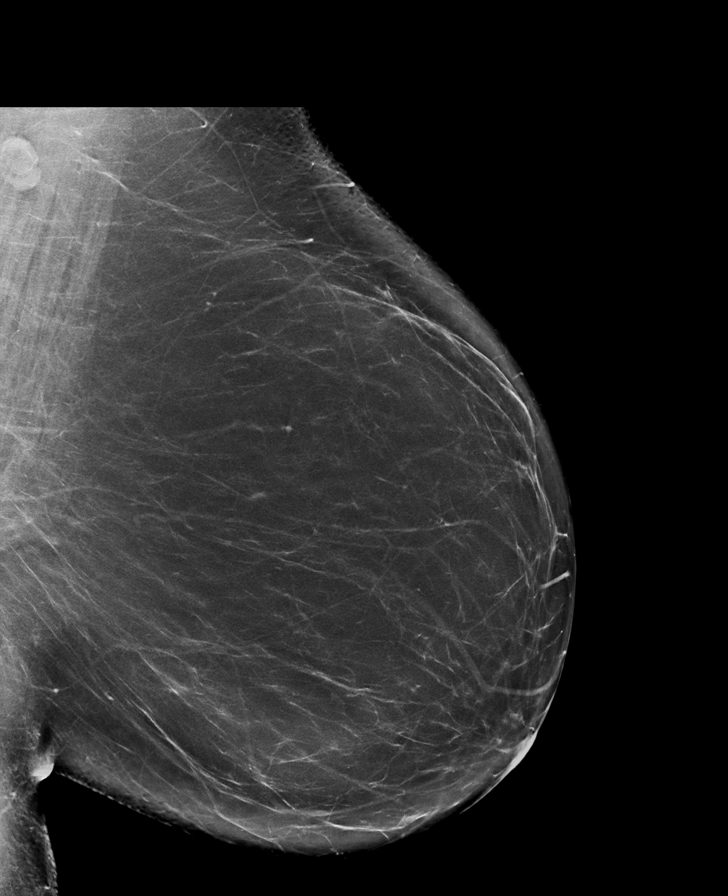

[R CC synth-2D]
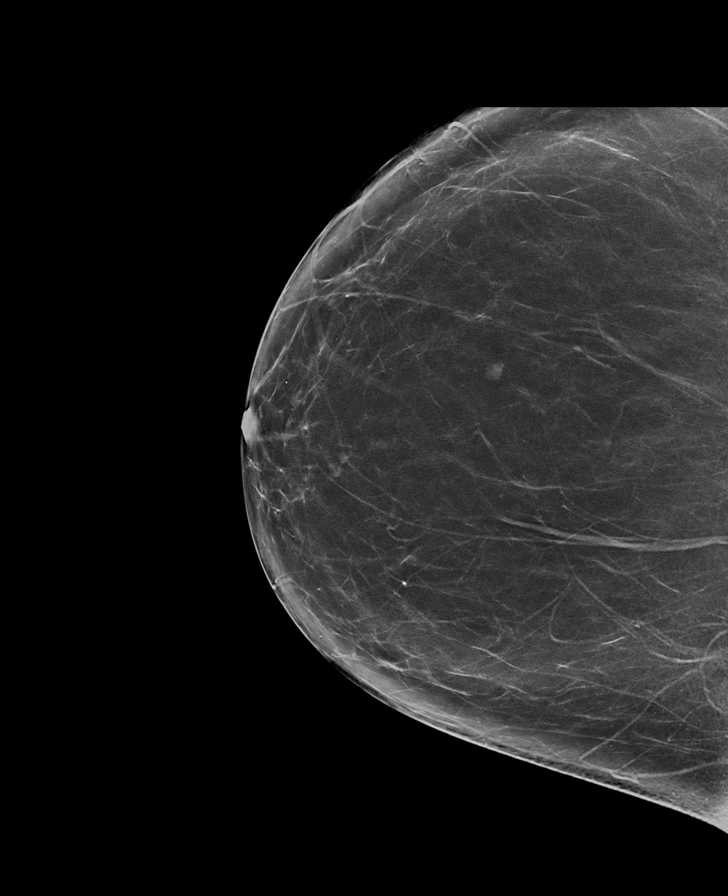

[L CC synth-2D]
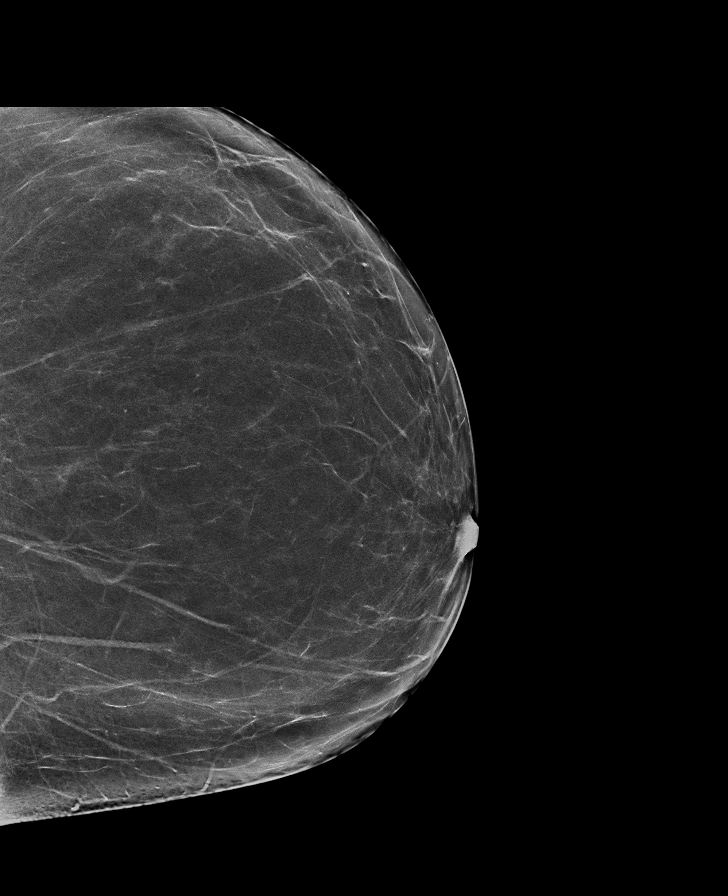

[L CC tomo · tomo slice 40/79.0]
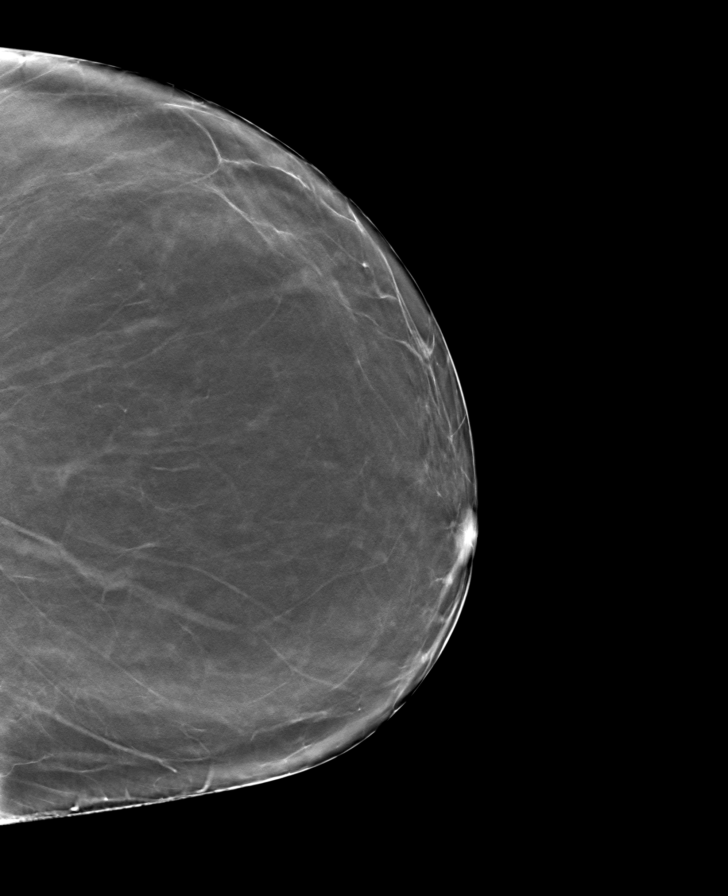

[L MLO tomo · tomo slice 49/96.0]
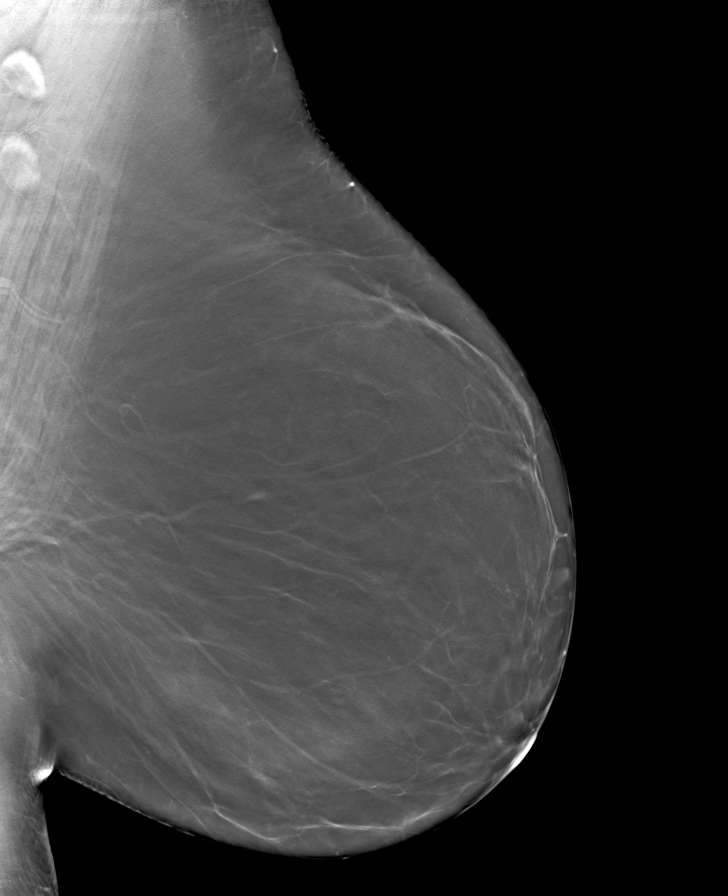

[R CC tomo · tomo slice 39/78.0]
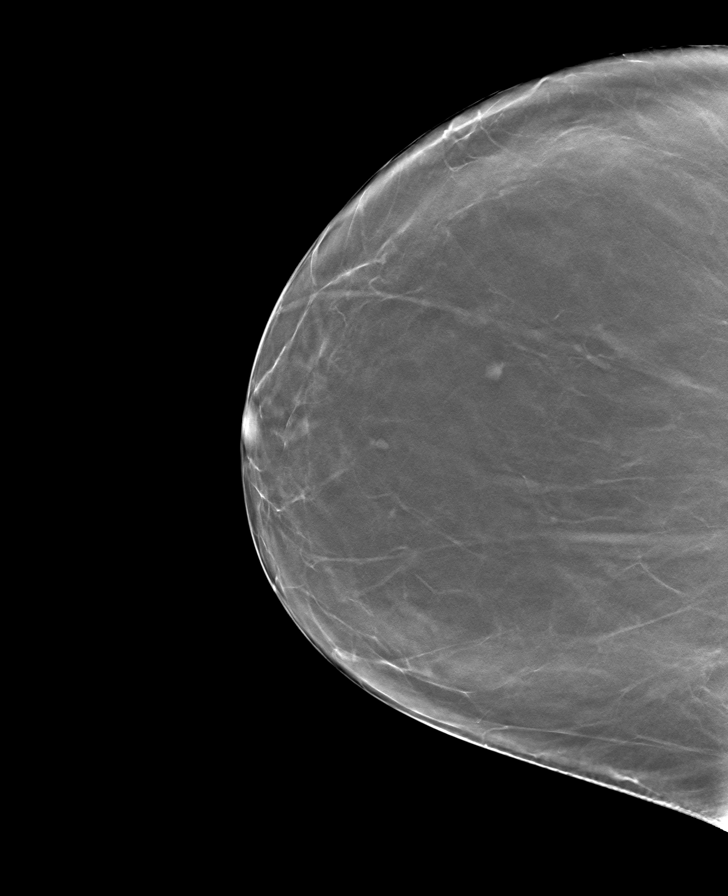

[R MLO tomo · tomo slice 47/93.0]
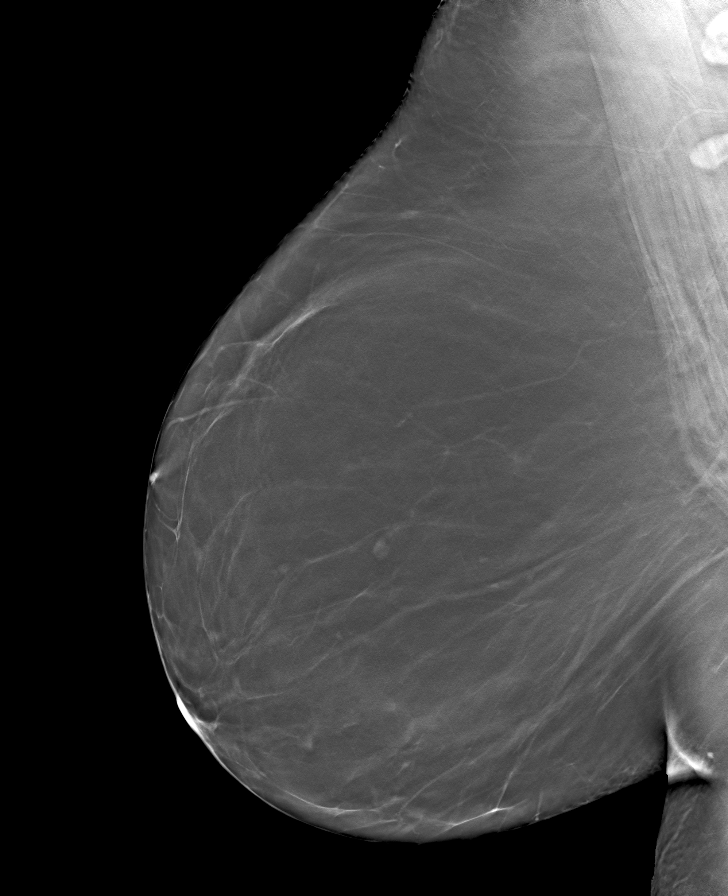

[8 of 24 positions shown; findings below may reference images not displayed]

FINDINGS: There are no findings suspicious for malignancy. Images were
processed with CAD.
IMPRESSION: No mammographic evidence of malignancy. A result letter of this
screening mammogram will be mailed directly to the patient.

RECOMMENDATION:
Screening mammogram in one year. (Code:[id number])

BI-RADS CATEGORY  1: Negative.

## 2021-07-22 ENCOUNTER — Other Ambulatory Visit: Payer: Self-pay | Admitting: Family Medicine

## 2021-07-22 DIAGNOSIS — H6981 Other specified disorders of Eustachian tube, right ear: Secondary | ICD-10-CM

## 2021-07-22 NOTE — Telephone Encounter (Signed)
Requested Prescriptions  Pending Prescriptions Disp Refills  . fluticasone (FLONASE) 50 MCG/ACT nasal spray [Pharmacy Med Name: FLUTICASONE PROP 50 MCG SPRAY] 48 mL 0    Sig: PLACE 2 SPRAYS INTO BOTH NOSTRILS . USE FOR 4 TO 6 WEEKS THEN STOP AND USE SEASONALLY OR AS NEEDED     Ear, Nose, and Throat: Nasal Preparations - Corticosteroids Passed - 07/22/2021  1:39 AM      Passed - Valid encounter within last 12 months    Recent Outpatient Visits          4 months ago COVID-19 virus infection   [place], DO   4 months ago COVID-19 virus infection   [place], DO   11 months ago Dysfunction of right eustachian tube   [place], DO   1 year ago Acute otitis media, unspecified otitis media type   [place], DO   1 year ago PTSD (post-traumatic stress disorder)   [facility], Devonne Doughty, DO

## 2021-09-19 ENCOUNTER — Other Ambulatory Visit: Payer: Self-pay

## 2021-09-19 DIAGNOSIS — H6981 Other specified disorders of Eustachian tube, right ear: Secondary | ICD-10-CM

## 2021-09-19 DIAGNOSIS — F4310 Post-traumatic stress disorder, unspecified: Secondary | ICD-10-CM

## 2021-09-19 MED ORDER — FLUTICASONE PROPIONATE 50 MCG/ACT NA SUSP: NASAL | 0 refills | Status: AC

## 2021-09-19 MED ORDER — CITALOPRAM HYDROBROMIDE 40 MG PO TABS: 40.0000 mg | ORAL_TABLET | Freq: Every day | ORAL | 1 refills | Status: AC

## 2021-12-04 ENCOUNTER — Ambulatory Visit: Payer: Self-pay

## 2021-12-04 NOTE — Telephone Encounter (Signed)
Pt. States she started coughing yesterday. States she woke up at 3:00 this morning "coughing my head off." States has runny nose, sore throat. History of asthma and has been using her Albuterol inhaler. No availability in the practice. Pt. Will try My Chart e-visit.   Reason for Disposition  [1] MILD difficulty breathing (e.g., minimal/no SOB at rest, SOB with walking, pulse <100) AND [2] still present when not coughing  Answer Assessment - Initial Assessment Questions 1. ONSET: "When did the cough begin?"      Yesterday 2. SEVERITY: "How bad is the cough today?"      Severe 3. SPUTUM: "Describe the color of your sputum" (none, dry cough; clear, white, yellow, green)     Yellow 4. HEMOPTYSIS: "Are you coughing up any blood?" If so ask: "How much?" (flecks, streaks, tablespoons, etc.)     No 5. DIFFICULTY BREATHING: "Are you having difficulty breathing?" If Yes, ask: "How bad is it?" (e.g., mild, moderate, severe)    - MILD: No SOB at rest, mild SOB with walking, speaks normally in sentences, can lie down, no retractions, pulse < 100.    - MODERATE: SOB at rest, SOB with minimal exertion and prefers to sit, cannot lie down flat, speaks in phrases, mild retractions, audible wheezing, pulse 100-120.    - SEVERE: Very SOB at rest, speaks in single words, struggling to breathe, sitting hunched forward, retractions, pulse > 120      Mild 6. FEVER: "Do you have a fever?" If Yes, ask: "What is your temperature, how was it measured, and when did it start?"     No 7. CARDIAC HISTORY: "Do you have any history of heart disease?" (e.g., heart attack, congestive heart failure)      No 8. LUNG HISTORY: "Do you have any history of lung disease?"  (e.g., pulmonary embolus, asthma, emphysema)     Asthma 9. PE RISK FACTORS: "Do you have a history of blood clots?" (or: recent major surgery, recent prolonged travel, bedridden)     No 10. OTHER SYMPTOMS: "Do you have any other symptoms?" (e.g., runny nose,  wheezing, chest pain)       Runny nose, sore throat 11. PREGNANCY: "Is there any chance you are pregnant?" "When was your last menstrual period?"       No 12. TRAVEL: "Have you traveled out of the country in the last month?" (e.g., travel history, exposures)       No  Protocols used: Cough - Acute Productive-A-AH

## 2022-01-28 ENCOUNTER — Telehealth: Payer: Self-pay

## 2022-01-28 ENCOUNTER — Other Ambulatory Visit: Payer: Self-pay

## 2022-01-28 DIAGNOSIS — Z86010 Personal history of colonic polyps: Secondary | ICD-10-CM

## 2022-01-28 MED ORDER — NA SULFATE-K SULFATE-MG SULF 17.5-3.13-1.6 GM/177ML PO SOLN: 354.0000 mL | Freq: Once | ORAL | 0 refills | Status: AC

## 2022-01-28 NOTE — Telephone Encounter (Signed)
Called patient got patient scheduled for 02/27/22 with Dr. Marius Ditch went over instructions sent to [facility] and mailed. Sent prep to pharmacy. Has still same insurance

## 2022-01-28 NOTE — Telephone Encounter (Signed)
Patient called and left a voicemail stating that she needed to schedule her colonoscopy as she was told that she needed to repeat it in three years. Patient's last colonoscopy with Dr. Marius Ditch was on 10/17/2018.

## 2022-02-27 ENCOUNTER — Encounter
Admission: RE | Disposition: A | Discharge status: Home / Self Care | Payer: Self-pay | Source: Home / Self Care | Attending: Gastroenterology

## 2022-02-27 ENCOUNTER — Ambulatory Visit: Payer: BC Managed Care – PPO | Admitting: Anesthesiology

## 2022-02-27 ENCOUNTER — Ambulatory Visit
Admission: RE | Admit: 2022-02-27 | Discharge: 2022-02-27 | Disposition: A | Discharge status: Home / Self Care | Payer: BC Managed Care – PPO | Attending: Gastroenterology | Admitting: Gastroenterology

## 2022-02-27 ENCOUNTER — Encounter: Payer: Self-pay | Admitting: Gastroenterology

## 2022-02-27 DIAGNOSIS — K219 Gastro-esophageal reflux disease without esophagitis: Secondary | ICD-10-CM | POA: Diagnosis not present

## 2022-02-27 DIAGNOSIS — Z9861 Coronary angioplasty status: Secondary | ICD-10-CM | POA: Insufficient documentation

## 2022-02-27 DIAGNOSIS — Z860100 Personal history of colon polyps, unspecified: Secondary | ICD-10-CM

## 2022-02-27 DIAGNOSIS — J432 Centrilobular emphysema: Secondary | ICD-10-CM | POA: Insufficient documentation

## 2022-02-27 DIAGNOSIS — F17290 Nicotine dependence, other tobacco product, uncomplicated: Secondary | ICD-10-CM | POA: Diagnosis not present

## 2022-02-27 DIAGNOSIS — F17210 Nicotine dependence, cigarettes, uncomplicated: Secondary | ICD-10-CM | POA: Diagnosis not present

## 2022-02-27 DIAGNOSIS — G4730 Sleep apnea, unspecified: Secondary | ICD-10-CM | POA: Diagnosis not present

## 2022-02-27 DIAGNOSIS — K635 Polyp of colon: Secondary | ICD-10-CM

## 2022-02-27 DIAGNOSIS — Z09 Encounter for follow-up examination after completed treatment for conditions other than malignant neoplasm: Secondary | ICD-10-CM | POA: Diagnosis present

## 2022-02-27 DIAGNOSIS — I252 Old myocardial infarction: Secondary | ICD-10-CM | POA: Diagnosis not present

## 2022-02-27 DIAGNOSIS — I2510 Atherosclerotic heart disease of native coronary artery without angina pectoris: Secondary | ICD-10-CM | POA: Diagnosis not present

## 2022-02-27 HISTORY — PX: COLONOSCOPY WITH PROPOFOL: SHX5780

## 2022-02-27 SURGERY — COLONOSCOPY WITH PROPOFOL
Anesthesia: General

## 2022-02-27 MED ORDER — PROPOFOL 500 MG/50ML IV EMUL
INTRAVENOUS | Status: AC
  Filled 2022-02-27: qty 50

## 2022-02-27 MED ORDER — PROPOFOL 500 MG/50ML IV EMUL
INTRAVENOUS | Status: DC | PRN
  Administered 2022-02-27: 150 ug/kg/min via INTRAVENOUS

## 2022-02-27 MED ORDER — SIMETHICONE 40 MG/0.6ML PO SUSP
ORAL | Status: DC | PRN
  Administered 2022-02-27: 120 mL

## 2022-02-27 MED ORDER — SODIUM CHLORIDE 0.9 % IV SOLN: INTRAVENOUS | Status: DC

## 2022-02-27 MED ORDER — LIDOCAINE HCL (CARDIAC) PF 100 MG/5ML IV SOSY
PREFILLED_SYRINGE | INTRAVENOUS | Status: DC | PRN
  Administered 2022-02-27: 40 mg via INTRAVENOUS

## 2022-02-27 MED ORDER — PROPOFOL 10 MG/ML IV BOLUS
INTRAVENOUS | Status: DC | PRN
  Administered 2022-02-27: 80 mg via INTRAVENOUS

## 2022-02-27 NOTE — Transfer of Care (Signed)
Immediate Anesthesia Transfer of Care Note ? ?Patient: Angela Mccullough ? ?Procedure(s) Performed: Procedure(s): ?COLONOSCOPY WITH PROPOFOL (N/A) ? ?Patient Location: PACU and Endoscopy Unit ? ?Anesthesia Type:General ? ?Level of Consciousness: sedated ? ?Airway & Oxygen Therapy: Patient Spontanous Breathing and Patient connected to nasal cannula oxygen ? ?Post-op Assessment: Report given to RN and Post -op Vital signs reviewed and stable ? ?Post vital signs: Reviewed and stable ? ?Last Vitals:  ?Vitals:  ? 02/27/22 1030 02/27/22 1032  ?BP: 110/69 110/69  ?Pulse: 96 (!) 101  ?Resp: (!) 24 19  ?Temp: 36.4 ?C   ?SpO2: 100% 98%  ? ? ?Complications: No apparent anesthesia complications ?

## 2022-02-27 NOTE — Anesthesia Procedure Notes (Signed)
Date/Time: 02/27/2022 10:00 AM ?Performed by: Doreen Salvage, CRNA ?Pre-anesthesia Checklist: Patient identified, Emergency Drugs available, Suction available and Patient being monitored ?Patient Re-evaluated:Patient Re-evaluated prior to induction ?Oxygen Delivery Method: Nasal cannula ?Induction Type: IV induction ?Dental Injury: Teeth and Oropharynx as per pre-operative assessment  ?Comments: Nasal cannula with etCO2 monitoring ? ? ? ? ?

## 2022-02-27 NOTE — Anesthesia Postprocedure Evaluation (Signed)
Anesthesia Post Note ? ?Patient: Angela Mccullough ? ?Procedure(s) Performed: COLONOSCOPY WITH PROPOFOL ? ?Patient location during evaluation: Endoscopy ?Anesthesia Type: General ?Level of consciousness: awake and alert ?Pain management: pain level controlled ?Vital Signs Assessment: post-procedure vital signs reviewed and stable ?Respiratory status: spontaneous breathing, nonlabored ventilation and respiratory function stable ?Cardiovascular status: blood pressure returned to baseline and stable ?Postop Assessment: no apparent nausea or vomiting ?Anesthetic complications: no ? ? ?No notable events documented. ? ? ?Last Vitals:  ?Vitals:  ? 02/27/22 1050 02/27/22 1100  ?BP: 128/72   ?Pulse: 78 77  ?Resp: 18 19  ?Temp:    ?SpO2: 100% 99%  ?  ?Last Pain:  ?Vitals:  ? 02/27/22 1030  ?TempSrc: Temporal  ?PainSc:   ? ? ?  ?  ?  ?  ?  ?  ? ?[place] Ouch ? ? ? ? ?

## 2022-02-27 NOTE — Op Note (Signed)
[facility] ?Gastroenterology ?Patient Name: Angela Mccullough ?Procedure Date: 02/27/2022 10:04 AM ?MRN: [id number] ?Account #: [id number] ?Date of Birth: 11-19-1966 ?Admit Type: Outpatient ?Age: 56 ?Room: [facility] ENDO ROOM 2 ?Gender: Female ?Note Status: Finalized ?Instrument Name: Colonscope [id number] ?Procedure:             Colonoscopy ?Indications:           Surveillance: Personal history of adenomatous polyps  ?                       on last colonoscopy 3 years ago, Last colonoscopy:  ?                       October 2019 ?Providers:             Lin Landsman MD, MD ?Referring MD:          Olin Hauser (Referring MD) ?Medicines:             General Anesthesia ?Complications:         No immediate complications. Estimated blood loss: None. ?Procedure:             Pre-Anesthesia Assessment: ?                       - Prior to the procedure, a History and Physical was  ?                       performed, and patient medications and allergies were  ?                       reviewed. The patient is competent. The risks and  ?                       benefits of the procedure and the sedation options and  ?                       risks were discussed with the patient. All questions  ?                       were answered and informed consent was obtained.  ?                       Patient identification and proposed procedure were  ?                       verified by the physician, the nurse, the  ?                       anesthesiologist, the anesthetist and the technician  ?                       in the pre-procedure area in the procedure room in the  ?                       endoscopy suite. Mental Status Examination: alert and  ?                       oriented. Airway Examination: normal oropharyngeal  ?  airway and neck mobility. Respiratory Examination:  ?                       clear to auscultation. CV Examination: normal.  ?                       Prophylactic Antibiotics:  The patient does not require  ?                       prophylactic antibiotics. Prior Anticoagulants: The  ?                       patient has taken no previous anticoagulant or  ?                       antiplatelet agents. ASA Grade Assessment: III - A  ?                       patient with severe systemic disease. After reviewing  ?                       the risks and benefits, the patient was deemed in  ?                       satisfactory condition to undergo the procedure. The  ?                       anesthesia plan was to use general anesthesia.  ?                       Immediately prior to administration of medications,  ?                       the patient was re-assessed for adequacy to receive  ?                       sedatives. The heart rate, respiratory rate, oxygen  ?                       saturations, blood pressure, adequacy of pulmonary  ?                       ventilation, and response to care were monitored  ?                       throughout the procedure. The physical status of the  ?                       patient was re-assessed after the procedure. ?                       After obtaining informed consent, the colonoscope was  ?                       passed under direct vision. Throughout the procedure,  ?                       the patient's blood pressure, pulse, and oxygen  ?  saturations were monitored continuously. The  ?                       Colonoscope was introduced through the anus and  ?                       advanced to the the cecum, identified by appendiceal  ?                       orifice and ileocecal valve. The colonoscopy was  ?                       performed without difficulty. The patient tolerated  ?                       the procedure well. The quality of the bowel  ?                       preparation was evaluated using the BBPS [facility] Bowel  ?                       Preparation Scale) with scores of: Right Colon = 3,  ?                        Transverse Colon = 3 and Left Colon = 3 (entire mucosa  ?                       seen well with no residual staining, small fragments  ?                       of stool or opaque liquid). The total BBPS score  ?                       equals 9. ?Findings: ?     The perianal and digital rectal examinations were normal. Pertinent  ?     negatives include normal sphincter tone and no palpable rectal lesions. ?     A 3 mm polyp was found in the recto-sigmoid colon. The polyp was  ?     sessile. The polyp was removed with a cold snare. Resection and  ?     retrieval were complete. Estimated blood loss: none. ?     The retroflexed view of the distal rectum and anal verge was normal and  ?     showed no anal or rectal abnormalities. ?     The terminal ileum appeared normal. ?Impression:            - One 3 mm polyp at the recto-sigmoid colon, removed  ?                       with a cold snare. Resected and retrieved. ?                       - The distal rectum and anal verge are normal on  ?                       retroflexion view. ?                       -  The examined portion of the ileum was normal. ?Recommendation:        - Discharge patient to home (with escort). ?                       - Resume previous diet PRN. ?                       - Continue present medications. ?                       - Await pathology results. ?                       - Repeat colonoscopy in 7-10 years for surveillance  ?                       based on pathology results. ?Procedure Code(s):     --- Professional --- ?                       [phone], Colonoscopy, flexible; with removal of  ?                       tumor(s), polyp(s), or other lesion(s) by snare  ?                       technique ?Diagnosis Code(s):     --- Professional --- ?                       Z86.010, Personal history of colonic polyps ?                       K63.5, Polyp of colon ?CPT copyright 2019 American Medical Association. All rights reserved. ?The codes documented in this  report are preliminary and upon coder review may  ?be revised to meet current compliance requirements. ?Dr. Ulyess Mort ?Ladena Jacquez Raeanne Gathers MD, MD ?02/27/2022 10:26:49 AM ?This report has been signed electronically. ?Number of Addenda: 0 ?Note Initiated On: 02/27/2022 10:04 AM ?Scope Withdrawal Time: 0 hours 10 minutes 14 seconds  ?Total Procedure Duration: 0 hours 14 minutes 33 seconds  ?Estimated Blood Loss:  Estimated blood loss: none. Estimated blood loss: none. ?     [facility] ?

## 2022-02-27 NOTE — Anesthesia Preprocedure Evaluation (Addendum)
Anesthesia Evaluation  ?Patient identified by MRN, date of birth, ID band ?Patient awake ? ? ? ?Reviewed: ?Allergy & Precautions, NPO status , Patient's Chart, lab work & pertinent test results ? ?Airway ?Mallampati: III ? ?TM Distance: >3 FB ?Neck ROM: full ? ? ? Dental ? ?(+) Chipped,  ?  ?Pulmonary ?sleep apnea , COPD (Centrilobular emphysema),  COPD inhaler, Current Smoker,  ?  ?Pulmonary exam normal ? ? ? ? ? ? ? Cardiovascular ?Exercise Tolerance: Good ?+ CAD and + Past MI (2018)  ?Normal cardiovascular exam ? ?ECHO 2018: ? ?- Left ventricle: The cavity size was normal. Systolic function was  ???mildly reduced. The estimated ejection fraction was in the range  ???of 45% to 50%. Hypokinesis of the basal to mid inferior  ???myocardium. Doppler parameters are consistent with abnormal left  ???ventricular relaxation (grade 1 diastolic dysfunction).  ?- Mitral valve: There was mild regurgitation.  ?- Right ventricle: Systolic function was normal.  ?- Pulmonary arteries: Systolic pressure was within the normal  ???range.  ? ?10/18: ?Normal sinus rhythm ?Nonspecific T wave abnormality ?Abnormal ECG ?  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 GI/Hepatic ?Neg liver ROS, GERD  Controlled and Medicated,  ?Endo/Other  ?negative endocrine ROS ? Renal/GU ?negative Renal ROS  ?negative genitourinary ?  ?Musculoskeletal ? ? Abdominal ?(+) + obese,   ?Peds ? 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 ?Past Surgical History: ?1999: atrium septal defect ?10/17/2018: COLONOSCOPY WITH PROPOFOL; N/A ?    Comment:  Procedure: COLONOSCOPY WITH PROPOFOL;  Surgeon: Marius Ditch,  ?              Tally Due, MD;  Location: [facility] ENDOSCOPY;  Service:  ?             Gastroenterology;  Laterality: N/A; ?No date: CORONARY ANGIOPLASTY ?    Comment:  09/2017 ?10/26/2017: CORONARY/GRAFT ACUTE MI REVASCULARIZATION; N/A ?    Comment:  Procedure: Coronary/Graft Acute MI Revascularization;   ?             Surgeon: Nelva Bush, MD;  Location: [place]  ?             CV LAB;  Service: Cardiovascular;  Laterality: N/A; ?10/17/2018: ESOPHAGOGASTRODUODENOSCOPY (EGD) WITH PROPOFOL; N/A ?    Comment:  Procedure: ESOPHAGOGASTRODUODENOSCOPY (EGD) WITH  ?             PROPOFOL;  Surgeon: Lin Landsman, MD;  Location:  ?             [facility] ENDOSCOPY;  Service: Gastroenterology;  Laterality:  ?             N/A; ?10/26/2017: LEFT HEART CATH AND CORONARY ANGIOGRAPHY; N/A ?    Comment:  Procedure: LEFT HEART CATH AND CORONARY ANGIOGRAPHY;   ?             Surgeon: Nelva Bush, MD;  Location: [place]  ?             CV LAB;  Service: Cardiovascular;  Laterality: N/A; ?No date: WISDOM TOOTH EXTRACTION ?    Comment:  age 56 all 26 ? ? ? ? Reproductive/Obstetrics ?negative OB ROS ? ?  ? ? ? ? ? ? ? ? ? ? ? ? ? ?  ?  ? ? ? ? ? ? ? ?  Anesthesia Physical ?Anesthesia Plan ? ?ASA: 3 ? ?Anesthesia Plan: General  ? ?Post-op Pain Management:   ? ?Induction:  ? ?PONV Risk Score and Plan: Propofol infusion and TIVA ? ?Airway Management Planned: Natural Airway and Simple Face Mask ? ?Additional Equipment:  ? ?Intra-op Plan:  ? ?Post-operative Plan:  ? ?Informed Consent: I have reviewed the patients History and Physical, chart, labs and discussed the procedure including the risks, benefits and alternatives for the proposed anesthesia with the patient or authorized representative who has indicated his/her understanding and acceptance.  ? ? ? ?Dental advisory given ? ?Plan Discussed with: Anesthesiologist, CRNA and Surgeon ? ?Anesthesia Plan Comments:   ? ? ? ? ? ?Anesthesia Quick Evaluation ? ?

## 2022-02-27 NOTE — H&P (Signed)
?Cephas Darby, MD ?[address]  ?[address]  ?[place], [place] [address]  ?Main: [phone]  ?Fax: [phone] ?Pager: [phone] ? ?Primary Care Physician:  Olin Hauser, DO ?Primary Gastroenterologist:  Dr. Cephas Darby ? ?Pre-Procedure History & Physical: ?HPI:  Angela Mccullough is a 56 y.o. female is here for an colonoscopy. ?  ?Past Medical History:  ?Diagnosis Date  ? ASD (atrial septal defect), common atrium (single atrium)   ? Congenital anomaly of heart BIRTH  ? BORN C PVCs  ? Heart attack ([place]) 10/26/2017  ? Heart murmur   ? Kidney stone   ? ? ?Past Surgical History:  ?Procedure Laterality Date  ? atrium septal defect  1999  ? COLONOSCOPY WITH PROPOFOL N/A 10/17/2018  ? Procedure: COLONOSCOPY WITH PROPOFOL;  Surgeon: Lin Landsman, MD;  Location: [facility] ENDOSCOPY;  Service: Gastroenterology;  Laterality: N/A;  ? CORONARY ANGIOPLASTY    ? 09/2017  ? CORONARY/GRAFT ACUTE MI REVASCULARIZATION N/A 10/26/2017  ? Procedure: Coronary/Graft Acute MI Revascularization;  Surgeon: Nelva Bush, MD;  Location: [place] CV LAB;  Service: Cardiovascular;  Laterality: N/A;  ? ESOPHAGOGASTRODUODENOSCOPY (EGD) WITH PROPOFOL N/A 10/17/2018  ? Procedure: ESOPHAGOGASTRODUODENOSCOPY (EGD) WITH PROPOFOL;  Surgeon: Lin Landsman, MD;  Location: [facility] ENDOSCOPY;  Service: Gastroenterology;  Laterality: N/A;  ? LEFT HEART CATH AND CORONARY ANGIOGRAPHY N/A 10/26/2017  ? Procedure: LEFT HEART CATH AND CORONARY ANGIOGRAPHY;  Surgeon: Nelva Bush, MD;  Location: [place] CV LAB;  Service: Cardiovascular;  Laterality: N/A;  ? WISDOM TOOTH EXTRACTION    ? age 2 all 38  ? ? ?Prior to Admission medications   ?Medication Sig Start Date End Date Taking? Authorizing Provider  ?albuterol (VENTOLIN HFA) 108 (90 Base) MCG/ACT inhaler Inhale 2 puffs into the lungs every 6 (six) hours as needed for wheezing or shortness of breath. 03/05/21   Olin Hauser, DO  ?ARIPiprazole  (ABILIFY) 10 MG tablet Take 1 tablet (10 mg total) by mouth daily. 10/20/19   Olin Hauser, DO  ?aspirin 81 MG chewable tablet Chew 1 tablet (81 mg total) by mouth daily. 10/29/17   Fritzi Mandes, MD  ?atorvastatin (LIPITOR) 80 MG tablet Take 80 mg by mouth daily. 09/06/20   [provider]  ?buPROPion (WELLBUTRIN) 75 MG tablet Take 75 mg by mouth daily. 09/04/20   [provider]  ?cetirizine (ZYRTEC) 10 MG tablet Take 10 mg by mouth daily.     [provider]  ?chlorpheniramine-HYDROcodone (TUSSIONEX PENNKINETIC ER) 10-8 MG/5ML SUER Take 5 mLs by mouth every 12 (twelve) hours as needed for cough. 03/05/21   Karamalegos, Devonne Doughty, DO  ?citalopram (CELEXA) 40 MG tablet Take 1 tablet (40 mg total) by mouth daily. Start with half tab daily for 2 weeks then increase to 1 full tab 40mg  daily only for initial rx. 09/19/21   Olin Hauser, DO  ?clindamycin (CLEOCIN T) 1 % lotion Apply to affected area once a day 10/03/20   Adrian Prows R, MD  ?diphenhydrAMINE (BENADRYL) 25 MG tablet Take 25 mg by mouth every 4 (four) hours as needed.    [provider]  ?ENSTILAR 0.005-0.064 % FOAM APPLY TOPICALLY TO THE AFFECTED AREA EVERY DAY 09/22/18   [provider]  ?EUCRISA 2 % OINT APPLY TOPICALLY TO THE AFFECTED AREA EVERY NIGHT AT BEDTIME 09/22/18   [provider]  ?fluticasone (FLONASE) 50 MCG/ACT nasal spray Place into the nose. 08/05/20   [provider]  ?fluticasone (FLONASE) 50 MCG/ACT  nasal spray PLACE 2 SPRAYS INTO BOTH NOSTRILS . USE FOR 4 TO 6 WEEKS THEN STOP AND USE SEASONALLY OR AS NEEDED 09/19/21   Parks Ranger, Devonne Doughty, DO  ?pantoprazole (PROTONIX) 40 MG tablet TAKE 1 TABLET BY MOUTH DAILY BEFORE BREAKFAST 12/03/19   Karamalegos, Devonne Doughty, DO  ?predniSONE (DELTASONE) 50 MG tablet Take 1 tablet (50 mg total) by mouth daily with breakfast. 03/05/21   Olin Hauser, DO  ?Tiotropium Bromide-Olodaterol 2.5-2.5 MCG/ACT AERS  Inhale into the lungs. 08/17/19   [provider]  ? ? ?Allergies as of 01/28/2022 - Review Complete 04/21/2021  ?Allergen Reaction Noted  ? Biaxin [clarithromycin] Nausea And Vomiting 10/14/2015  ? ? ?Family History  ?Problem Relation Age of Onset  ? Diabetes Mother   ?     TYPE 1  ? COPD Mother   ? Kidney Stones Mother   ? Coronary artery disease Mother   ? Heart failure Mother   ? Kidney Stones Father   ? Coronary artery disease Father   ? Colon cancer Father 70  ?     Stage IV  ? Hashimoto's thyroiditis Sister   ? Diabetes Maternal Grandmother   ?     TYPE2  ? Diabetes Maternal Grandfather   ?     TYPE 2  ? Kidney disease Neg Hx   ? Bladder Cancer Neg Hx   ? Breast cancer Neg Hx   ? Cervical cancer Neg Hx   ? ? ?Social History  ? ?Socioeconomic History  ? Marital status: Single  ?  Spouse name: Not on file  ? Number of children: 2  ? Years of education: 53  ? Highest education level: Not on file  ?Occupational History  ? Occupation: TEACHER ASSISTANT  ?  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 Smoking status: Every Day  ?  Packs/day: 0.25  ?  Types: E-cigarettes, Cigarettes  ? Smokeless tobacco: Current  ? Tobacco comments:  ?  down to 4 cigs per day  ?Vaping Use  ? Vaping Use: Never used  ?Substance and Sexual Activity  ? Alcohol use: Yes  ?  Alcohol/week: 0.0 standard drinks  ?  Comment: 4 yrs sober, very little  ? Drug use: Not Currently  ?  Types: Marijuana  ?  Comment: Smoked once early today.   ? Sexual activity: Yes  ?  Birth control/protection: Post-menopausal  ?Other Topics Concern  ? Not on file  ?Social History Narrative  ? Not on file  ? ?Social Determinants of Health  ? ?Financial Resource Strain: Not on file  ?Food Insecurity: Not on file  ?Transportation Needs: Not on file  ?Physical Activity: Not on file  ?Stress: Not on file  ?Social Connections: Not on file  ?Intimate Partner Violence: Not on file  ? ? ?Review of Systems: ?See HPI, otherwise negative ROS ? ?Physical Exam: ?BP 138/80    Pulse 85   Temp 97.6 ?F (36.4 ?C) (Temporal)   Resp 17   Ht 5\' 2"  (1.575 m)   Wt 81.2 kg   SpO2 100%   BMI 32.74 kg/m?  ?General:   Alert,  pleasant and cooperative in NAD ?Head:  Normocephalic and atraumatic. ?Neck:  Supple; no masses or thyromegaly. ?Lungs:  Clear throughout to auscultation.    ?Heart:  Regular rate and rhythm. ?Abdomen:  Soft, nontender and nondistended. Normal bowel sounds, without guarding, and without rebound.   ?Neurologic:  Alert and  oriented x4;  grossly normal neurologically. ? ?Impression/Plan: ?  Angela Mccullough is here for an colonoscopy to be performed for h/o adenomas of the colon ? ?Risks, benefits, limitations, and alternatives regarding  colonoscopy have been reviewed with the patient.  Questions have been answered.  All parties agreeable. ? ? ?Sherri Sear, MD  02/27/2022, 10:03 AM ?

## 2022-03-02 ENCOUNTER — Encounter: Payer: Self-pay | Admitting: Gastroenterology

## 2022-03-02 LAB — SURGICAL PATHOLOGY

## 2024-10-31 ENCOUNTER — Ambulatory Visit
Admission: EM | Admit: 2024-10-31 | Discharge: 2024-10-31 | Disposition: A | Discharge status: Home / Self Care | Attending: Physician Assistant | Admitting: Physician Assistant

## 2024-10-31 DIAGNOSIS — J441 Chronic obstructive pulmonary disease with (acute) exacerbation: Secondary | ICD-10-CM

## 2024-10-31 DIAGNOSIS — R0602 Shortness of breath: Secondary | ICD-10-CM

## 2024-10-31 DIAGNOSIS — R051 Acute cough: Secondary | ICD-10-CM | POA: Diagnosis not present

## 2024-10-31 LAB — POC SOFIA SARS ANTIGEN FIA: SARS Coronavirus 2 Ag: NEGATIVE

## 2024-10-31 MED ORDER — AMOXICILLIN-POT CLAVULANATE 875-125 MG PO TABS: 1.0000 | ORAL_TABLET | Freq: Two times a day (BID) | ORAL | 0 refills | Status: AC

## 2024-10-31 MED ORDER — PREDNISONE 20 MG PO TABS: 40.0000 mg | ORAL_TABLET | Freq: Every day | ORAL | 0 refills | Status: AC

## 2024-10-31 MED ORDER — PROMETHAZINE-DM 6.25-15 MG/5ML PO SYRP: 5.0000 mL | ORAL_SOLUTION | Freq: Four times a day (QID) | ORAL | 0 refills | Status: AC | PRN

## 2024-10-31 NOTE — ED Provider Notes (Signed)
 [facility]    CSN: [id number] Arrival date & time: 10/31/24  0954      History   Chief Complaint Chief Complaint  Patient presents with   Sinus Problem   Cough    HPI Angela Mccullough is a 58 y.o. female with history of COPD, chronic cough, allergies, anxiety/depression, sleep apnea, and tobacco abuse.  Patient reports 4 days history of fatigue, sinus pressure, nasal congestion, and productive cough. Reports increased chest tightness and shortness of breath. Has been using albuterol  more frequently than normal. Denies fever, sore throat, abdominal pain, vomiting and diarrhea. Denies sick contacts. Has been taking OTC meds without relief.  HPI  Past Medical History:  Diagnosis Date   ASD (atrial septal defect), common atrium (single atrium)    Congenital anomaly of heart BIRTH   BORN C PVCs   Heart attack ([facility]) 10/26/2017   Heart murmur    Kidney stone     Patient Active Problem List   Diagnosis Date Noted   History of colonic polyps    Polyp of sigmoid colon    Centrilobular emphysema (HCC) 07/17/2019   Abnormal ECG 04/03/2019   Obesity (BMI 35.0-39.9 without comorbidity) 02/15/2019   OSA (obstructive sleep apnea) 02/15/2019   Positive colorectal cancer screening using Cologuard test    Cigarette smoker 07/31/2018   Hyperlipidemia 07/18/2018   Weight gain 06/23/2018   Coronary artery disease involving native coronary artery of native heart with angina pectoris 11/03/2017   History of ST elevation myocardial infarction (STEMI) 10/26/2017   Chronic low back pain with right-sided sciatica 11/02/2016   Allergic rhinitis 03/23/2016   Chronic fatigue 01/20/2016   Constipation 01/20/2016   PTSD (post-traumatic stress disorder) 11/20/2015   Chronic cough 11/20/2015   Nicotine dependence 10/03/2015   Depression with anxiety 07/09/2009   GERD 07/09/2009   DYSFUNCTIONAL UTERINE BLEEDING 07/09/2009   NEPHROLITHIASIS, HX OF 07/09/2009    Past Surgical  History:  Procedure Laterality Date   atrium septal defect  1999   COLONOSCOPY WITH PROPOFOL  N/A 10/17/2018   Procedure: COLONOSCOPY WITH PROPOFOL ;  Surgeon: Unk Corinn Skiff, MD;  Location: [facility] ENDOSCOPY;  Service: Gastroenterology;  Laterality: N/A;   COLONOSCOPY WITH PROPOFOL  N/A 02/27/2022   Procedure: COLONOSCOPY WITH PROPOFOL ;  Surgeon: Unk Corinn Skiff, MD;  Location: [facility] ENDOSCOPY;  Service: Gastroenterology;  Laterality: N/A;   CORONARY ANGIOPLASTY     09/2017   CORONARY/GRAFT ACUTE MI REVASCULARIZATION N/A 10/26/2017   Procedure: Coronary/Graft Acute MI Revascularization;  Surgeon: Mady Bruckner, MD;  Location: [facility] INVASIVE CV LAB;  Service: Cardiovascular;  Laterality: N/A;   ESOPHAGOGASTRODUODENOSCOPY (EGD) WITH PROPOFOL  N/A 10/17/2018   Procedure: ESOPHAGOGASTRODUODENOSCOPY (EGD) WITH PROPOFOL ;  Surgeon: Unk Corinn Skiff, MD;  Location: [facility] ENDOSCOPY;  Service: Gastroenterology;  Laterality: N/A;   LEFT HEART CATH AND CORONARY ANGIOGRAPHY N/A 10/26/2017   Procedure: LEFT HEART CATH AND CORONARY ANGIOGRAPHY;  Surgeon: Mady Bruckner, MD;  Location: [facility] INVASIVE CV LAB;  Service: Cardiovascular;  Laterality: N/A;   WISDOM TOOTH EXTRACTION     age 85 all four    OB History     Gravida  2   Para  2   Term  2   Preterm      AB      Living  2      SAB      IAB      Ectopic      Multiple      Live Births  2  Home Medications    Prior to Admission medications   Medication Sig Start Date End Date Taking? Authorizing Provider  amoxicillin -clavulanate (AUGMENTIN) 875-125 MG tablet Take 1 tablet by mouth every 12 (twelve) hours for 7 days. 10/31/24 11/07/24 Yes Arvis Huxley B, PA-C  predniSONE  (DELTASONE ) 20 MG tablet Take 2 tablets (40 mg total) by mouth daily for 5 days. 10/31/24 11/05/24 Yes Arvis Huxley NOVAK, PA-C  promethazine-dextromethorphan (PROMETHAZINE-DM) 6.25-15 MG/5ML syrup Take 5 mLs by mouth 4 (four) times daily as  needed. 10/31/24  Yes Arvis Huxley NOVAK, PA-C  albuterol  (VENTOLIN  HFA) 108 (90 Base) MCG/ACT inhaler Inhale 2 puffs into the lungs every 6 (six) hours as needed for wheezing or shortness of breath. 03/05/21   Karamalegos, Marsa PARAS, DO  ARIPiprazole  (ABILIFY ) 10 MG tablet Take 1 tablet (10 mg total) by mouth daily. 10/20/19   Karamalegos, Marsa PARAS, DO  aspirin  81 MG chewable tablet Chew 1 tablet (81 mg total) by mouth daily. 10/29/17   Patel, Sona, MD  atorvastatin  (LIPITOR) 80 MG tablet Take 80 mg by mouth daily. 09/06/20   [provider]  buPROPion (WELLBUTRIN) 75 MG tablet Take 75 mg by mouth daily. 09/04/20   [provider]  cetirizine (ZYRTEC) 10 MG tablet Take 10 mg by mouth daily.     [provider]  citalopram  (CELEXA ) 40 MG tablet Take 1 tablet (40 mg total) by mouth daily. Start with half tab daily for 2 weeks then increase to 1 full tab 40mg  daily only for initial rx. 09/19/21   Edman, Marsa PARAS, DO  clindamycin  (CLEOCIN  T) 1 % lotion Apply to affected area once a day 10/03/20   Schuman, Christanna R, MD  diphenhydrAMINE (BENADRYL) 25 MG tablet Take 25 mg by mouth every 4 (four) hours as needed.    [provider]  ENSTILAR 0.005-0.064 % FOAM APPLY TOPICALLY TO THE AFFECTED AREA EVERY DAY 09/22/18   [provider]  EUCRISA 2 % OINT APPLY TOPICALLY TO THE AFFECTED AREA EVERY NIGHT AT BEDTIME 09/22/18   [provider]  fluticasone  (FLONASE ) 50 MCG/ACT nasal spray PLACE 2 SPRAYS INTO BOTH NOSTRILS . USE FOR 4 TO 6 WEEKS THEN STOP AND USE SEASONALLY OR AS NEEDED 09/19/21   Edman, Marsa PARAS, DO  pantoprazole  (PROTONIX ) 40 MG tablet TAKE 1 TABLET BY MOUTH DAILY BEFORE BREAKFAST 12/03/19   Karamalegos, Marsa PARAS, DO  Tiotropium Bromide-Olodaterol 2.5-2.5 MCG/ACT AERS Inhale into the lungs. 08/17/19   [provider]    Family History Family History  Problem Relation Age of Onset   Diabetes Mother        TYPE 1    COPD Mother    Kidney Stones Mother    Coronary artery disease Mother    Heart failure Mother    Kidney Stones Father    Coronary artery disease Father    Colon cancer Father 58       Stage IV   Hashimoto's thyroiditis Sister    Diabetes Maternal Grandmother        TYPE2   Diabetes Maternal Grandfather        TYPE 2   Kidney disease Neg Hx    Bladder Cancer Neg Hx    Breast cancer Neg Hx    Cervical cancer Neg Hx     Social History Social History   Tobacco Use   Smoking status: Every Day    Current packs/day: 0.25    Types: E-cigarettes, Cigarettes   Smokeless tobacco: Current  Tobacco comments:    down to 4 cigs per day  Vaping Use   Vaping status: Never Used  Substance Use Topics   Alcohol use: Yes    Alcohol/week: 0.0 standard drinks of alcohol    Comment: 4 yrs sober, very little   Drug use: Not Currently    Types: Marijuana    Comment: Smoked once early today.      Allergies   Biaxin [clarithromycin]   Review of Systems Review of Systems  Constitutional:  Positive for fatigue. Negative for chills, diaphoresis and fever.  HENT:  Positive for congestion, rhinorrhea and sinus pressure. Negative for ear pain and sore throat.   Respiratory:  Positive for cough, chest tightness and shortness of breath.   Cardiovascular:  Negative for chest pain.  Gastrointestinal:  Negative for abdominal pain, nausea and vomiting.  Musculoskeletal:  Negative for arthralgias and myalgias.  Skin:  Negative for rash.  Neurological:  Negative for weakness and headaches.  Hematological:  Negative for adenopathy.     Physical Exam Triage Vital Signs ED Triage Vitals  Encounter Vitals Group     BP      Girls Systolic BP Percentile      Girls Diastolic BP Percentile      Boys Systolic BP Percentile      Boys Diastolic BP Percentile      Pulse      Resp      Temp      Temp src      SpO2      Weight      Height      Head Circumference      Peak Flow      Pain Score       Pain Loc      Pain Education      Exclude from Growth Chart    No data found.  Updated Vital Signs BP (!) 146/79 (BP Location: Left Arm)   Pulse 79   Temp 97.8 F (36.6 C) (Oral)   Resp 18   Wt 189 lb 11.2 oz (86 kg)   SpO2 97%   BMI 34.70 kg/m      Physical Exam Vitals and nursing note reviewed.  Constitutional:      General: She is not in acute distress.    Appearance: Normal appearance. She is not ill-appearing or toxic-appearing.  HENT:     Head: Normocephalic and atraumatic.     Nose: Congestion present.     Mouth/Throat:     Mouth: Mucous membranes are moist.     Pharynx: Oropharynx is clear.  Eyes:     General: No scleral icterus.       Right eye: No discharge.        Left eye: No discharge.     Conjunctiva/sclera: Conjunctivae normal.  Cardiovascular:     Rate and Rhythm: Normal rate and regular rhythm.     Heart sounds: Normal heart sounds.  Pulmonary:     Effort: Pulmonary effort is normal. No respiratory distress.     Breath sounds: Normal breath sounds.  Musculoskeletal:     Cervical back: Neck supple.  Skin:    General: Skin is dry.  Neurological:     General: No focal deficit present.     Mental Status: She is alert. Mental status is at baseline.     Motor: No weakness.     Gait: Gait normal.  Psychiatric:        Mood and Affect: Mood  normal.        Behavior: Behavior normal.      UC Treatments / Results  Labs (all labs ordered are listed, but only abnormal results are displayed) Labs Reviewed  POC SOFIA SARS ANTIGEN FIA - Normal    EKG   Radiology No results found.  Procedures Procedures (including critical care time)  Medications Ordered in UC Medications - No data to display  Initial Impression / Assessment and Plan / UC Course  I have reviewed the triage vital signs and the nursing notes.  Pertinent labs & imaging results that were available during my care of the patient were reviewed by me and considered in my  medical decision making (see chart for details).   58 y/o female with history of tobacco abuse, COPD, chronic cough, allergies, anxiety/depression/PTSD, and OSA presents for 4 day history of fatigue, sinus pressure, nasal congestion, chest tightness, shortness of breath, and productive cough. Has been using inhalers more than normal.  She is afebrile and overall well appearing. NAD. On exam, has nasal congestion. Throat clear. Chest clear and heart RRR.   COVID test obtained. Negative.   COPD exacerbation. Sent prednisone , Augmentin, and promethazine DM. Reviewed supportive care. Discussed continuing with inhalers. Reviewed return and ED precautions.  Acute exacerbation of chronic condition.    Final Clinical Impressions(s) / UC Diagnoses   Final diagnoses:  Acute cough  COPD exacerbation (HCC)  Shortness of breath     Discharge Instructions      - Negative COVID test. - Continue inhalers at home.  I sent medication for COPD flareup. - Increase rest and fluids. - If fever, acute worsening of symptoms or increased breathing difficulty/chest pain go to ER.     ED Prescriptions     Medication Sig Dispense Auth. Provider   predniSONE  (DELTASONE ) 20 MG tablet Take 2 tablets (40 mg total) by mouth daily for 5 days. 10 tablet Arvis Jolan NOVAK, PA-C   amoxicillin -clavulanate (AUGMENTIN) 875-125 MG tablet Take 1 tablet by mouth every 12 (twelve) hours for 7 days. 14 tablet Arvis Jolan NOVAK, PA-C   promethazine-dextromethorphan (PROMETHAZINE-DM) 6.25-15 MG/5ML syrup Take 5 mLs by mouth 4 (four) times daily as needed. 118 mL Arvis Jolan NOVAK, PA-C      I have reviewed the PDMP during this encounter.   Arvis Jolan NOVAK, PA-C 10/31/24 1048

## 2024-10-31 NOTE — Discharge Instructions (Addendum)
-   Negative COVID test. - Continue inhalers at home.  I sent medication for COPD flareup. - Increase rest and fluids. - If fever, acute worsening of symptoms or increased breathing difficulty/chest pain go to ER.

## 2024-10-31 NOTE — ED Triage Notes (Signed)
 Pt c/o sinus pressure,congestion & coughing x4 days. Has tried OTC meds w/o relief. Hx of emphysema.
